# Patient Record
Sex: Female | Born: 1960 | Hispanic: Yes | Smoking: Never smoker
Health system: Southern US, Community
[De-identification: ages and names within clinical notes are randomized; demographics above are authoritative.]

## PROBLEM LIST (undated history)

## (undated) DIAGNOSIS — R7303 Prediabetes: Secondary | ICD-10-CM

## (undated) DIAGNOSIS — K760 Fatty (change of) liver, not elsewhere classified: Secondary | ICD-10-CM

## (undated) HISTORY — PX: APPENDECTOMY: SHX54

## (undated) HISTORY — PX: ABDOMINAL HYSTERECTOMY: SHX81

## (undated) HISTORY — PX: LAPAROSCOPIC HYSTERECTOMY: SHX1926

## (undated) HISTORY — DX: Prediabetes: R73.03

## (undated) HISTORY — PX: LAPAROSCOPIC OOPHERECTOMY: SHX6507

## (undated) HISTORY — DX: Fatty (change of) liver, not elsewhere classified: K76.0

---

## 2004-12-26 ENCOUNTER — Ambulatory Visit: Payer: Self-pay | Admitting: Obstetrics and Gynecology

## 2006-04-25 ENCOUNTER — Ambulatory Visit: Payer: Self-pay | Admitting: Gastroenterology

## 2006-04-25 IMAGING — US US PELV - US TRANSVAGINAL
1 series · 14 of 25 positions shown · non-contrast
Comparison: none

REASON FOR EXAM: epigastric pain, LEFT lower quadrant/LEFT upper quadrant
pain
COMMENTS:

[Series 1: us pelv - us transvaginal · 0.33mm/px · 14 of 37 slices shown]
[im 1/37]
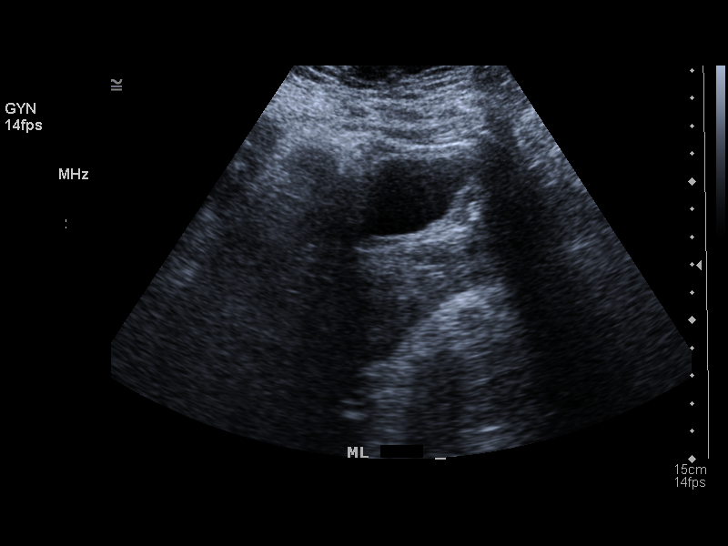
[im 4/37]
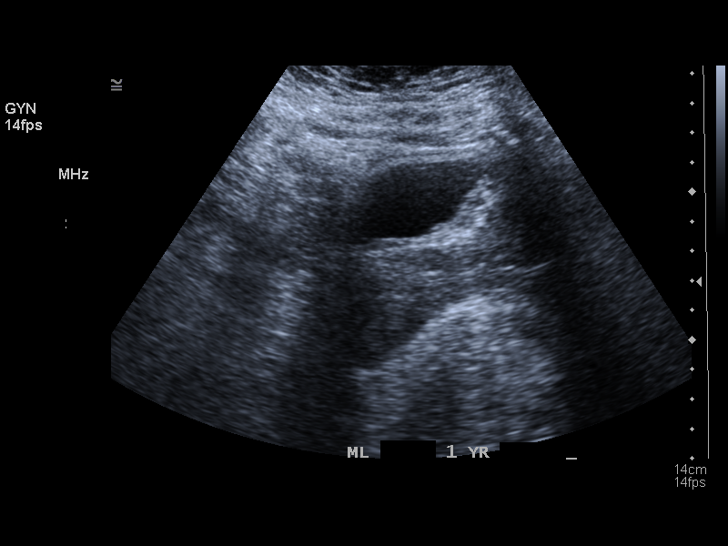
[im 7/37]
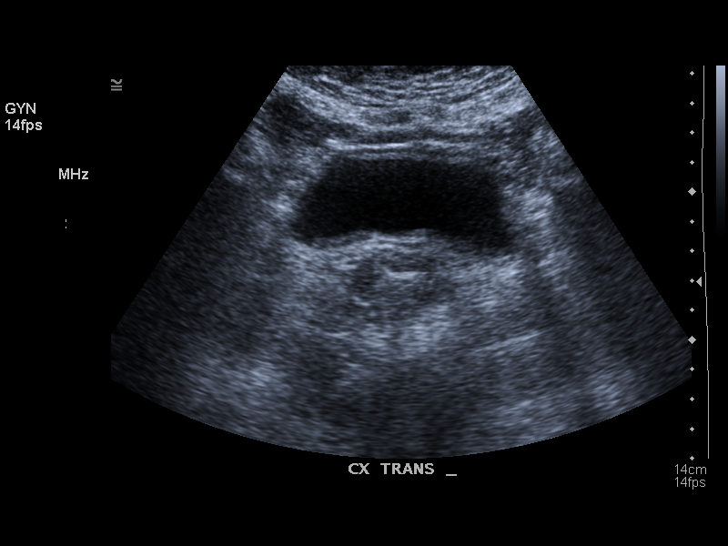
[im 10/37]
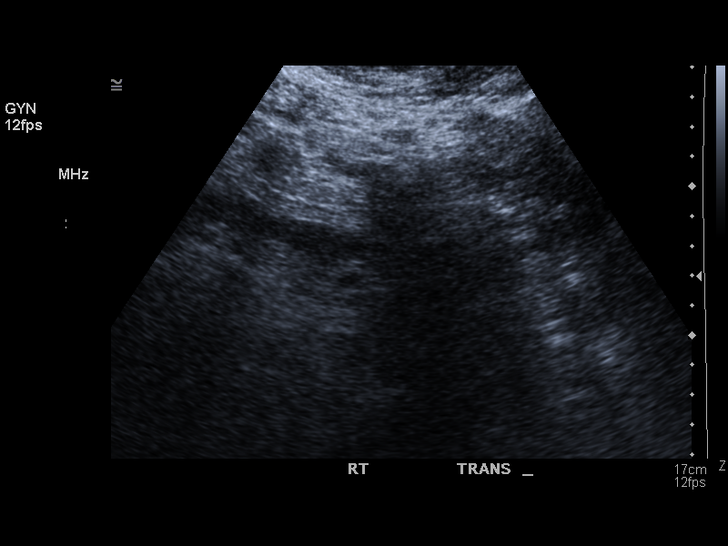
[im 13/37]
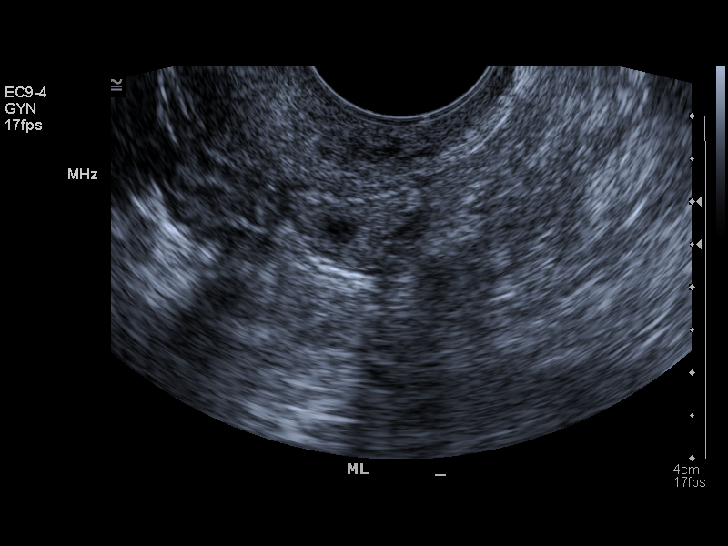
[im 14/37]
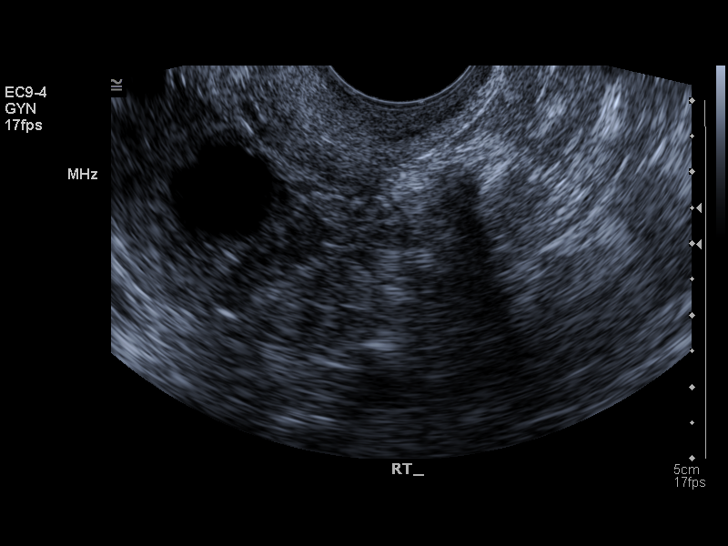
[im 17/37]
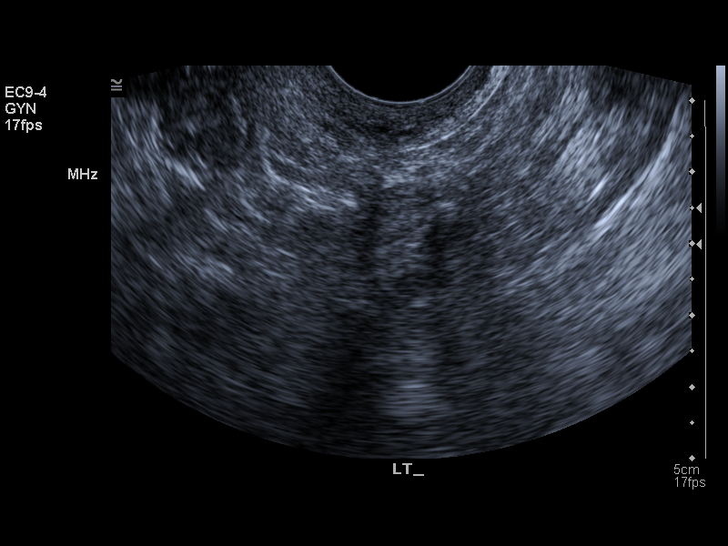
[im 20/37]
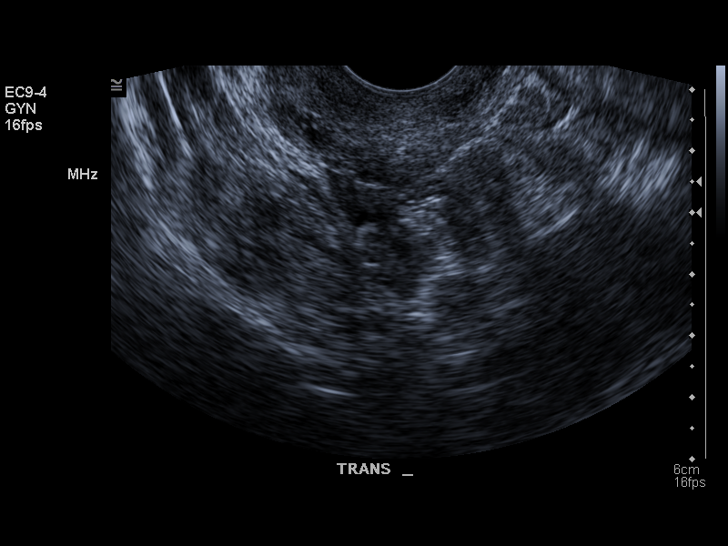
[im 23/37]
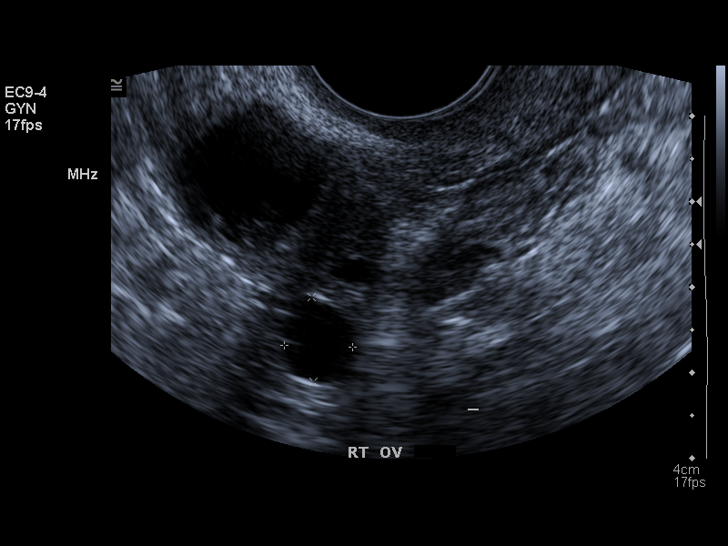
[im 25/37]
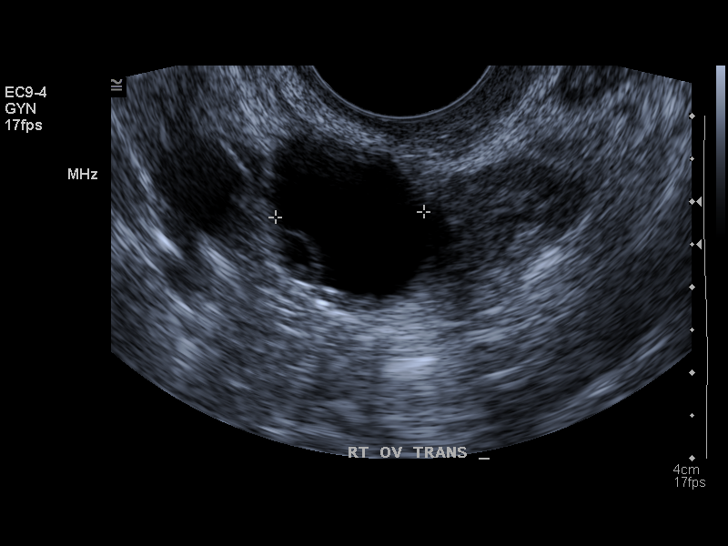
[im 28/37]
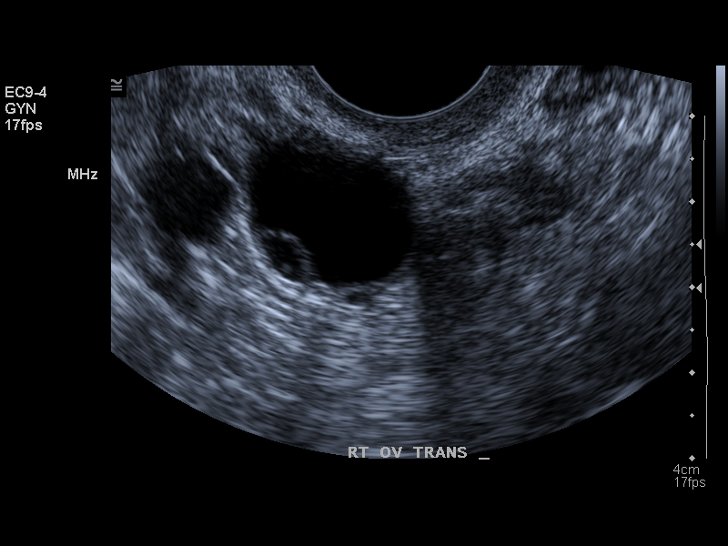
[im 31/37]
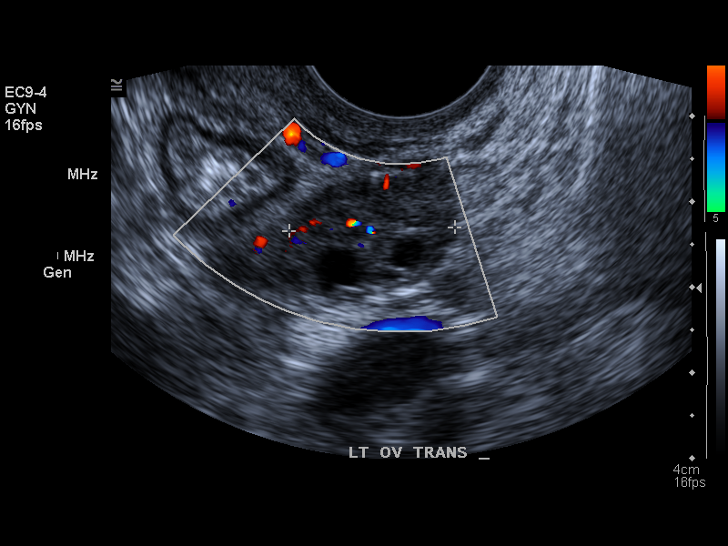
[im 34/37]
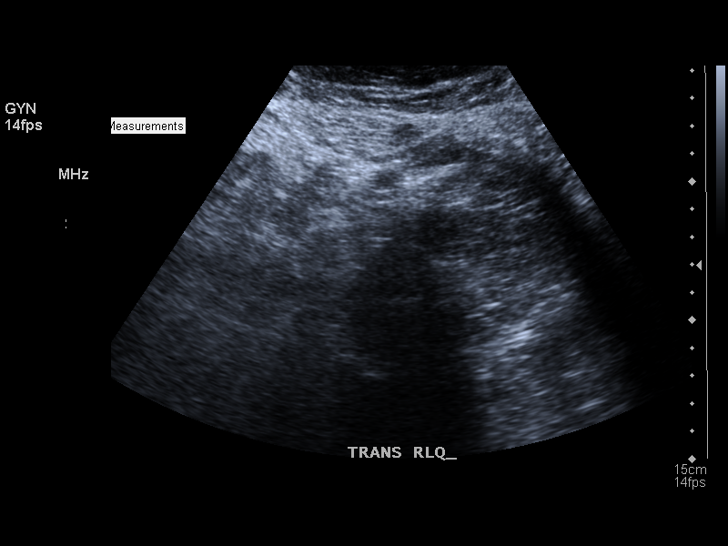
[im 37/37]
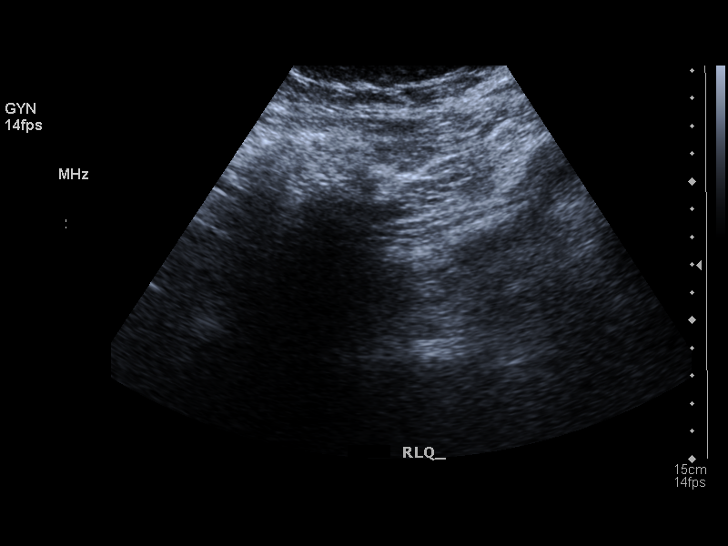

[14 of 25 positions shown; findings below may reference images not displayed]

PROCEDURE:     US  - US PELVIS MASS EXAM  - [DATE] [DATE] [DATE] [DATE]

RESULT:          The uterus is not seen compatible with prior hysterectomy.
The RIGHT ovary measures 3.7 cm at maximum diameter.  Within the RIGHT
ovary, there is noted a septated 1.88 cm cyst and a second smaller cyst
measuring 9 mm at maximum diameter.  The LEFT ovary measures 2.58 cm at
maximum diameter.  A few follicular cysts are seen in the LEFT ovary.  There
is no free fluid observed in the pelvis.
IMPRESSION: 1.     The patient is status post hysterectomy.
2.     There are observed two cysts of the RIGHT ovary as noted above.
3.     There is no ascites.

## 2006-04-25 IMAGING — US ABDOMEN ULTRASOUND
1 series · 14 of 25 positions shown · non-contrast
Comparison: none

REASON FOR EXAM: epigastric pain, LLQ LUQ pain
COMMENTS:

[Series 1: abdomen ultrasound · 0.29mm/px · 14 of 57 slices shown]
[im 1/57]
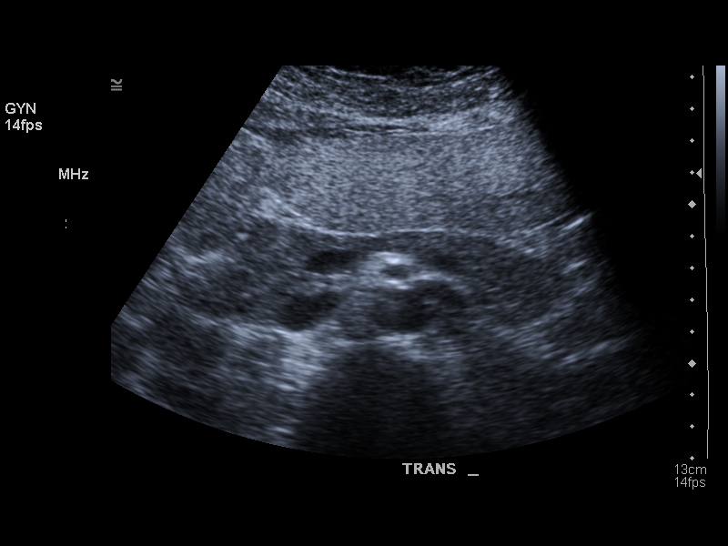
[im 5/57]
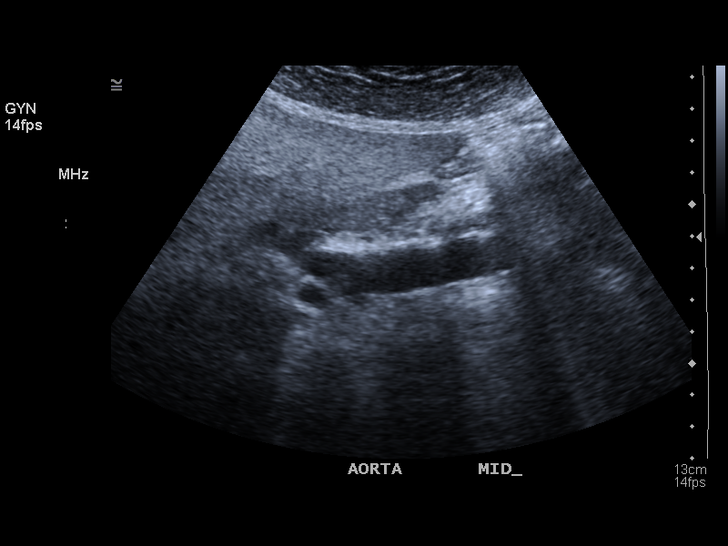
[im 10/57]
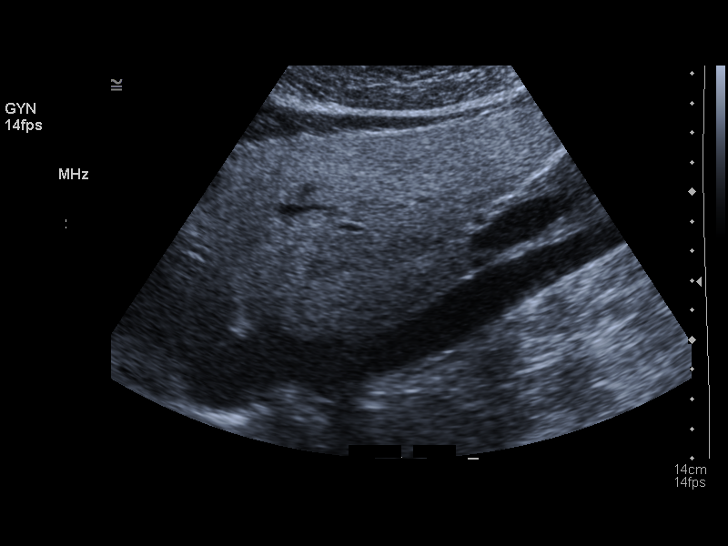
[im 15/57]
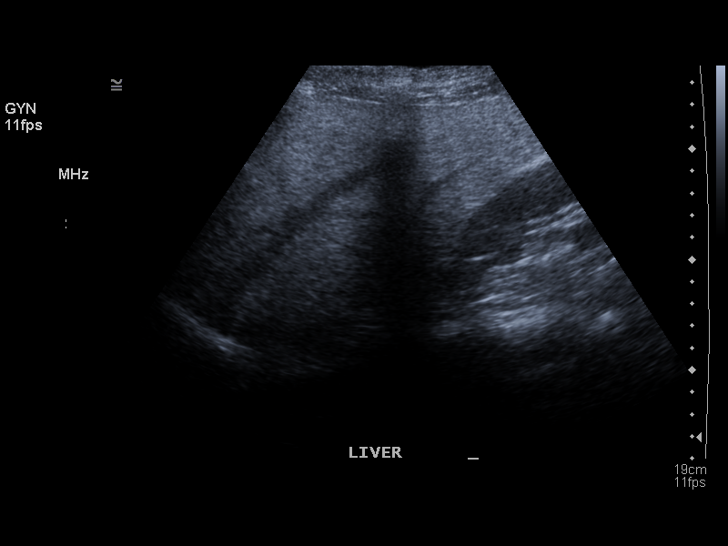
[im 19/57]
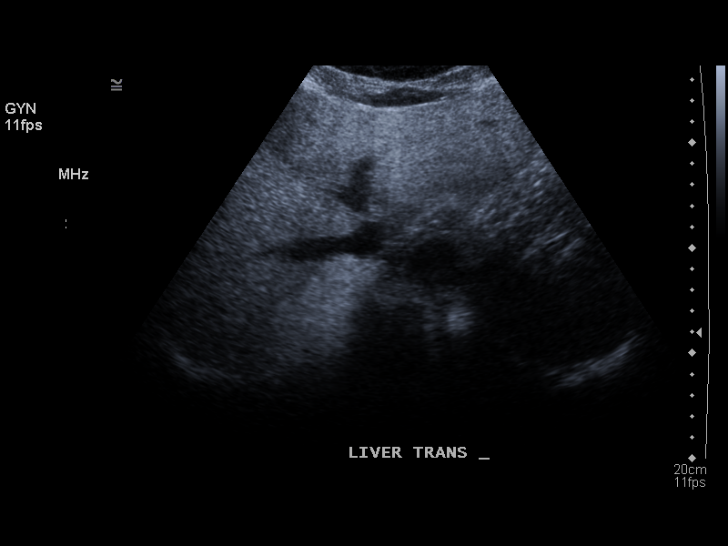
[im 22/57]
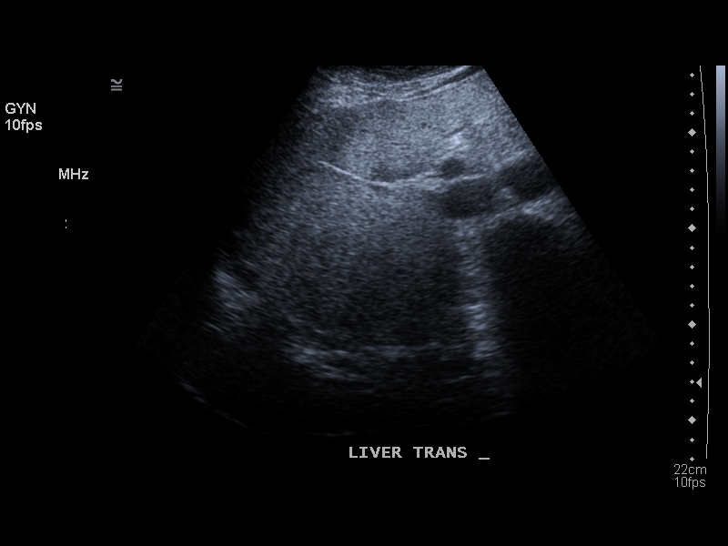
[im 26/57]
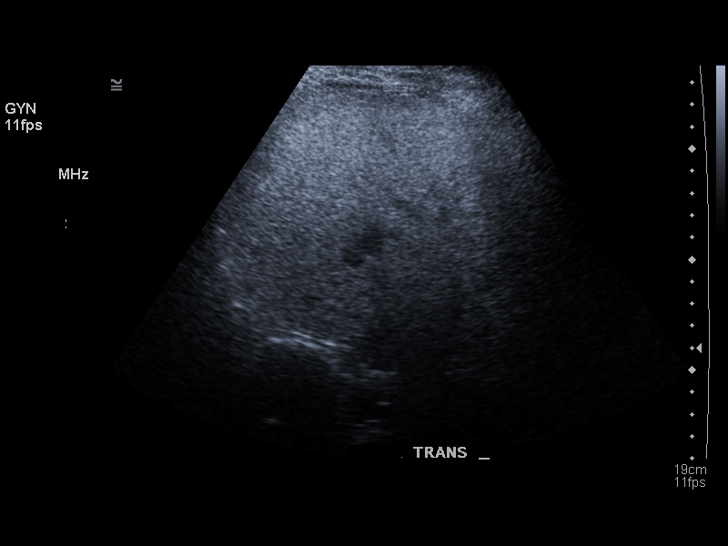
[im 31/57]
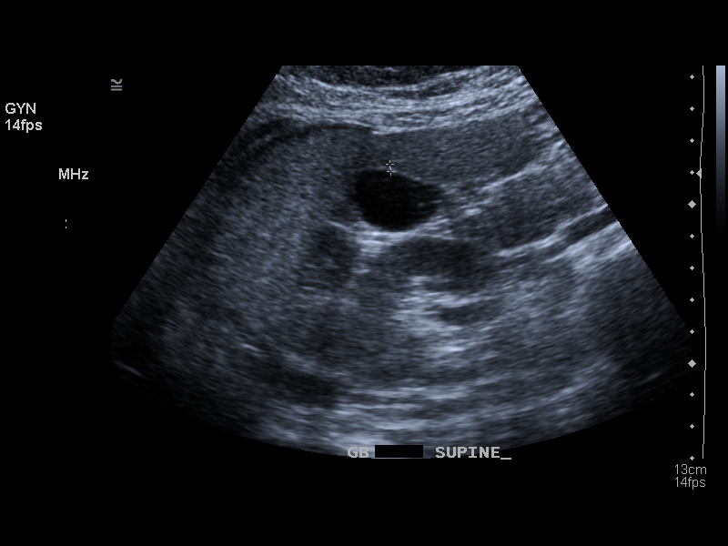
[im 36/57]
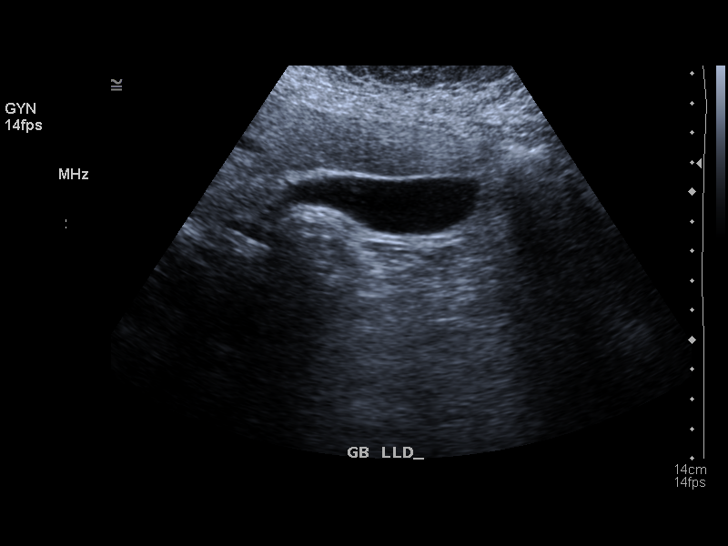
[im 38/57]
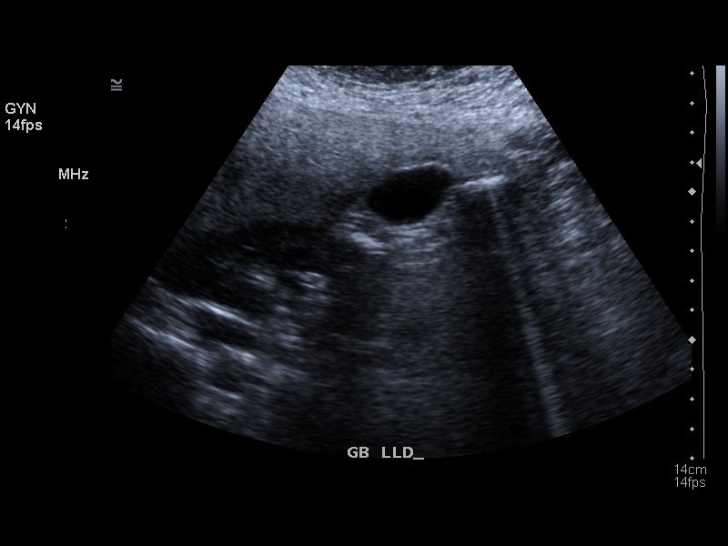
[im 43/57]
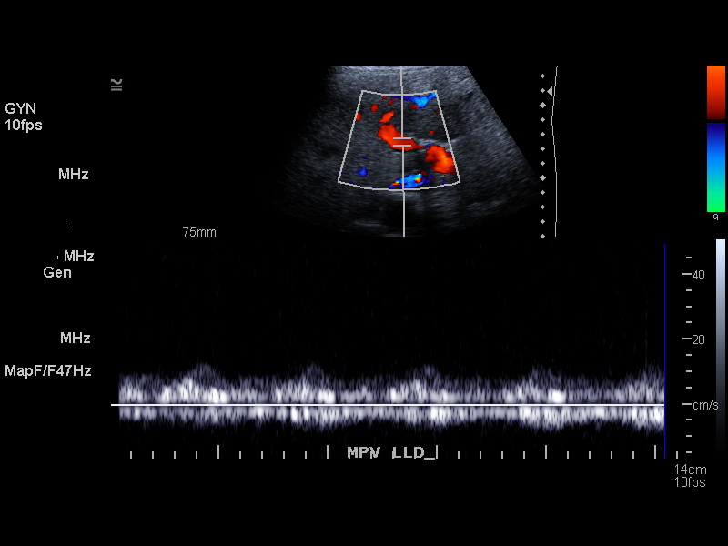
[im 47/57]
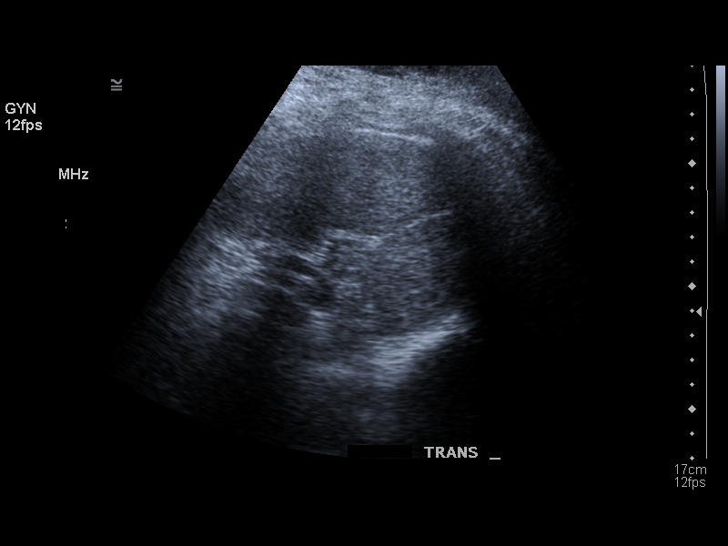
[im 52/57]
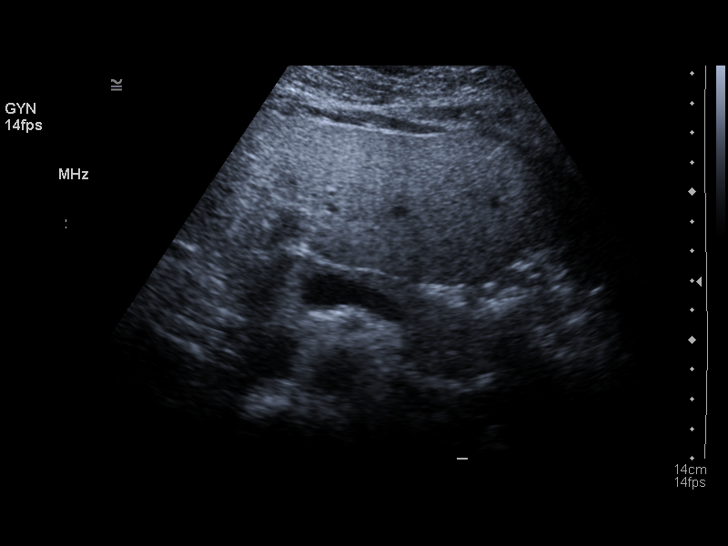
[im 57/57]
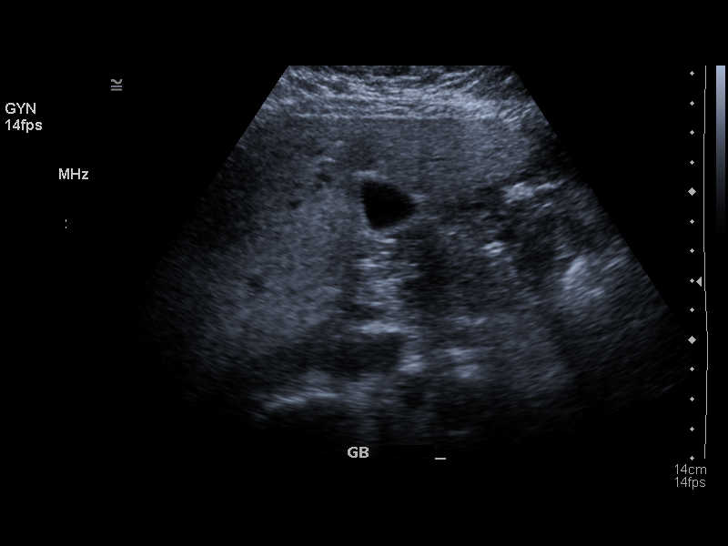

[14 of 25 positions shown; findings below may reference images not displayed]

PROCEDURE:     US  - US ABDOMEN GENERAL SURVEY  - [DATE] [DATE]

RESULT:          The liver, spleen, pancreas, and abdominal aorta are
visualized and are normal in appearance.  No gallstones are seen.  There is
no thickening of the gallbladder wall.  The common bile duct measures 3.7 mm
at maximum diameter.  The kidneys show no hydronephrosis.  There is no
ascites.
IMPRESSION: No significant abnormalities are noted.

## 2006-05-04 ENCOUNTER — Ambulatory Visit: Payer: Self-pay | Admitting: Gastroenterology

## 2008-06-25 ENCOUNTER — Observation Stay: Payer: Self-pay | Admitting: Internal Medicine

## 2008-06-25 ENCOUNTER — Other Ambulatory Visit: Payer: Self-pay

## 2008-06-25 IMAGING — CR DG CHEST 1V PORT
1 series · 1 of 1 positions shown · non-contrast
Comparison: none

REASON FOR EXAM: chest pain - ed [HOSPITAL]
COMMENTS:

PROCEDURE:     DXR - DXR PORTABLE CHEST SINGLE VIEW  - [DATE] [DATE]
RESULT:     The patient has taken a poor inspiration effort. Mild bibasilar
atelectasis is present. Bibasilar pneumonia cannot be excluded.  Follow-up
PA and lateral chest x-rays may prove useful.  The heart size is normal.

[view not recorded]
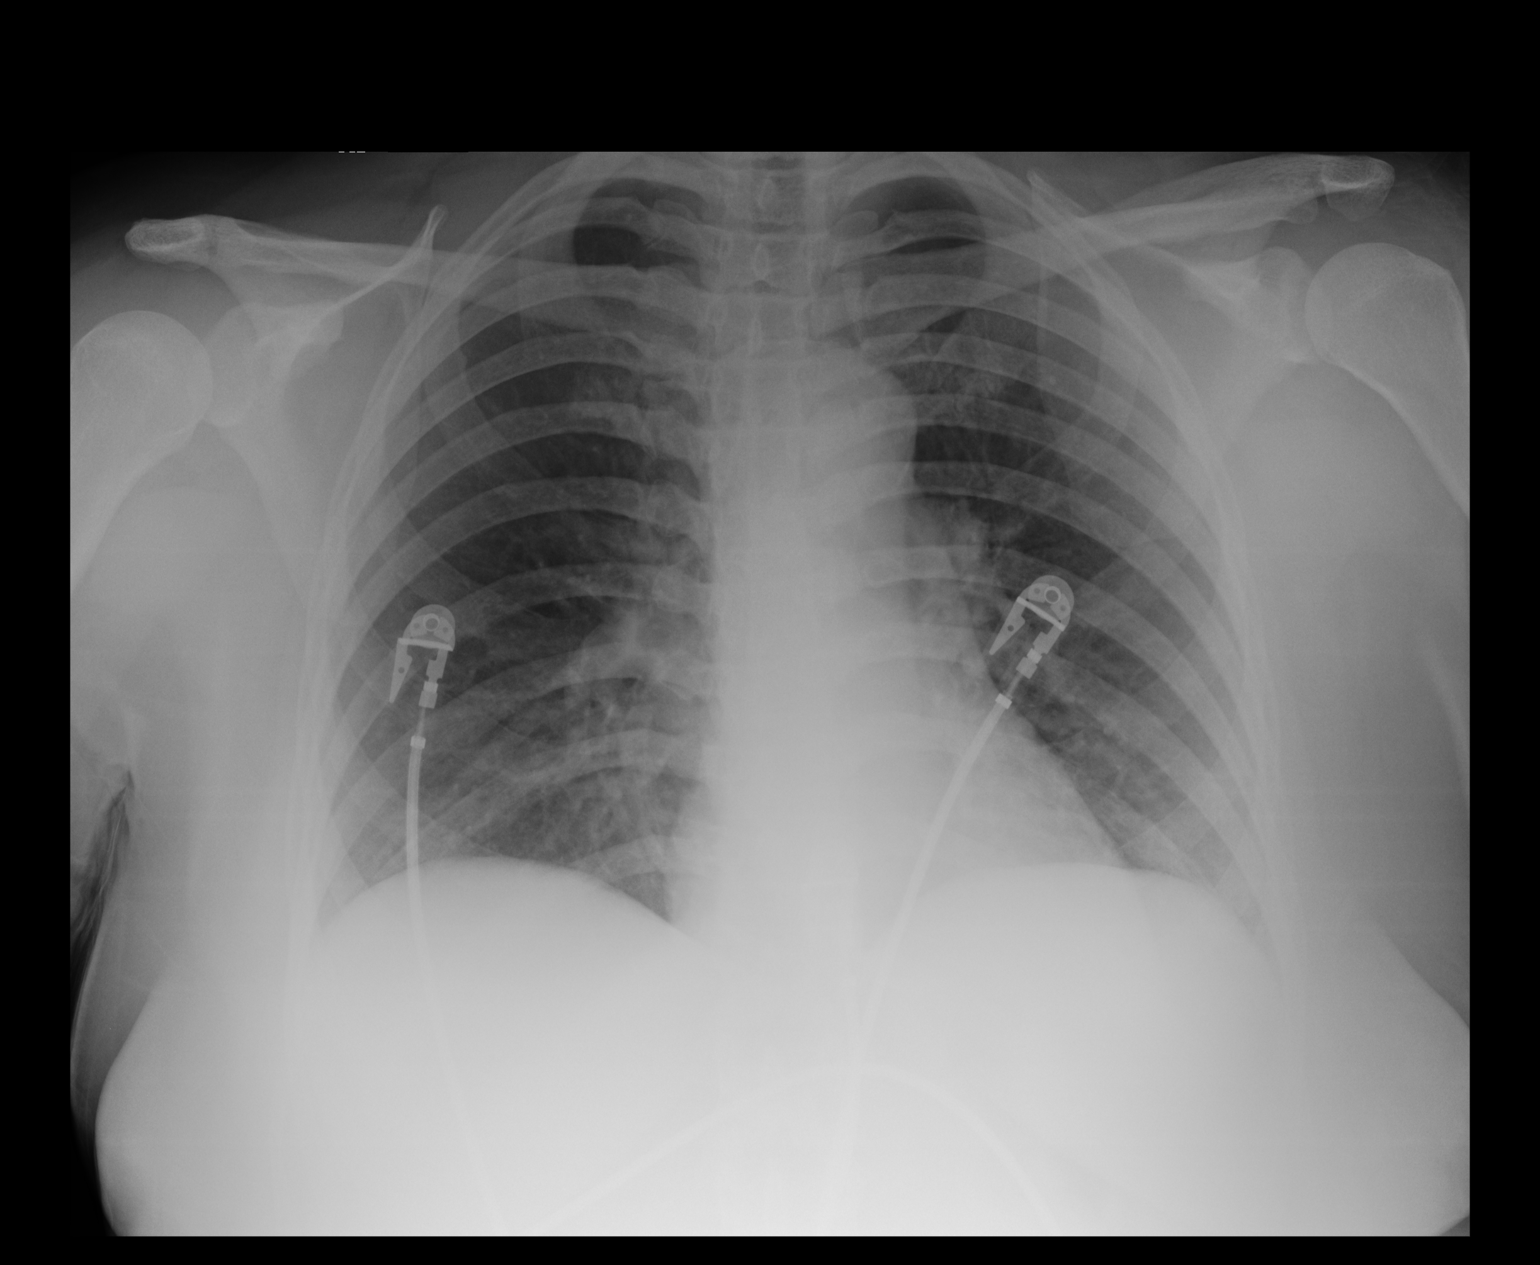

[1 of 1 positions shown; findings below may reference images not displayed]

IMPRESSION: Mild bibasilar atelectasis versus infiltrates.  The patient has taken a poor
inspiration effort. Follow-up chest x-rays may prove useful.

## 2008-06-25 IMAGING — US ABDOMEN ULTRASOUND
1 series · 17 of 25 positions shown · non-contrast
Comparison: none

REASON FOR EXAM: RUQ pain
COMMENTS:

PROCEDURE:     US  - US ABDOMEN GENERAL SURVEY  - [DATE]  [DATE]
RESULT:
HISTORY: RIGHT upper quadrant pain.
COMPARISON STUDIES:  Prior ultrasound of [DATE].

[Series 1: abdomen ultrasound · 17 of 53 slices shown]
[im 1/53]
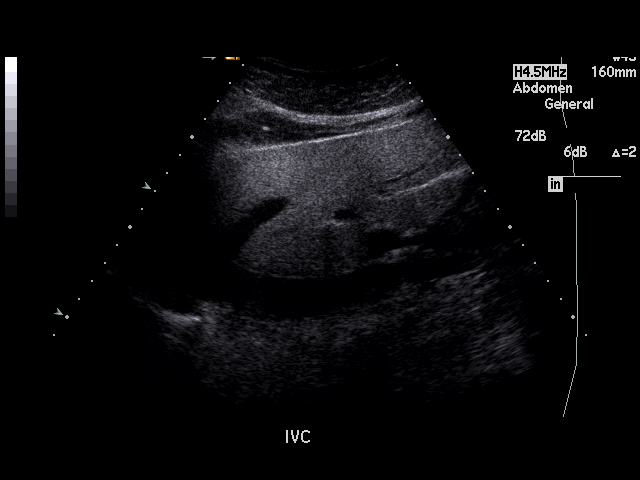
[im 5/53]
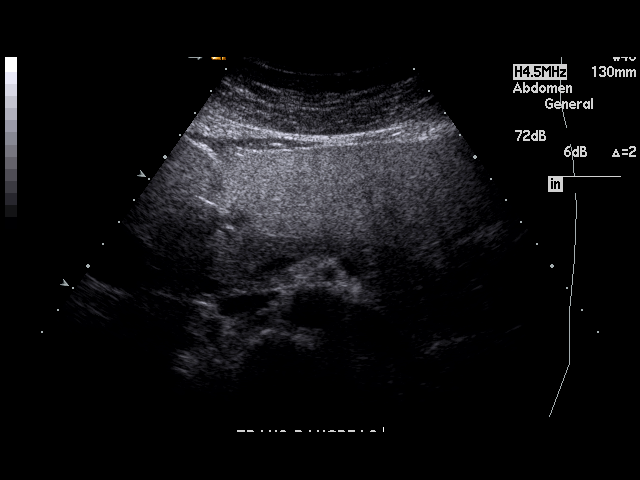
[im 7/53]
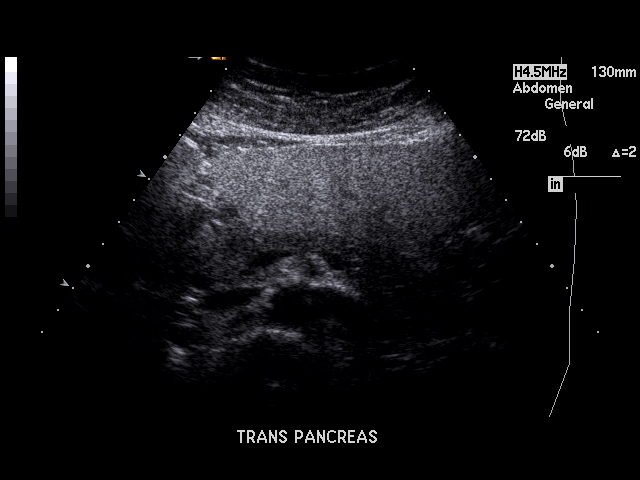
[im 11/53]
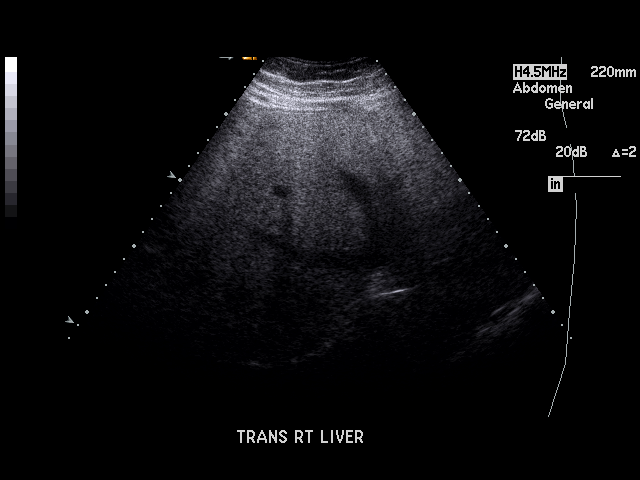
[im 14/53]
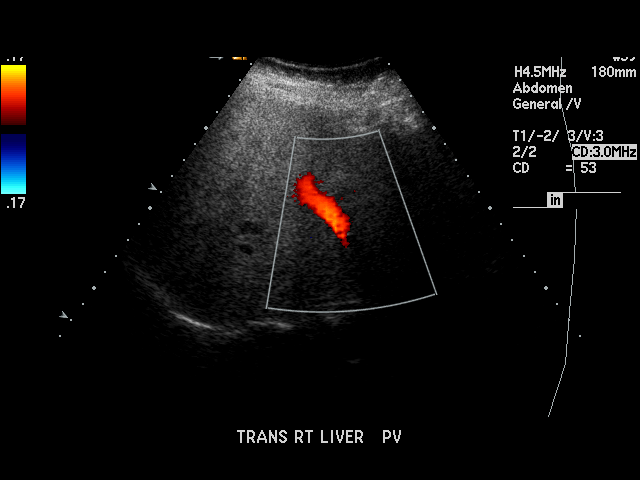
[im 18/53]
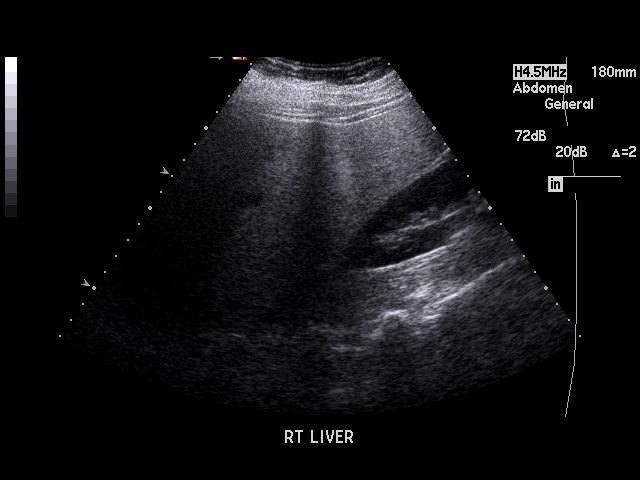
[im 20/53]
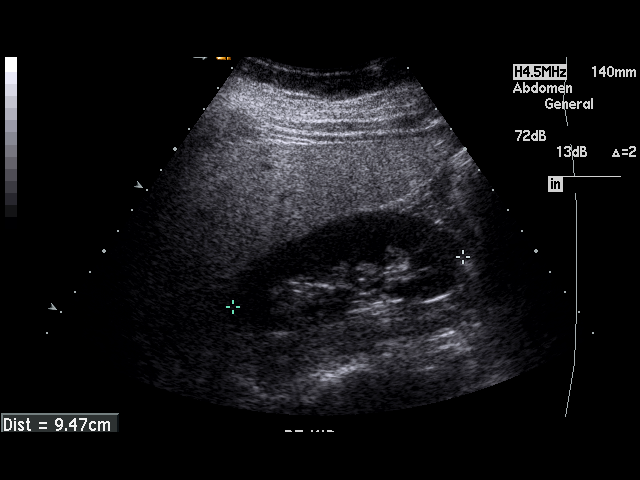
[im 24/53]
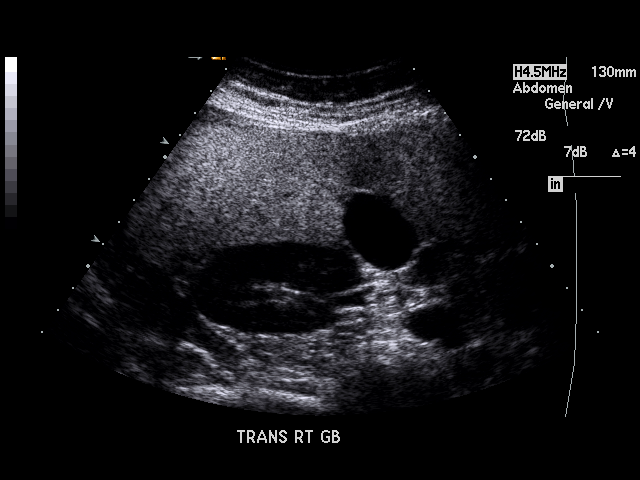
[im 27/53]
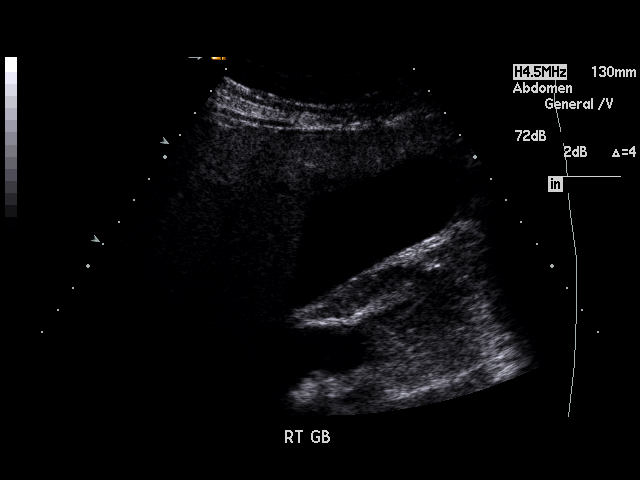
[im 29/53]
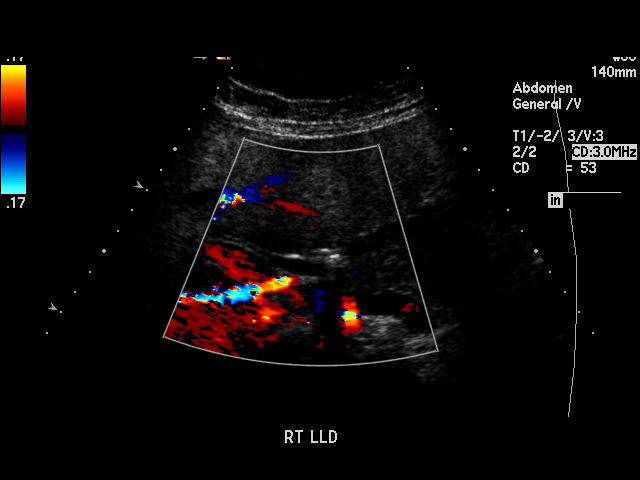
[im 33/53]
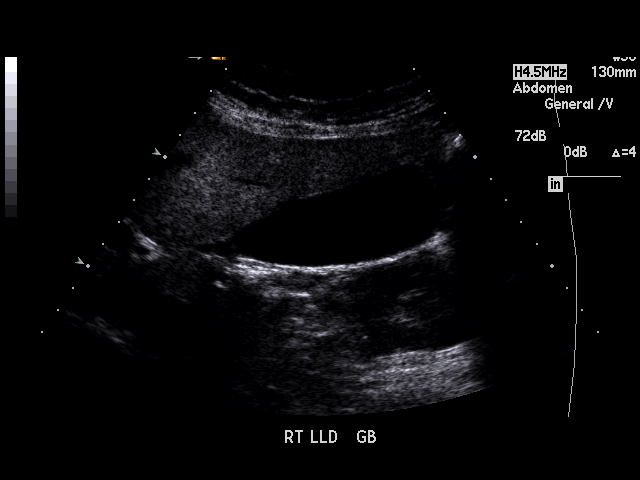
[im 35/53]
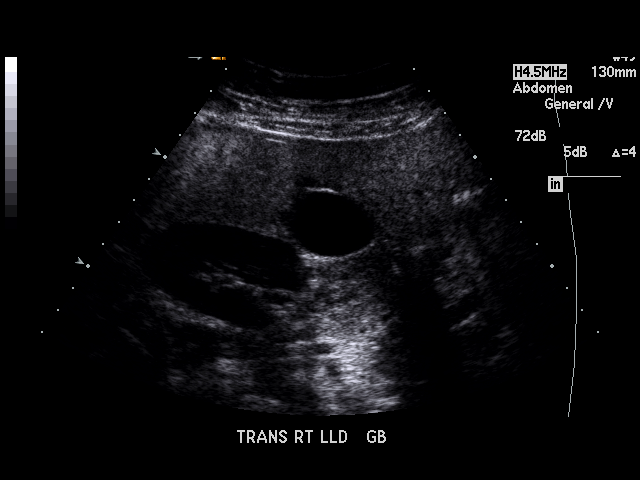
[im 40/53]
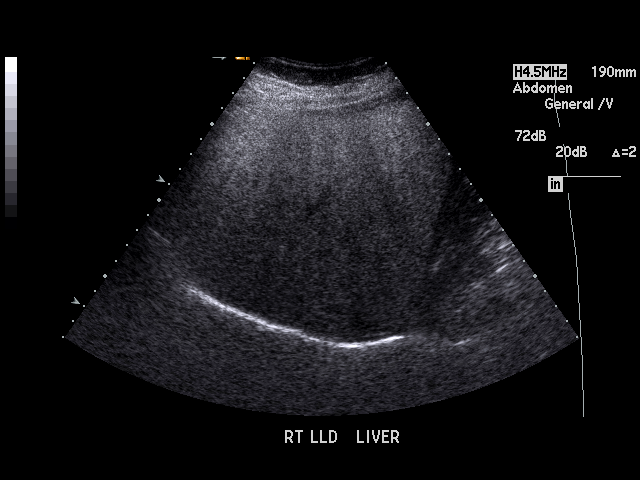
[im 42/53]
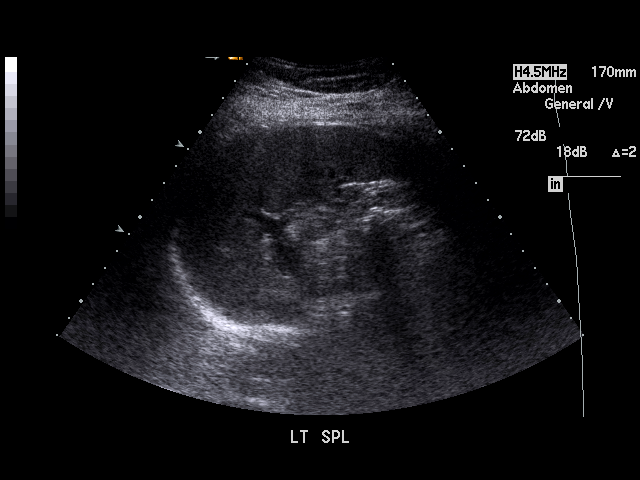
[im 46/53]
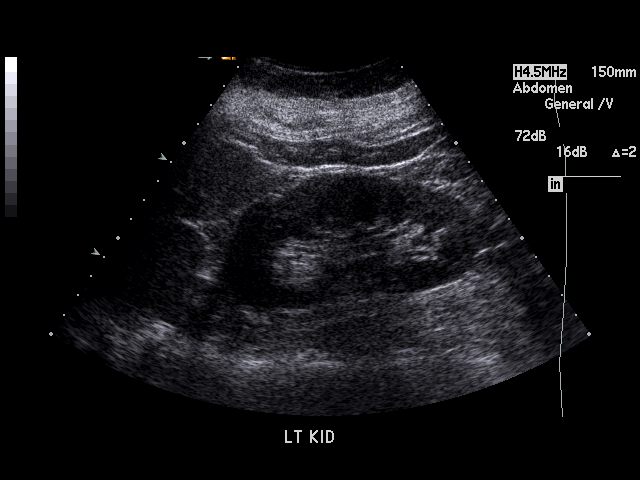
[im 48/53]
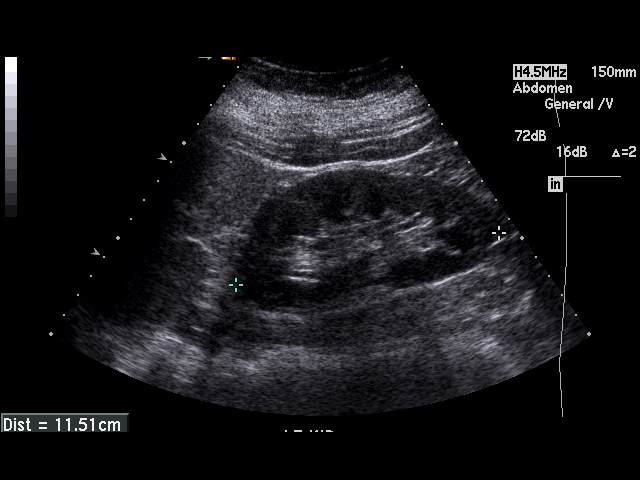
[im 53/53]
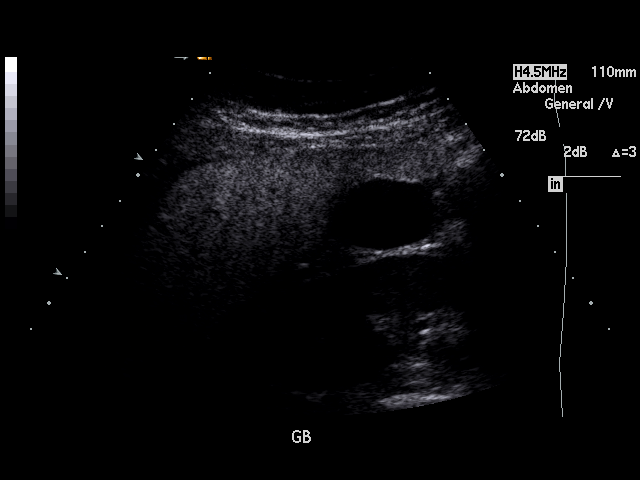

[17 of 25 positions shown; findings below may reference images not displayed]

FINDINGS: Fatty infiltration of the liver is noted.  The portal vein is
patent.  The pancreas is normal.  The gallbladder is normal.  The
gallbladder wall thickness is 1.9 mm.  The common bile duct is 6.4 mm. The
abdominal aorta measures 2 cm.  The RIGHT kidney measures 9.5 cm in maximum
diameter, the LEFT 11.5.  There is  no hydronephrosis.
IMPRESSION: 1.     Fatty infiltration of the liver.
2.     No gallstones.
3.     Common bile duct caliber is normal at 6.4 mm.

## 2008-12-27 ENCOUNTER — Emergency Department: Payer: Self-pay | Admitting: Emergency Medicine

## 2010-02-09 ENCOUNTER — Ambulatory Visit: Payer: Self-pay

## 2010-06-05 ENCOUNTER — Emergency Department (HOSPITAL_COMMUNITY): Admission: EM | Admit: 2010-06-05 | Discharge: 2010-06-05 | Payer: Self-pay | Admitting: Emergency Medicine

## 2014-05-07 ENCOUNTER — Ambulatory Visit: Payer: Self-pay | Admitting: Physician Assistant

## 2015-11-01 DIAGNOSIS — Z90710 Acquired absence of both cervix and uterus: Secondary | ICD-10-CM | POA: Insufficient documentation

## 2015-11-01 DIAGNOSIS — Z90721 Acquired absence of ovaries, unilateral: Secondary | ICD-10-CM | POA: Insufficient documentation

## 2015-11-01 DIAGNOSIS — M5430 Sciatica, unspecified side: Secondary | ICD-10-CM | POA: Insufficient documentation

## 2018-04-11 ENCOUNTER — Ambulatory Visit: Payer: Self-pay

## 2018-05-14 ENCOUNTER — Ambulatory Visit: Payer: Self-pay

## 2018-05-28 ENCOUNTER — Encounter: Payer: Self-pay | Admitting: Adult Health Nurse Practitioner

## 2018-05-28 ENCOUNTER — Ambulatory Visit: Payer: Medicaid Other | Admitting: Adult Health Nurse Practitioner

## 2018-05-28 DIAGNOSIS — K760 Fatty (change of) liver, not elsewhere classified: Secondary | ICD-10-CM

## 2018-05-28 DIAGNOSIS — R252 Cramp and spasm: Secondary | ICD-10-CM

## 2018-05-28 DIAGNOSIS — L659 Nonscarring hair loss, unspecified: Secondary | ICD-10-CM

## 2018-05-28 DIAGNOSIS — R7303 Prediabetes: Secondary | ICD-10-CM

## 2018-05-28 NOTE — Progress Notes (Signed)
  Patient: Carolyn Sampson Female    DOB: March 07, 1961   57 y.o.   MRN: [id number] Visit Date: 05/28/2018  Today's Provider: Staci Acosta, NP   No chief complaint on file.  Subjective:    HPI   New patient here to establish care. Was a patient at [facility].  Taking no rx meds at this time, only vitamins and supplements.   Found that she has fatty liver, pre-DM and high cholesterol. Was told to adhere to strict diet. Has lost 23 lbs.   States that she has been having a problem with cramps in her LE.  Happens when laying down or asleep.  Gets up to stretch prior to going to bed- with minimal relief.  She is wearing a boot at night that she had prior from plantar fascitis.   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   States that she has occasional episodes where she forget where she is going or where the  Turn signal is in the care-only lasts for a split second then returns to normal.   Endorses hair loss and brittle nails.   Allergies not on file Previous Medications   No medications on file    Review of Systems  All other systems reviewed and are negative.   Social History   Tobacco Use  . Smoking status: Never Smoker  . Smokeless tobacco: Never Used  Substance Use Topics  . Alcohol use: Not on file   Objective:   BP 128/75 (BP Location: Left Arm, Patient Position: Sitting)   Pulse 67   Temp 98.6 F (37 C)   Wt 157 lb 3.2 oz (71.3 kg)   Physical Exam  Constitutional: She is oriented to person, place, and time. She appears well-developed and well-nourished.  HENT:  Head: Normocephalic and atraumatic.  Eyes: Pupils are equal, round, and reactive to light. EOM are normal.  Neck: Normal range of motion. Neck supple.  Cardiovascular: Normal rate, regular rhythm and normal heart sounds.  Pulmonary/Chest: Effort normal and breath sounds normal.  Abdominal: Soft. Bowel sounds are normal.  Neurological: She is alert and oriented to person, place,  and time.  Skin: Skin is warm and dry.  Vitals reviewed.       Assessment & Plan:        Muscle cramps:  Continue to stretch.  Increase water intake.  Take teaspoon of mustard for flares.  Will check Mg, K and B12.   Pre-DM:  Will check A1c.  Discussed diet- increasing protein in diet- and adding some good carbs.  Referral to   Will check Vitamin D level for hair loss.   Routine labs ordered. FU in 2 weeks for lab review.   Spanish interpreter used.     Staci Acosta, NP   [facility]

## 2018-06-01 LAB — COMPREHENSIVE METABOLIC PANEL
A/G RATIO: 2.1 (ref 1.2–2.2)
ALT: 13 IU/L (ref 0–32)
AST: 15 IU/L (ref 0–40)
Albumin: 5.1 g/dL (ref 3.5–5.5)
Alkaline Phosphatase: 84 IU/L (ref 39–117)
BUN / CREAT RATIO: 20 (ref 9–23)
BUN: 15 mg/dL (ref 6–24)
Bilirubin Total: 0.8 mg/dL (ref 0.0–1.2)
CO2: 27 mmol/L (ref 20–29)
Calcium: 9.9 mg/dL (ref 8.7–10.2)
Chloride: 102 mmol/L (ref 96–106)
Creatinine, Ser: 0.76 mg/dL (ref 0.57–1.00)
GFR, EST AFRICAN AMERICAN: 101 mL/min/{1.73_m2} (ref >59)
GFR, EST NON AFRICAN AMERICAN: 87 mL/min/{1.73_m2} (ref >59)
GLOBULIN, TOTAL: 2.4 g/dL (ref 1.5–4.5)
Glucose: 90 mg/dL (ref 65–99)
POTASSIUM: 4.6 mmol/L (ref 3.5–5.2)
SODIUM: 145 mmol/L — AB (ref 134–144)
Total Protein: 7.5 g/dL (ref 6.0–8.5)

## 2018-06-01 LAB — CBC
Hematocrit: 37.6 % (ref 34.0–46.6)
Hemoglobin: 12 g/dL (ref 11.1–15.9)
MCH: 30.8 pg (ref 26.6–33.0)
MCHC: 31.9 g/dL (ref 31.5–35.7)
MCV: 97 fL (ref 79–97)
PLATELETS: 197 10*3/uL (ref 150–450)
RBC: 3.89 x10E6/uL (ref 3.77–5.28)
RDW: 13.6 % (ref 12.3–15.4)
WBC: 5.3 10*3/uL (ref 3.4–10.8)

## 2018-06-01 LAB — TSH: TSH: 2.99 u[IU]/mL (ref 0.450–4.500)

## 2018-06-01 LAB — LIPID PANEL
CHOL/HDL RATIO: 6.1 ratio — AB (ref 0.0–4.4)
Cholesterol, Total: 176 mg/dL (ref 100–199)
HDL: 29 mg/dL — ABNORMAL LOW (ref >39)
LDL Calculated: 108 mg/dL — ABNORMAL HIGH (ref 0–99)
TRIGLYCERIDES: 197 mg/dL — AB (ref 0–149)
VLDL Cholesterol Cal: 39 mg/dL (ref 5–40)

## 2018-06-01 LAB — VITAMIN D 1,25 DIHYDROXY
Vitamin D 1, 25 (OH)2 Total: 48 pg/mL
Vitamin D2 1, 25 (OH)2: <10 pg/mL
Vitamin D3 1, 25 (OH)2: 48 pg/mL

## 2018-06-01 LAB — B12 AND FOLATE PANEL
FOLATE: 17.4 ng/mL (ref >3.0)
VITAMIN B 12: 566 pg/mL (ref 232–1245)

## 2018-06-01 LAB — HEMOGLOBIN A1C
ESTIMATED AVERAGE GLUCOSE: 77 mg/dL
HEMOGLOBIN A1C: 4.3 % — AB (ref 4.8–5.6)

## 2018-06-01 LAB — MAGNESIUM: Magnesium: 2.6 mg/dL — ABNORMAL HIGH (ref 1.6–2.3)

## 2018-06-11 ENCOUNTER — Ambulatory Visit: Payer: Medicaid Other

## 2018-10-15 ENCOUNTER — Ambulatory Visit: Payer: Medicaid Other | Admitting: Adult Health Nurse Practitioner

## 2018-10-15 VITALS — BP 115/71 | HR 74 | Temp 98.4°F | Ht <= 58 in | Wt 159.9 lb

## 2018-10-15 DIAGNOSIS — K760 Fatty (change of) liver, not elsewhere classified: Secondary | ICD-10-CM

## 2018-10-15 DIAGNOSIS — J069 Acute upper respiratory infection, unspecified: Secondary | ICD-10-CM

## 2018-10-15 NOTE — Patient Instructions (Signed)
Guaifenesin oral solution and syrup Qu es este medicamento? La GUAIFENESINA es un expectorante. Ayuda a aflojar la mucosidad y hacer que la tos sea ms productiva. Este medicamento se [place] para tratar la tos causada por resfros o gripe. No est diseada para tratar la tos crnica causada por el fumar, asma, enfisema, insuficiencia cardiaca. Este medicamento puede ser utilizado para otros usos; si tiene alguna pregunta consulte con su proveedor de atencin mdica o con su farmacutico. MARCAS COMUNES: Altarussin, Altorant, Cough, Diabetic Tussin EX, Diabetic Tussin Mucus Relief, ElixSure EX, Ganidin NR, GERI-TUSSIN, Guiatuss, Iophen-NR, Miltuss EX, Mucinex Children's, Mucus + Chest Congestion, Mucus Relief Children's, Naldecon, Organidin NR, Q-Tussin, Robafen, Robafen Congestion, Robitussin, Robitussin Mucus + Chest Congestion, Scot-Tussin Expectorant, Siltussin DAS, Siltussin Diabetic DAS-Na, Siltussin SA Qu le debo informar a mi profesional de la salud antes de tomar este medicamento? Necesita saber si usted presenta alguno de los siguientes problemas o situaciones: -diabetes -fiebre -enfermedad renal -una reaccin alrgica o inusual a la guaifenesina, a otros medicamentos, alimentos, colorantes o conservantes -si est embarazada o buscando quedar embarazada -si est amamantando a un beb Cmo debo utilizar este medicamento? Tome este medicamento por va oral. Siga las instrucciones de la etiqueta del [place]. Utilice una cuchara o un recipiente dosificador especialmente marcado para medir su dosis. Las cucharas domsticas no son exactas. Tome sus dosis a intervalos regulares. No tome su medicamento con una frecuencia mayor a la indicada. Hable con su pediatra para informarse acerca del uso de este medicamento en nios. Puede requerir atencin especial. Sobredosis: Pngase en contacto inmediatamente con un centro toxicolgico o una sala de urgencia si usted cree que haya tomado  demasiado medicamento. ATENCIN: [employer] es solo para usted. No comparta este medicamento con nadie. Qu sucede si me olvido de una dosis? Si olvida una dosis, tmela lo antes posible. Si es casi la hora de la prxima dosis, tome slo esa dosis. No tome dosis adicionales o dobles. Qu puede interactuar con este medicamento? No se esperan interacciones. Puede ser que esta lista no menciona todas las posibles interacciones. Informe a su profesional de [employer] Home	[place] de [employer] Corporation productos a base de hierbas, medicamentos de [place] o suplementos nutritivos que est tomando. Si usted fuma, consume bebidas alcohlicas o si utiliza drogas ilegales, indqueselo tambin a su profesional de KB Home	[place]. Algunas sustancias pueden interactuar con su medicamento. A qu debo estar atento al usar Coca-Cola? En caso de tos, no se trate durante ms de 1 semana sin consultar a su mdico o a su profesional de KB Home	[place]. Si tambin tiene fiebre alta, erupcin cutnea, dolor de cabeza persistente o dolor de garganta, consulte a su mdico. Para obtener Levi Strauss, beba 6 a 8 vasos de agua diariamente mientras tome este medicamento. Qu efectos secundarios puedo tener al [employer] este medicamento? Efectos secundarios que debe informar a su mdico o a Barrister's clerk de la salud tan pronto como sea posible: -reacciones alrgicas como erupcin cutnea, picazn o urticarias, hinchazn de la cara, labios o lengua Efectos secundarios que, por lo general, no requieren atencin mdica (debe informarlos a su mdico o a Barrister's clerk de la salud si persisten o si son molestos): -mareos -dolor de cabeza -Higher education careers adviser Puede ser que esta lista no menciona todos los posibles efectos secundarios. Comunquese a su mdico por asesoramiento mdico Humana Inc. Usted puede informar los efectos secundarios a la FDA por telfono al 1-800-FDA-1088. Dnde debo guardar mi medicina? Mantngala  fuera del alcance de los  nios. Gurdela a temperatura ambiente, entre 20 y 18 grados C (57 y 58 grados F). No la congele. Mantenga el envase bien cerrado. Deseche todo el medicamento que no haya utilizado, despus de la fecha de vencimiento. ATENCIN: Este folleto es un resumen. Puede ser que no cubra toda la posible informacin. Si usted tiene preguntas acerca de esta medicina, consulte con su mdico, su farmacutico o su profesional de Technical sales engineer.  2019 Elsevier/Gold Standard (2014-11-10 00:00:00)   Infeccin de las vas respiratorias superiores, en adultos Upper Respiratory Infection, Adult Una infeccin de las vas respiratorias superiores (IVRS) afecta la nariz, la garganta y las vas respiratorias superiores. Las IVRS son causadas por microbios (virus). El tipo ms comn de IVRS es el resfro comn. Las IVRS no se curan con medicamentos, pero hay ciertas cosas que puede hacer en su casa para aliviar los sntomas. Una IVRS suele mejorar en el transcurso de 7 a 10 das. Siga estas indicaciones en su casa: Actividad  Descanse todo lo que sea necesario.  Si tiene fiebre, Principal Financial, sin ir al [place] o a la escuela, hasta que ya no tenga fiebre, o hasta que el mdico le indique que puede regresar al Mat Carne o a la escuela. ? Futures trader en su casa hasta que ya no pueda propagar (contagiar) la infeccin. ? Es posible que el Viacom indique que use una mascarilla para tener menos riesgo de propagar la infeccin. Para aliviar los sntomas  [place] grgaras con una mezcla de agua y sal 3 o 4veces al da, o cuando sea necesario. Para preparar la mezcla de agua y sal, disuelva totalmente de media a 1cucharadita de sal en 1taza de agua tibia.  Use un humidificador de aire fro para agregar humedad al aire. Esto puede ayudarlo a que respire mejor. Qu debe comer y beber   Beba suficiente lquido para mantener la orina de color amarillo plido.  Tome sopas y caldos  transparentes. Instrucciones generales   Delphi de venta libre y los recetados solamente como se lo haya indicado el mdico. Estos incluyen medicamentos para el resfro, para bajar la fiebre y antitusivos.  No consuma ningn producto que contenga nicotina o tabaco. Esto incluye cigarrillos y cigarrillos electrnicos. Si necesita ayuda para dejar de fumar, consulte al mdico.  Evite estar cerca de personas que fuman (evite el humo ambiental de tabaco).  Asegrese de vacunarse regularmente y recibir la vacuna contra la gripe todos los [place].  Concurra a todas las visitas de seguimiento como se lo haya indicado el mdico. Esto es importante. Cmo evitar la propagacin de la infeccin a Corporate investment banker las manos frecuentemente con agua y [place]. Use un desinfectante para manos si no dispone de [place].  Evite tocarse la boca, la cara, los ojos o la [place].  Tosa o estornude en un pauelo de papel o sobre su manga o codo. No tosa o estornude al aire ni se cubra la boca o la nariz con la [place]. Comunquese con un mdico si:  Siente que empeora o que no mejora.  Tiene alguno de estos sntomas: ? Cristy Hilts. ? Escalofros. ? Mucosidad color marrn o roja en la nariz. ? Lquido amarillento o amarronado (Psychiatric nurse de la Lawyer. ? Dolor en la cara, especialmente al inclinarse hacia adelante. ? Ganglios del cuello inflamados. ? Dolor al tragar. ? Zonas blancas en la parte de atrs de la garganta. Solicite ayuda de inmediato si:  La falta de  aire empeora.  Los siguientes sntomas son muy intensos o constantes: ? Dolor de [place]. ? Dolor de odo. ? Dolor en la frente, detrs de los ojos y por encima de los pmulos (dolor sinusal). ? Tourist information centre manager.  Tiene una enfermedad pulmonar prolongada (crnica) junto con cualquiera de estos sntomas: ? Sibilancias. ? Tos prolongada. ? Tos con Carma Lair. ? Cambio en la mucosidad habitual.  Presenta rigidez en el  cuello.  Tiene cambios en: ? La visin. ? La audicin. ? El razonamiento. ? El [place] de nimo. Resumen  Una infeccin de las vas respiratorias superiores (IVRS) es causada por un microbio llamado virus. El tipo ms comn de IVRS es el resfro comn.  Una IVRS suele mejorar en el transcurso de 7 a 10 das.  Tome los medicamentos de venta libre y los recetados solamente como se lo haya indicado el mdico. Esta informacin no tiene Marine scientist el consejo del mdico. Asegrese de hacerle al mdico cualquier pregunta que tenga. Document Released: 02/20/2011 Document Revised: 07/20/2017 Document Reviewed: 07/20/2017 Elsevier Interactive Patient Education  2019 [employer].

## 2018-10-15 NOTE — Progress Notes (Signed)
  Patient: Carolyn Sampson Female    DOB: 1961-09-07   58 y.o.   MRN: [id number] Visit Date: 10/15/2018  Today's Provider: Staci Acosta, NP   Chief Complaint  Patient presents with  . Follow-up    Wants labs to check liver; Has had cough and flu like symptoms for about a weeks, claims to be improving   Subjective:    HPI     Not on File Previous Medications   No medications on file    Review of Systems  All other systems reviewed and are negative.   Social History   Tobacco Use  . Smoking status: Never Smoker  . Smokeless tobacco: Never Used  Substance Use Topics  . Alcohol use: Not on file   Objective:   BP 115/71 (BP Location: Left Arm, Patient Position: Sitting, Cuff Size: Normal)   Pulse 74   Temp 98.4 F (36.9 C) (Oral)   Ht 4\' 10"  (1.473 m)   Wt 159 lb 14.4 oz (72.5 kg)   LMP 10/16/2003 (Within Years)   BMI 33.42 kg/m   Physical Exam Vitals signs reviewed.  Constitutional:      Appearance: Normal appearance.  HENT:     Head: Normocephalic and atraumatic.     Nose:     Right Sinus: No maxillary sinus tenderness or frontal sinus tenderness.     Left Sinus: No maxillary sinus tenderness or frontal sinus tenderness.     Mouth/Throat:     Mouth: Mucous membranes are moist.  Cardiovascular:     Rate and Rhythm: Normal rate and regular rhythm.  Pulmonary:     Effort: Pulmonary effort is normal.     Breath sounds: Normal breath sounds.  Lymphadenopathy:     Cervical: Cervical adenopathy present.  Neurological:     Mental Status: She is alert.         Assessment & Plan:     Supportive care for URI.  OTC cough syrup.  Adenopathy to be self-limiting secondary to recent URI.    FU PRN for cough or if persistent sore throat.   Reviewed labs- stable LDL slightly elevated.  FU 6 months for routine visit.    Staci Acosta, NP   [facility]

## 2019-04-08 ENCOUNTER — Other Ambulatory Visit (HOSPITAL_BASED_OUTPATIENT_CLINIC_OR_DEPARTMENT_OTHER): Payer: Self-pay | Admitting: Obstetrics & Gynecology

## 2019-04-08 DIAGNOSIS — Z1231 Encounter for screening mammogram for malignant neoplasm of breast: Secondary | ICD-10-CM

## 2019-04-15 ENCOUNTER — Ambulatory Visit: Payer: Medicaid Other

## 2019-05-28 ENCOUNTER — Other Ambulatory Visit: Payer: Self-pay

## 2019-05-28 DIAGNOSIS — Z20822 Contact with and (suspected) exposure to covid-19: Secondary | ICD-10-CM

## 2019-05-29 LAB — NOVEL CORONAVIRUS, NAA: SARS-CoV-2, NAA: NOT DETECTED

## 2020-03-03 ENCOUNTER — Ambulatory Visit: Payer: Medicaid Other

## 2020-10-16 ENCOUNTER — Other Ambulatory Visit: Payer: Medicaid Other

## 2020-10-16 DIAGNOSIS — Z20822 Contact with and (suspected) exposure to covid-19: Secondary | ICD-10-CM | POA: Diagnosis not present

## 2020-10-19 LAB — NOVEL CORONAVIRUS, NAA: SARS-CoV-2, NAA: DETECTED — AB

## 2021-02-23 DIAGNOSIS — M25531 Pain in right wrist: Secondary | ICD-10-CM | POA: Diagnosis not present

## 2021-02-23 DIAGNOSIS — R200 Anesthesia of skin: Secondary | ICD-10-CM | POA: Diagnosis not present

## 2021-02-23 DIAGNOSIS — R202 Paresthesia of skin: Secondary | ICD-10-CM | POA: Diagnosis not present

## 2021-02-23 DIAGNOSIS — G5601 Carpal tunnel syndrome, right upper limb: Secondary | ICD-10-CM | POA: Diagnosis not present

## 2021-04-02 ENCOUNTER — Emergency Department
Admission: EM | Admit: 2021-04-02 | Discharge: 2021-04-02 | Disposition: A | Discharge status: Home / Self Care | Payer: 59 | Attending: Emergency Medicine | Admitting: Emergency Medicine

## 2021-04-02 ENCOUNTER — Other Ambulatory Visit: Payer: Self-pay

## 2021-04-02 ENCOUNTER — Emergency Department: Payer: Medicaid Other | Attending: Physician Assistant

## 2021-04-02 DIAGNOSIS — Y9389 Activity, other specified: Secondary | ICD-10-CM | POA: Diagnosis not present

## 2021-04-02 DIAGNOSIS — S60052A Contusion of left little finger without damage to nail, initial encounter: Secondary | ICD-10-CM | POA: Diagnosis not present

## 2021-04-02 DIAGNOSIS — S6992XA Unspecified injury of left wrist, hand and finger(s), initial encounter: Secondary | ICD-10-CM | POA: Diagnosis present

## 2021-04-02 DIAGNOSIS — W208XXA Other cause of strike by thrown, projected or falling object, initial encounter: Secondary | ICD-10-CM | POA: Diagnosis not present

## 2021-04-02 IMAGING — DX DG FINGER LITTLE 2+V*L*
3 series · 3 of 3 positions shown · non-contrast
Comparison: None.

CLINICAL DATA: Pain. Swelling. Work related injury at [HOSPITAL] in the
kitchen. Heavy pot fell on finger 1.5 days ago.

EXAM:
LEFT LITTLE FINGER 2+V

[finger ap]
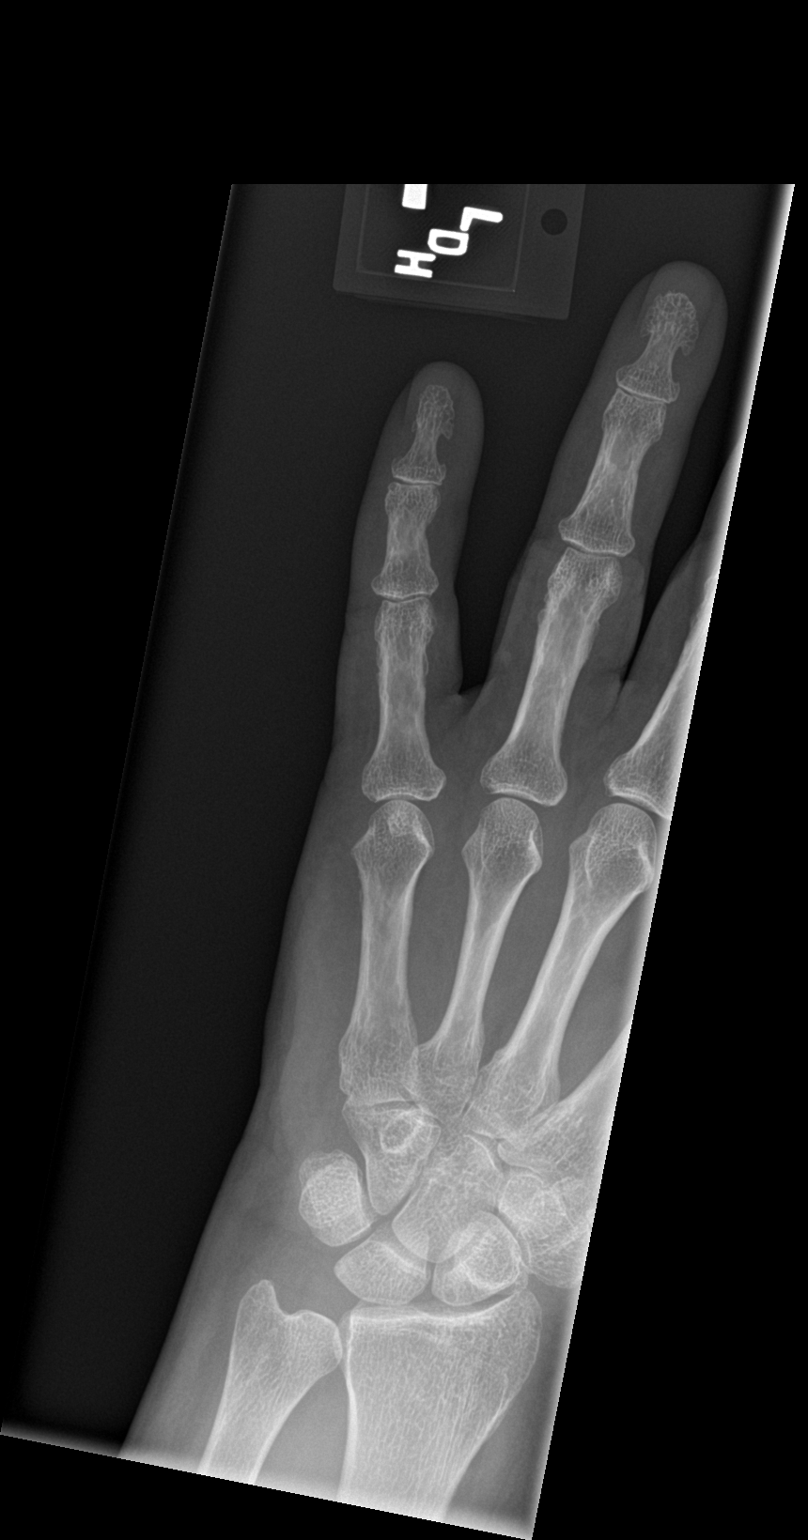

[finger obl]
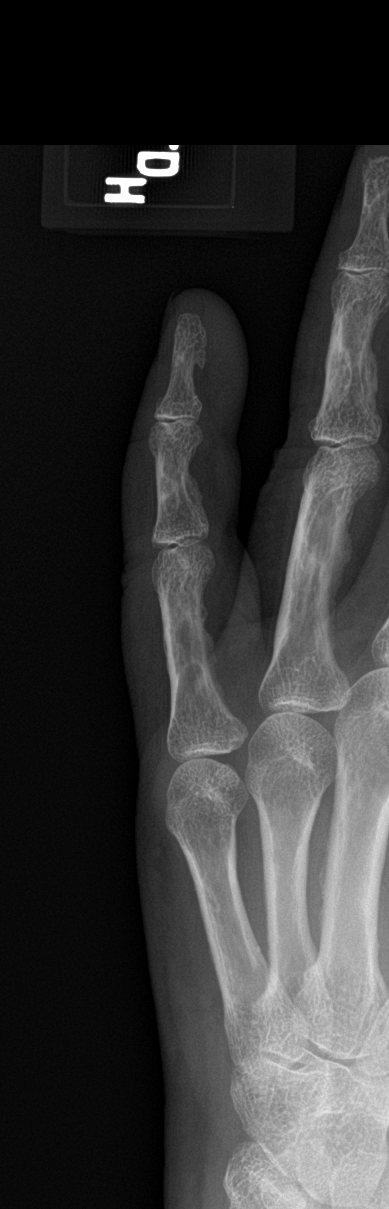

[finger lat]
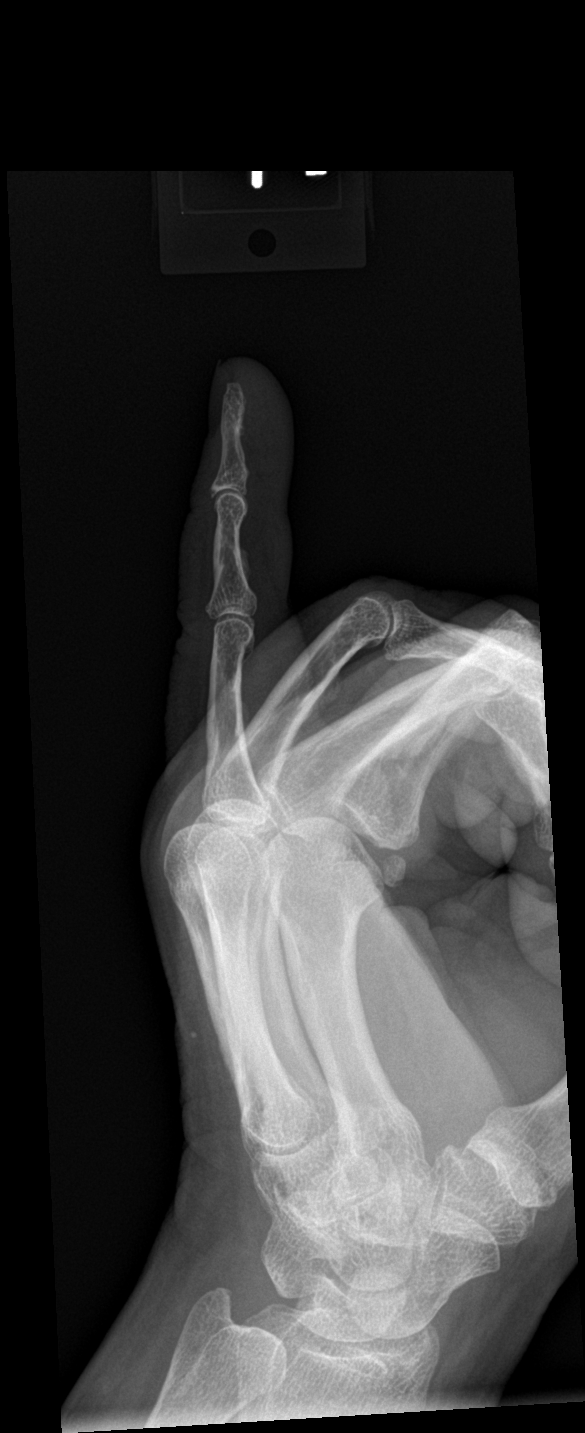

[3 of 3 positions shown; findings below may reference images not displayed]

FINDINGS: There is no evidence of fracture or dislocation. There is no
evidence of arthropathy or other focal bone abnormality. Mild
diffuse soft tissue swelling.
IMPRESSION: 1. No acute bone abnormality.
2. Soft tissue swelling.

## 2021-04-02 NOTE — ED Notes (Signed)
Will discharge pt once clarify with provider whether pt is to receive note stating should not use L hand for 2 days as previously stated.

## 2021-04-02 NOTE — ED Provider Notes (Signed)
[facility] Emergency Department Provider Note  ____________________________________________   Event Date/Time   First MD Initiated Contact with Patient 04/02/21 1418     (approximate)  I have reviewed the triage vital signs and the nursing notes.   HISTORY  Chief Complaint Finger Injury    HPI Carolyn Sampson is a 60 y.o. female presents emergency department complaining of left hand pain.  Patient states she was washing a heavy pot when she went to move it and it landed on the left hand.  States the areas become very discolored and painful.  A pop was cool to touch at the time it landed on her hand.  Patient states she does use both hands at work.  Past Medical History:  Diagnosis Date   Fatty liver    Pre-diabetes     Patient Active Problem List   Diagnosis Date Noted   Muscle cramps 05/28/2018   Pre-diabetes 05/28/2018   Fatty liver 05/28/2018   Hair loss 05/28/2018    Past Surgical History:  Procedure Laterality Date   APPENDECTOMY     CESAREAN SECTION     CESAREAN SECTION     LAPAROSCOPIC HYSTERECTOMY     LAPAROSCOPIC OOPHERECTOMY Right     Prior to Admission medications   Not on File    Allergies Patient has no known allergies.  No family history on file.  Social History Social History   Tobacco Use   Smoking status: Never   Smokeless tobacco: Never  Vaping Use   Vaping Use: Never used  Substance Use Topics   Drug use: Never    Review of Systems  Constitutional: No fever/chills Eyes: No visual changes. ENT: No sore throat. Respiratory: Denies cough Cardiovascular: Denies chest pain Gastrointestinal: Denies abdominal pain Genitourinary: Negative for dysuria. Musculoskeletal: Negative for back pain.  Positive for left hand pain Skin: Negative for rash. Psychiatric: no mood changes,     ____________________________________________   PHYSICAL EXAM:  VITAL SIGNS: ED Triage Vitals  Enc Vitals Group      BP 04/02/21 1401 118/68     Pulse Rate 04/02/21 1402 67     Resp 04/02/21 1401 18     Temp 04/02/21 1401 98.3 F (36.8 C)     Temp src --      SpO2 04/02/21 1401 97 %     Weight 04/02/21 1401 150 lb (68 kg)     Height 04/02/21 1401 4\' 10"  (1.473 m)     Head Circumference --      Peak Flow --      Pain Score 04/02/21 1412 5     Pain Loc --      Pain Edu? --      Excl. in Organ? --     Constitutional: Alert and oriented. Well appearing and in no acute distress. Eyes: Conjunctivae are normal.  Head: Atraumatic. Nose: No congestion/rhinnorhea. Mouth/Throat: Mucous membranes are moist.   Neck:  supple no lymphadenopathy noted Cardiovascular: Normal rate, regular rhythm. Heart sounds are normal Respiratory: Normal respiratory effort.  No retractions, lungs c t a  GU: deferred Musculoskeletal: FROM all extremities, warm and well perfused, left fifth finger is bruised and tender, neurovascular is intact, full range of motion intact Neurologic:  Normal speech and language.  Skin:  Skin is warm, dry and intact. No rash noted. Psychiatric: Mood and affect are normal. Speech and behavior are normal.  ____________________________________________   LABS (all labs ordered are listed, but only abnormal results are  displayed)  Labs Reviewed - No data to display ____________________________________________   ____________________________________________  RADIOLOGY  X-ray of the left fifth finger  ____________________________________________   PROCEDURES  Procedure(s) performed: Buddy tape fingers  Procedures    ____________________________________________   INITIAL IMPRESSION / ASSESSMENT AND PLAN / ED COURSE  Pertinent labs & imaging results that were available during my care of the patient were reviewed by me and considered in my medical decision making (see chart for details).   Patient 60 year old female presents emergency department with left hand injury.  See HPI.   Patient is a Personal assistant.  Physical exam shows patient be stable and looks to have any contusion to the left hand.  X-ray of the left fifth finger reviewed by me confirmed by radiology to be negative for fracture  Finger was buddy taped to the fourth finger.  Worker's Comp. instructions include no use of the left hand for 2 days.  She is to apply ice and take Motrin.  Follow-up with employee health and wellness.  She is discharged stable condition.     Carolyn Sampson was evaluated in Emergency Department on 04/02/2021 for the symptoms described in the history of present illness. She was evaluated in the context of the global COVID-19 pandemic, which necessitated consideration that the patient might be at risk for infection with the SARS-CoV-2 virus that causes COVID-19. Institutional protocols and algorithms that pertain to the evaluation of patients at risk for COVID-19 are in a state of rapid change based on information released by regulatory bodies including the CDC and federal and state organizations. These policies and algorithms were followed during the patient's care in the ED.    As part of my medical decision making, I reviewed the following data within the [facility] notes reviewed and incorporated, Old chart reviewed, Radiograph reviewed , Notes from prior ED visits, and [place] Controlled Substance Database  ____________________________________________   FINAL CLINICAL IMPRESSION(S) / ED DIAGNOSES  Final diagnoses:  Contusion of left little finger without damage to nail, initial encounter      NEW MEDICATIONS STARTED DURING THIS VISIT:  New Prescriptions   No medications on file     Note:  This document was prepared using Dragon voice recognition software and may include unintentional dictation errors.    Versie Starks, PA-C 04/02/21 1622    Blake Divine, MD 04/06/21 [phone]

## 2021-04-02 NOTE — ED Notes (Signed)
Pt to ED c/o L pinkie finger pain and swelling after work-related injury at [facility] in kitchen. A heavy pot fell on her finger 1.5d ago. States that discolored area has grown.

## 2021-04-02 NOTE — ED Triage Notes (Signed)
Pt presents via POV c/o left 5th digit of hand injury on Thursday evening. Reports dropped large pot on finger. Report redness is worsening to finger.

## 2021-04-02 NOTE — Discharge Instructions (Addendum)
Follow-up with employee health and wellness and to to 3 days

## 2021-05-03 ENCOUNTER — Other Ambulatory Visit: Payer: Self-pay

## 2021-05-03 MED ORDER — PREDNISONE 20 MG PO TABS
ORAL_TABLET | ORAL | 0 refills | Status: DC
  Filled 2021-05-03 (×2): qty 10, 5d supply, fill #0

## 2021-05-03 MED ORDER — METHOCARBAMOL 500 MG PO TABS
ORAL_TABLET | ORAL | 0 refills | Status: DC
  Filled 2021-05-03: qty 30, 30d supply, fill #0

## 2021-05-04 ENCOUNTER — Other Ambulatory Visit: Payer: Self-pay

## 2021-05-04 DIAGNOSIS — Z1159 Encounter for screening for other viral diseases: Secondary | ICD-10-CM | POA: Diagnosis not present

## 2021-05-04 DIAGNOSIS — R350 Frequency of micturition: Secondary | ICD-10-CM | POA: Diagnosis not present

## 2021-05-04 DIAGNOSIS — Z780 Asymptomatic menopausal state: Secondary | ICD-10-CM | POA: Diagnosis not present

## 2021-05-04 DIAGNOSIS — Z1231 Encounter for screening mammogram for malignant neoplasm of breast: Secondary | ICD-10-CM | POA: Diagnosis not present

## 2021-05-04 DIAGNOSIS — R7303 Prediabetes: Secondary | ICD-10-CM | POA: Diagnosis not present

## 2021-05-04 DIAGNOSIS — Z114 Encounter for screening for human immunodeficiency virus [HIV]: Secondary | ICD-10-CM | POA: Diagnosis not present

## 2021-05-04 DIAGNOSIS — Z7689 Persons encountering health services in other specified circumstances: Secondary | ICD-10-CM | POA: Diagnosis not present

## 2021-05-04 DIAGNOSIS — K760 Fatty (change of) liver, not elsewhere classified: Secondary | ICD-10-CM | POA: Diagnosis not present

## 2021-05-04 DIAGNOSIS — Z0000 Encounter for general adult medical examination without abnormal findings: Secondary | ICD-10-CM | POA: Diagnosis not present

## 2021-05-05 ENCOUNTER — Other Ambulatory Visit: Payer: Self-pay | Admitting: Internal Medicine

## 2021-05-05 DIAGNOSIS — Z1231 Encounter for screening mammogram for malignant neoplasm of breast: Secondary | ICD-10-CM

## 2021-05-24 ENCOUNTER — Other Ambulatory Visit: Payer: Self-pay

## 2021-05-24 ENCOUNTER — Ambulatory Visit (INDEPENDENT_AMBULATORY_CARE_PROVIDER_SITE_OTHER): Payer: Self-pay | Admitting: Gastroenterology

## 2021-05-24 DIAGNOSIS — Z1211 Encounter for screening for malignant neoplasm of colon: Secondary | ICD-10-CM

## 2021-05-24 MED ORDER — NA SULFATE-K SULFATE-MG SULF 17.5-3.13-1.6 GM/177ML PO SOLN: 354.0000 mL | Freq: Once | ORAL | 0 refills | Status: AC

## 2021-05-24 NOTE — Progress Notes (Signed)
Gastroenterology Pre-Procedure Review  Request Date: 06/02/21 Requesting Physician: Dr. Marius Ditch  PATIENT REVIEW QUESTIONS: The patient responded to the following health history questions as indicated:    1. Are you having any GI issues? no 2. Do you have a personal history of Polyps? no 3. Do you have a family history of Colon Cancer or Polyps? no 4. Diabetes Mellitus? no 5. Joint replacements in the past 12 months?no 6. Major health problems in the past 3 months?no 7. Any artificial heart valves, MVP, or defibrillator?no    MEDICATIONS & ALLERGIES:    Patient reports the following regarding taking any anticoagulation/antiplatelet therapy:   Plavix, Coumadin, Eliquis, Xarelto, Lovenox, Pradaxa, Brilinta, or Effient? no Aspirin? no  Patient confirms/reports the following medications:  Current Outpatient Medications  Medication Sig Dispense Refill   Na Sulfate-K Sulfate-Mg Sulf 17.5-3.13-1.6 GM/177ML SOLN Take 354 mLs by mouth once for 1 dose. Starting at 5 PM take one bottle and pour into the supplied cup, add cool water to the fill 16 oz line and drink all. Then 5 hours before procedure pour the second bottle into the supplied cup, add cool water to the fill 16 oz line and drink all. 354 mL 0   No current facility-administered medications for this visit.    Patient confirms/reports the following allergies:  Allergies  Allergen Reactions   Tramadol Nausea And Vomiting and Other (See Comments)    Dizziness. Dizziness. Dizziness. Dizziness.     No orders of the defined types were placed in this encounter.   AUTHORIZATION INFORMATION Primary Insurance: 1D#: Group #:  Secondary Insurance: 1D#: Group #:  SCHEDULE INFORMATION: Date: 06/02/21 Time: Location: [place]

## 2021-05-25 ENCOUNTER — Ambulatory Visit: Payer: PRIVATE HEALTH INSURANCE | Attending: Surgical | Admitting: Physical Therapy

## 2021-05-25 DIAGNOSIS — M25612 Stiffness of left shoulder, not elsewhere classified: Secondary | ICD-10-CM | POA: Insufficient documentation

## 2021-05-25 DIAGNOSIS — M25512 Pain in left shoulder: Secondary | ICD-10-CM | POA: Diagnosis not present

## 2021-05-25 NOTE — Therapy (Signed)
[place] MAIN [facility] SERVICES [address] [place], [place], [address] Phone: [phone]   Fax:  [phone]  Physical Therapy Evaluation  Patient Details  Name: Carolyn Sampson MRN: [id number] Date of Birth: 19-Jan-1961 Referring Provider (PT): Mickle Asper [facility]  Encounter Date: 05/25/2021   PT End of Session - 05/25/21 0924     Visit Number 1    Number of Visits 12    Date for PT Re-Evaluation 07/06/21    Authorization Type WC    Authorization - Visit Number 1    Authorization - Number of Visits 12    Progress Note Due on Visit 10    PT Start Time 0905    PT Stop Time 0958    PT Time Calculation (min) 53 min    Activity Tolerance Patient tolerated treatment well             Past Medical History:  Diagnosis Date   Fatty liver    Pre-diabetes     Past Surgical History:  Procedure Laterality Date   APPENDECTOMY     CESAREAN SECTION     CESAREAN SECTION     LAPAROSCOPIC HYSTERECTOMY     LAPAROSCOPIC OOPHERECTOMY Right     There were no vitals filed for this visit.    Subjective Assessment - 05/25/21 0909     Subjective Pt reports injury occurred at work when she was pulling a tray of plates. Pt reports she was pulling a cart of trays across her body horizontally abduction the involved shoulder per report). Pt reports this injury occured on April 26, 2021. Pt reports since the injury her pain has improved, particularly since injection which she received on Wednesday (last week). Pt also reports since the injection she has had headaches. Pt reports headaches since the injection seem to come and go. Pt reports continued pain with driving and performing movements involving bringing arm across chest. pt initially had 5 lb lifting restriction and following a week and now she is back to doing her normal job. Pt takes pt dishes in cart, stacks plates, puts them in dish washing machine, takes plates in and out of cart and puts them  away. Pt also states because she is having to overuse the right shoulder she is beginning to feel some pain in that shoulder as well.    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 Is now performing normal duty but is having to modify activities and is not able to perform lifting activities such as taking the trash out.    Limitations Other (comment)   moving arm across chest, driving,   Patient Stated Goals To be able to perform work duties on the same routine as she had before.                [facility] PT Assessment - 05/25/21 0001       Assessment   Medical Diagnosis left shoulder pain    Referring Provider (PT) Mickle Asper [facility]    Onset Date/Surgical Date 04/28/21      Precautions   Precaution Comments MD reported not to lift arm over head, no specified time frame      Observation/Other Assessments   Focus on Therapeutic Outcomes (FOTO)  47      ROM / Strength   AROM / PROM / Strength AROM;PROM;Strength      AROM  AROM Assessment Site Shoulder    Right/Left Shoulder Right;Left    Left Shoulder Flexion 102 Degrees    Left Shoulder ABduction 70 Degrees    Left Shoulder Internal Rotation --   To L1/2 with pain noted, difficulty moving shoulder out of this position.   Left Shoulder External Rotation 22 Degrees      PROM   Overall PROM Comments 80 ABD, 95 Flex, 38 ER at 45,    PROM Assessment Site Shoulder    Right/Left Shoulder Left    Left Shoulder Flexion 95 Degrees    Left Shoulder ABduction 80 Degrees    Left Shoulder External Rotation 38 Degrees      Strength   Overall Strength Comments R Shoulder WNL, pain with ABD and flexion    Strength Assessment Site Shoulder    Right/Left Shoulder Left    Left Shoulder ABduction 3-/5    Left Shoulder Internal Rotation 3-/5    Left Shoulder External Rotation 3-/5               Manual Therapy: 10 min total Gentle  grade 1/2 inferior glide/ shoulder distraction, min pain noted with this activity AP glides grade 2/3: no pain noted at start but some pain noted after a 2-3 minutes Gentle PROM into flexion, ER, abd: pt had some difficulty with relaxing shoulder to allow for full PROM despite cues.  Palpation: TTP of pec minor, upper traps, levator scapulae, and suprispinatus muscle > infraspinatus.  -Pt also localized pain to anterolateral aspect of left shoulder that radiated inferiorly and anteriorly.          Objective measurements completed on examination: See above findings.               PT Education - 05/25/21 1033     Education Details Pt educated regarding use of Ice and protection of contralater shoulder as she is able    Person(s) Educated Patient    Methods Explanation    Comprehension Verbalized understanding              PT Short Term Goals - 05/25/21 1017       PT SHORT TERM GOAL #1   Title Patient will be independent in home exercise program to improve strength/mobility for better functional independence with ADLs.    Baseline Pt does not have HEP    Time 3    Period Weeks    Status New    Target Date 06/15/21      PT SHORT TERM GOAL #2   Title Patient will increase FOTO score to equal to or greater than   53  to demonstrate statistically significant improvement in mobility and quality of life.    Baseline 47 FOTO    Time 4    Period Weeks    Target Date 06/22/21      PT SHORT TERM GOAL #3   Title Pt will improve shoulder flexion and abduction AROM by 15 degrees each indicationg progress with funcitonal shoulder elevation    Baseline 80ABD, 105 Flexion    Time 4    Period Weeks    Status New    Target Date 06/22/21               PT Long Term Goals - 05/25/21 1020       PT LONG TERM GOAL #1   Title Patient will increase FOTO score to equal to or greater than  62   to demonstrate statistically significant improvement in  mobility and quality  of life.    Baseline 47    Time 8    Period Weeks    Status New    Target Date 07/20/21      PT LONG TERM GOAL #2   Title Pt will improve L shoulder strength to 4-/5 or greater with abd, flex and ER within her available ROM in order to indicate improved shoulder funciton    Baseline see flowsheet    Time 8    Period Weeks    Status New    Target Date 07/20/21      PT LONG TERM GOAL #3   Title Pt will report ability to complete all work related activities without c/o pain in either of her shoulders in order to indicate improved funciton and achiavement of pt stated goal.    Baseline Pt has to modify her work and has pain with many activities at work    Time 8    Period Weeks    Status New    Target Date 07/20/21                    Plan - 05/25/21 0925     Clinical Impression Statement Pt is pleasant 60 y.o. female who presents to therapy with c/o shoulder pain beginning in late July 2022 following pulling a tray across her body when performing her normal duties at work. Pt is primarally spanish speaking and was accompanied by interpreter at initial evaluation. Pt presentes with limitations in left shoulder active range of motion, passive range of motion and strength with the greatest deficits in shoulder external rotation and elevation (both flexion abduction). Pt has limitations in her ability to lift and carry objects and has to modify her normal work duties to account for her pain and discomfort. Pt also has limitations in her ability to drive utilizing her left shoulder. Pt will benefit from skilled physical therapy intervention in order to address her deficits, improve her pain with everyday activities and in order to return to normal daily activities.    Personal Factors and Comorbidities Age;Comorbidity 1    Comorbidities pre-diabetes    Examination-Activity Limitations Carry;Lift;Reach Overhead    Examination-Participation Restrictions Occupation;Driving     Stability/Clinical Decision Making Stable/Uncomplicated    Clinical Decision Making Low    Rehab Potential Good    PT Frequency 2x / week    PT Duration 6 weeks    PT Treatment/Interventions ADLs/Self Care Home Management;Aquatic Therapy;Electrical Stimulation;Moist Heat;Ultrasound;DME Instruction;Functional mobility training;Therapeutic activities;Therapeutic exercise;Neuromuscular re-education;Patient/family education;Manual techniques;Passive range of motion;Energy conservation;Taping;Vasopneumatic Device;Joint Manipulations    PT Next Visit Plan Begin initial HEP    PT Home Exercise Plan Will estrablish at initial             Patient will benefit from skilled therapeutic intervention in order to improve the following deficits and impairments:  Decreased range of motion, Improper body mechanics, Decreased activity tolerance, Decreased strength, Impaired UE functional use  Visit Diagnosis: Acute pain of left shoulder  Stiffness of left shoulder, not elsewhere classified     Problem List Patient Active Problem List   Diagnosis Date Noted   Muscle cramps 05/28/2018   Pre-diabetes 05/28/2018   Fatty liver 05/28/2018   Hair loss 05/28/2018   Sciatica 11/01/2015   S/P right oophorectomy 11/01/2015   S/P hysterectomy 11/01/2015   Rivka Barbara PT, DPT   Particia Lather 05/25/2021, 10:35 AM  [place] MAIN [facility] SERVICES [place]  [place], [place], [address] Phone: [phone]   Fax:  [phone]  Name: Carolyn Sampson MRN: [id number] Date of Birth: 1961/08/08

## 2021-05-30 ENCOUNTER — Ambulatory Visit: Payer: PRIVATE HEALTH INSURANCE | Admitting: Physical Therapy

## 2021-05-30 ENCOUNTER — Other Ambulatory Visit: Payer: Self-pay

## 2021-05-30 DIAGNOSIS — M25512 Pain in left shoulder: Secondary | ICD-10-CM

## 2021-05-30 DIAGNOSIS — M25612 Stiffness of left shoulder, not elsewhere classified: Secondary | ICD-10-CM

## 2021-05-30 NOTE — Therapy (Signed)
[place] MAIN [facility] SERVICES [address] [place], [place], [address] Phone: [phone]   Fax:  [phone]  Physical Therapy Treatment  Patient Details  Name: Carolyn Sampson MRN: [id number] Date of Birth: Jun 29, 1961 Referring Provider (PT): Mickle Asper [facility]   Encounter Date: 05/30/2021   PT End of Session - 05/30/21 1009     Visit Number 2    Number of Visits 12    Date for PT Re-Evaluation 07/06/21    Authorization Type WC    Authorization - Visit Number 2    Authorization - Number of Visits 12    Progress Note Due on Visit 10    PT Start Time 0833    PT Stop Time 0916    PT Time Calculation (min) 43 min    Equipment Utilized During Treatment Gait belt    Activity Tolerance Patient tolerated treatment well             Past Medical History:  Diagnosis Date   Fatty liver    Pre-diabetes     Past Surgical History:  Procedure Laterality Date   APPENDECTOMY     CESAREAN SECTION     CESAREAN SECTION     LAPAROSCOPIC HYSTERECTOMY     LAPAROSCOPIC OOPHERECTOMY Right     There were no vitals filed for this visit.   Subjective Assessment - 05/30/21 0837     Subjective Pt reports the right arm hurts more than the left at this point. Pt reports from increased use the right arm is now feeling sore but does not report the pain as sharp.    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 Is now performing normal duty but is having to modify activities and is not able to perform lifting activities such as taking the trash out.    Currently in Pain? Yes    Pain Score 3     Pain Location Shoulder    Pain Orientation Right;Left    Pain Descriptors / Indicators Aching;Sore                                       PT Education - 05/30/21 1008     Education Details Pt educated regarding HEP and proper timplementation at home    Person(s) Educated  Patient    Methods Explanation;Handout;Demonstration;Tactile cues;Verbal cues    Comprehension Verbalized understanding;Returned demonstration;Tactile cues required;Need further instruction              PT Short Term Goals - 05/25/21 1017       PT SHORT TERM GOAL #1   Title Patient will be independent in home exercise program to improve strength/mobility for better functional independence with ADLs.    Baseline Pt does not have HEP    Time 3    Period Weeks    Status New    Target Date 06/15/21      PT SHORT TERM GOAL #2   Title Patient will increase FOTO score to equal to or greater than   53  to demonstrate statistically significant improvement in mobility and quality of life.    Baseline 47 FOTO    Time 4    Period Weeks    Target Date 06/22/21      PT SHORT TERM GOAL #3   Title Pt will improve shoulder flexion and  abduction AROM by 15 degrees each indicationg progress with funcitonal shoulder elevation    Baseline 80ABD, 105 Flexion    Time 4    Period Weeks    Status New    Target Date 06/22/21               PT Long Term Goals - 05/25/21 1020       PT LONG TERM GOAL #1   Title Patient will increase FOTO score to equal to or greater than  62   to demonstrate statistically significant improvement in mobility and quality of life.    Baseline 47    Time 8    Period Weeks    Status New    Target Date 07/20/21      PT LONG TERM GOAL #2   Title Pt will improve L shoulder strength to 4-/5 or greater with abd, flex and ER within her available ROM in order to indicate improved shoulder funciton    Baseline see flowsheet    Time 8    Period Weeks    Status New    Target Date 07/20/21      PT LONG TERM GOAL #3   Title Pt will report ability to complete all work related activities without c/o pain in either of her shoulders in order to indicate improved funciton and achiavement of pt stated goal.    Baseline Pt has to modify her work and has pain with many  activities at work    Time 8    Period Weeks    Status New    Target Date 07/20/21                    Patient will benefit from skilled therapeutic intervention in order to improve the following deficits and impairments:     Visit Diagnosis: Acute pain of left shoulder  Stiffness of left shoulder, not elsewhere classified     Problem List Patient Active Problem List   Diagnosis Date Noted   Muscle cramps 05/28/2018   Pre-diabetes 05/28/2018   Fatty liver 05/28/2018   Hair loss 05/28/2018   Sciatica 11/01/2015   S/P right oophorectomy 11/01/2015   S/P hysterectomy 11/01/2015    Particia Lather 05/30/2021, 10:12 AM  [facility] [address] [place], [place], [address] Phone: [phone]   Fax:  [phone]  Name: Carolyn Sampson MRN: [id number] Date of Birth: [birth year]

## 2021-05-30 NOTE — Therapy (Signed)
[place] MAIN [facility] SERVICES [address] [place], [place], [address] Phone: [phone]   Fax:  [phone]  Physical Therapy Treatment  Patient Details  Name: Carolyn Sampson MRN: [id number] Date of Birth: 1960/12/06 Referring Provider (PT): Mickle Asper [facility]   Encounter Date: 05/30/2021   PT End of Session - 05/30/21 1009     Visit Number 2    Number of Visits 12    Date for PT Re-Evaluation 07/06/21    Authorization Type WC    Authorization - Visit Number 2    Authorization - Number of Visits 12    Progress Note Due on Visit 10    PT Start Time 0833    PT Stop Time 0916    PT Time Calculation (min) 43 min    Equipment Utilized During Treatment Gait belt    Activity Tolerance Patient tolerated treatment well             Past Medical History:  Diagnosis Date   Fatty liver    Pre-diabetes     Past Surgical History:  Procedure Laterality Date   APPENDECTOMY     CESAREAN SECTION     CESAREAN SECTION     LAPAROSCOPIC HYSTERECTOMY     LAPAROSCOPIC OOPHERECTOMY Right     There were no vitals filed for this visit.   Subjective Assessment - 05/30/21 0837     Subjective Pt reports the right arm hurts more than the left at this point. Pt reports from increased use the right arm is now feeling sore but does not report the pain as sharp.    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 Is now performing normal duty but is having to modify activities and is not able to perform lifting activities such as taking the trash out.    Currently in Pain? Yes    Pain Score 3     Pain Location Shoulder    Pain Orientation Right;Left    Pain Descriptors / Indicators Aching;Sore            Pt accompanied by interpreter.    Shoulder mobs in 50 degrees ABD, PA and AP mobs felt the p best, inferior mobs caused occassional Sharp pain, improved ability to relax shoulder throughout  session with cues. grade 1 /2 gentle mobs.  Gentle EX PROM in supine with towel roll under arm, pt required 2 towell rolls combined for comfort with this position.  Gentle shoulder abduction through pain free range. Able to achieve approximately 90 degrees abduction prior to onset of pain / discomfort  -Pt requires frequent cues to allow for proper relaxation of the shoulder.   Shoulder isometrics:  -IR 15 x 5 sec holds  -Ext 10 x 5 sec holds  - ER: Painful, did not continue with this exercise secondary to pain - Pt required multiple cues for proper arm placement including VC, TC and demonstration. Pt reports shoulder felt tired following session but pain was not exacerbated  HEP included the following exercises as listed above.  Ice/cold therapy therapy x 5 min on bilateral shoulder post session for pain relief and inflamatory relief post session and prior to work day.(No charge)                        PT Education - 05/30/21 1008     Education Details Pt educated regarding HEP and proper timplementation at  home    Person(s) Educated Patient    Methods Explanation;Handout;Demonstration;Tactile cues;Verbal cues    Comprehension Verbalized understanding;Returned demonstration;Tactile cues required;Need further instruction              PT Short Term Goals - 05/25/21 1017       PT SHORT TERM GOAL #1   Title Patient will be independent in home exercise program to improve strength/mobility for better functional independence with ADLs.    Baseline Pt does not have HEP    Time 3    Period Weeks    Status New    Target Date 06/15/21      PT SHORT TERM GOAL #2   Title Patient will increase FOTO score to equal to or greater than   53  to demonstrate statistically significant improvement in mobility and quality of life.    Baseline 47 FOTO    Time 4    Period Weeks    Target Date 06/22/21      PT SHORT TERM GOAL #3   Title Pt will improve shoulder flexion and  abduction AROM by 15 degrees each indicationg progress with funcitonal shoulder elevation    Baseline 80ABD, 105 Flexion    Time 4    Period Weeks    Status New    Target Date 06/22/21               PT Long Term Goals - 05/25/21 1020       PT LONG TERM GOAL #1   Title Patient will increase FOTO score to equal to or greater than  62   to demonstrate statistically significant improvement in mobility and quality of life.    Baseline 47    Time 8    Period Weeks    Status New    Target Date 07/20/21      PT LONG TERM GOAL #2   Title Pt will improve L shoulder strength to 4-/5 or greater with abd, flex and ER within her available ROM in order to indicate improved shoulder funciton    Baseline see flowsheet    Time 8    Period Weeks    Status New    Target Date 07/20/21      PT LONG TERM GOAL #3   Title Pt will report ability to complete all work related activities without c/o pain in either of her shoulders in order to indicate improved funciton and achiavement of pt stated goal.    Baseline Pt has to modify her work and has pain with many activities at work    Time [place]    Target Date 07/20/21                   Plan - 05/30/21 1014     Clinical Impression Statement Continued with current plan of care as laid out in evaluation. Pt remains motivated to advance progress toward goals. Rest breaks provided as needed, pt quick to ask when needed. Author maintains all interventions within appropriate level of intensity as not to purposefully exacerbate pain. Pt does require varying levels of assistance and cuing for completion of exercises for correct form and sometimes due to pain/weakness. Pt continues to demonstrate progress toward goals AEB progression of some interventions this date either in volume or intensity. Hep added this date including shoulder isometrics as described in this note.    Personal Factors and Comorbidities Age;Comorbidity  1  Comorbidities pre-diabetes    Examination-Activity Limitations Carry;Lift;Reach Overhead    Examination-Participation Restrictions Occupation;Driving    Stability/Clinical Decision Making Stable/Uncomplicated    Clinical Decision Making Low    Rehab Potential Good    PT Frequency 2x / week    PT Duration 6 weeks    PT Treatment/Interventions ADLs/Self Care Home Management;Aquatic Therapy;Electrical Stimulation;Moist Heat;Ultrasound;DME Instruction;Functional mobility training;Therapeutic activities;Therapeutic exercise;Neuromuscular re-education;Patient/family education;Manual techniques;Passive range of motion;Energy conservation;Taping;Vasopneumatic Device;Joint Manipulations    PT Next Visit Plan Go over HEP    PT Home Exercise Plan Provided 05/30/21    Consulted and Agree with Plan of Care Patient             Patient will benefit from skilled therapeutic intervention in order to improve the following deficits and impairments:  Decreased range of motion, Improper body mechanics, Decreased activity tolerance, Decreased strength, Impaired UE functional use  Visit Diagnosis: Acute pain of left shoulder  Stiffness of left shoulder, not elsewhere classified     Problem List Patient Active Problem List   Diagnosis Date Noted   Muscle cramps 05/28/2018   Pre-diabetes 05/28/2018   Fatty liver 05/28/2018   Hair loss 05/28/2018   Sciatica 11/01/2015   S/P right oophorectomy 11/01/2015   S/P hysterectomy 11/01/2015   Rivka Barbara PT, DPT   Particia Lather 05/30/2021, 10:15 AM  [place] [address] [place], [place], [address] Phone: [phone]   Fax:  [phone]  Name: Carolyn Sampson MRN: [id number] Date of Birth: [birth year]

## 2021-06-01 ENCOUNTER — Ambulatory Visit: Payer: PRIVATE HEALTH INSURANCE | Admitting: Physical Therapy

## 2021-06-01 ENCOUNTER — Encounter: Payer: Self-pay | Admitting: Gastroenterology

## 2021-06-02 ENCOUNTER — Ambulatory Visit: Payer: 59 | Admitting: Anesthesiology

## 2021-06-02 ENCOUNTER — Encounter: Payer: Self-pay | Admitting: Gastroenterology

## 2021-06-02 ENCOUNTER — Encounter
Admission: RE | Disposition: A | Discharge status: Home / Self Care | Payer: Self-pay | Source: Home / Self Care | Attending: Gastroenterology

## 2021-06-02 ENCOUNTER — Ambulatory Visit
Admission: RE | Admit: 2021-06-02 | Discharge: 2021-06-02 | Disposition: A | Discharge status: Home / Self Care | Payer: 59 | Attending: Gastroenterology | Admitting: Gastroenterology

## 2021-06-02 DIAGNOSIS — D1779 Benign lipomatous neoplasm of other sites: Secondary | ICD-10-CM | POA: Insufficient documentation

## 2021-06-02 DIAGNOSIS — Z885 Allergy status to narcotic agent status: Secondary | ICD-10-CM | POA: Diagnosis not present

## 2021-06-02 DIAGNOSIS — K635 Polyp of colon: Secondary | ICD-10-CM

## 2021-06-02 DIAGNOSIS — D122 Benign neoplasm of ascending colon: Secondary | ICD-10-CM | POA: Insufficient documentation

## 2021-06-02 DIAGNOSIS — Z1211 Encounter for screening for malignant neoplasm of colon: Secondary | ICD-10-CM | POA: Diagnosis not present

## 2021-06-02 HISTORY — PX: COLONOSCOPY WITH PROPOFOL: SHX5780

## 2021-06-02 SURGERY — COLONOSCOPY WITH PROPOFOL
Anesthesia: General

## 2021-06-02 MED ORDER — FENTANYL CITRATE (PF) 100 MCG/2ML IJ SOLN
INTRAMUSCULAR | Status: AC
  Filled 2021-06-02: qty 2

## 2021-06-02 MED ORDER — MIDAZOLAM HCL 2 MG/2ML IJ SOLN
INTRAMUSCULAR | Status: AC
  Filled 2021-06-02: qty 2

## 2021-06-02 MED ORDER — MIDAZOLAM HCL 2 MG/2ML IJ SOLN
INTRAMUSCULAR | Status: DC | PRN
  Administered 2021-06-02: 2 mg via INTRAVENOUS

## 2021-06-02 MED ORDER — PHENYLEPHRINE HCL (PRESSORS) 10 MG/ML IV SOLN
INTRAVENOUS | Status: DC | PRN
  Administered 2021-06-02 (×2): 50 ug via INTRAVENOUS

## 2021-06-02 MED ORDER — FENTANYL CITRATE (PF) 100 MCG/2ML IJ SOLN
INTRAMUSCULAR | Status: DC | PRN
  Administered 2021-06-02 (×4): 25 ug via INTRAVENOUS

## 2021-06-02 MED ORDER — SODIUM CHLORIDE 0.9 % IV SOLN: INTRAVENOUS | Status: DC

## 2021-06-02 MED ORDER — PROPOFOL 10 MG/ML IV BOLUS
INTRAVENOUS | Status: DC | PRN
Start: 2021-06-02 — End: 2021-06-02
  Administered 2021-06-02: 50 mg via INTRAVENOUS
  Administered 2021-06-02: 20 mg via INTRAVENOUS

## 2021-06-02 MED ORDER — ONDANSETRON HCL 4 MG/2ML IJ SOLN
INTRAMUSCULAR | Status: DC | PRN
  Administered 2021-06-02: 4 mg via INTRAVENOUS

## 2021-06-02 MED ORDER — PROPOFOL 500 MG/50ML IV EMUL
INTRAVENOUS | Status: DC | PRN
  Administered 2021-06-02: 65 ug/kg/min via INTRAVENOUS

## 2021-06-02 NOTE — H&P (Signed)
  Cephas Darby, MD [address]  [place]  [place], [place] [address]  Main: [phone]  Fax: [phone] Pager: [phone]  Primary Care Physician:  Pcp, No Primary Gastroenterologist:  Dr. Cephas Darby  Pre-Procedure History & Physical: HPI:  Carolyn Sampson is a 60 y.o. female is here for an colonoscopy.   Past Medical History:  Diagnosis Date   Fatty liver    Pre-diabetes     Past Surgical History:  Procedure Laterality Date   ABDOMINAL HYSTERECTOMY     APPENDECTOMY     CESAREAN SECTION     CESAREAN SECTION     LAPAROSCOPIC HYSTERECTOMY     LAPAROSCOPIC OOPHERECTOMY Right     Prior to Admission medications   Not on File    Allergies as of 05/24/2021 - Review Complete 04/02/2021  Allergen Reaction Noted   Tramadol Nausea And Vomiting and Other (See Comments) 08/07/2013    History reviewed. No pertinent family history.  Social History   Socioeconomic History   Marital status: Legally Separated    Spouse name: Not on file   Number of children: Not on file   Years of education: Not on file   Highest education level: Not on file  Occupational History   Occupation: unemployed    Comment: in trial period at [employer] Use   Smoking status: Never   Smokeless tobacco: Never  Vaping Use   Vaping Use: Never used  Substance and Sexual Activity   Alcohol use: Never   Drug use: Never   Sexual activity: Not on file  Other Topics Concern   Not on file  Social History Narrative   Not on file   Social Determinants of Health   Financial Resource Strain: Not on file  Food Insecurity: Not on file  Transportation Needs: Not on file  Physical Activity: Not on file  Stress: Not on file  Social Connections: Not on file  Intimate Partner Violence: Not on file    Review of Systems: See HPI, otherwise negative ROS  Physical Exam: BP (!) 142/78   Pulse 65   Temp (!) 96.7 F (35.9 C) (Temporal)   Resp 16   Ht 5' (1.524 m)   Wt 72.7 kg    LMP 10/16/2003 (Within Years)   SpO2 100%   BMI 31.32 kg/m  General:   Alert,  pleasant and cooperative in NAD Head:  Normocephalic and atraumatic. Neck:  Supple; no masses or thyromegaly. Lungs:  Clear throughout to auscultation.    Heart:  Regular rate and rhythm. Abdomen:  Soft, nontender and nondistended. Normal bowel sounds, without guarding, and without rebound.   Neurologic:  Alert and  oriented x4;  grossly normal neurologically.  Impression/Plan: Carolyn Sampson is here for an colonoscopy to be performed for colon cancer screening  Risks, benefits, limitations, and alternatives regarding  colonoscopy have been reviewed with the patient.  Questions have been answered.  All parties agreeable.   Sherri Sear, MD  06/02/2021, 9:06 AM

## 2021-06-02 NOTE — Anesthesia Postprocedure Evaluation (Signed)
Anesthesia Post Note  Patient: Carolyn Sampson  Procedure(s) Performed: COLONOSCOPY WITH PROPOFOL  Patient location during evaluation: PACU Anesthesia Type: General Level of consciousness: awake and alert Pain management: pain level controlled Vital Signs Assessment: post-procedure vital signs reviewed and stable Respiratory status: spontaneous breathing, nonlabored ventilation and respiratory function stable Cardiovascular status: blood pressure returned to baseline and stable Postop Assessment: no apparent nausea or vomiting Anesthetic complications: no   No notable events documented.   Last Vitals:  Vitals:   06/02/21 1010 06/02/21 1020  BP: 126/69   Pulse: (!) 54 (!) 55  Resp: 14 12  Temp:    SpO2: 99% 100%    Last Pain:  Vitals:   06/02/21 0940  TempSrc: Temporal  PainSc:                  [place] Ouch

## 2021-06-02 NOTE — Op Note (Signed)
[facility] Gastroenterology Patient Name: Carolyn Sampson Procedure Date: 06/02/2021 9:06 AM MRN: [id number] Account #: [id number] Date of Birth: 02/17/1961 Admit Type: Outpatient Age: 60 Room: [facility] ENDO ROOM 4 Gender: Female Note Status: Finalized Procedure:             Colonoscopy Indications:           Screening for colorectal malignant neoplasm, This is                         the patient's first colonoscopy Providers:             Lin Landsman MD, MD Referring MD:          No Local Md, MD (Referring MD) Medicines:             General Anesthesia Complications:         No immediate complications. Estimated blood loss: None. Procedure:             Pre-Anesthesia Assessment:                        - Prior to the procedure, a History and Physical was                         performed, and patient medications and allergies were                         reviewed. The patient is competent. The risks and                         benefits of the procedure and the sedation options and                         risks were discussed with the patient. All questions                         were answered and informed consent was obtained.                         Patient identification and proposed procedure were                         verified by the physician, the nurse, the                         anesthesiologist, the anesthetist and the technician                         in the pre-procedure area in the procedure room in the                         endoscopy suite. Mental Status Examination: alert and                         oriented. Airway Examination: normal oropharyngeal                         airway and neck mobility. Respiratory Examination:  clear to auscultation. CV Examination: normal.                         Prophylactic Antibiotics: The patient does not require                         prophylactic antibiotics. Prior Anticoagulants: The                          patient has taken no previous anticoagulant or                         antiplatelet agents. ASA Grade Assessment: II - A                         patient with mild systemic disease. After reviewing                         the risks and benefits, the patient was deemed in                         satisfactory condition to undergo the procedure. The                         anesthesia plan was to use general anesthesia.                         Immediately prior to administration of medications,                         the patient was re-assessed for adequacy to receive                         sedatives. The heart rate, respiratory rate, oxygen                         saturations, blood pressure, adequacy of pulmonary                         ventilation, and response to care were monitored                         throughout the procedure. The physical status of the                         patient was re-assessed after the procedure.                        After obtaining informed consent, the colonoscope was                         passed under direct vision. Throughout the procedure,                         the patient's blood pressure, pulse, and oxygen                         saturations were monitored continuously. The  Colonoscope was introduced through the anus and                         advanced to the the terminal ileum. The colonoscopy                         was performed without difficulty. The patient                         tolerated the procedure well. The quality of the bowel                         preparation was evaluated using the BBPS [facility] Bowel                         Preparation Scale) with scores of: Right Colon = 3,                         Transverse Colon = 3 and Left Colon = 3 (entire mucosa                         seen well with no residual staining, small fragments                         of stool or opaque liquid).  The total BBPS score                         equals 9. Findings:      The perianal and digital rectal examinations were normal. Pertinent       negatives include normal sphincter tone and no palpable rectal lesions.      The terminal ileum appeared normal.      A localized area of granular mucosa was found at the ileocecal valve.       Biopsies were taken with a cold forceps for histology.      A 5 mm polyp was found in the ascending colon. The polyp was sessile.       The polyp was removed with a cold snare. Resection and retrieval were       complete.      The retroflexed view of the distal rectum and anal verge was normal and       showed no anal or rectal abnormalities.      The exam was otherwise without abnormality. Impression:            - The examined portion of the ileum was normal.                        - Granularity at the ileocecal valve. Biopsied.                        - One 5 mm polyp in the ascending colon, removed with                         a cold snare. Resected and retrieved.                        - The distal rectum and anal verge are normal on  retroflexion view.                        - The examination was otherwise normal. Recommendation:        - Discharge patient to home (with escort).                        - Resume previous diet today.                        - Continue present medications.                        - Await pathology results.                        - Repeat colonoscopy in 5 years for surveillance based                         on pathology results. Procedure Code(s):     --- Professional ---                        [phone], Colonoscopy, flexible; with removal of                         tumor(s), polyp(s), or other lesion(s) by snare                         technique                        45380, 4, Colonoscopy, flexible; with biopsy, single                         or multiple Diagnosis Code(s):     --- Professional ---                         Z12.11, Encounter for screening for malignant neoplasm                         of colon                        K63.5, Polyp of colon                        K63.89, Other specified diseases of intestine CPT copyright 2019 American Medical Association. All rights reserved. The codes documented in this report are preliminary and upon coder review may  be revised to meet current compliance requirements. Dr. Ulyess Mort Lin Landsman MD, MD 06/02/2021 9:42:18 AM This report has been signed electronically. Number of Addenda: 0 Note Initiated On: 06/02/2021 9:06 AM Scope Withdrawal Time: 0 hours 13 minutes 51 seconds  Total Procedure Duration: 0 hours 18 minutes 0 seconds  Estimated Blood Loss:  Estimated blood loss: none.      [facility]

## 2021-06-02 NOTE — Transfer of Care (Signed)
Immediate Anesthesia Transfer of Care Note  Patient: Carolyn Sampson  Procedure(s) Performed: COLONOSCOPY WITH PROPOFOL  Patient Location: PACU  Anesthesia Type:General  Level of Consciousness: awake, alert  and oriented  Airway & Oxygen Therapy: Patient Spontanous Breathing and Patient connected to nasal cannula oxygen  Post-op Assessment: Report given to RN, Post -op Vital signs reviewed and stable and Patient moving all extremities  Post vital signs: Reviewed and stable  Last Vitals:  Vitals Value Taken Time  BP 107/60 06/02/21 0946  Temp    Pulse 65 06/02/21 0947  Resp 15 06/02/21 0947  SpO2 99 % 06/02/21 0947  Vitals shown include unvalidated device data.  Last Pain:  Vitals:   06/02/21 0838  TempSrc: Temporal  PainSc: 0-No pain         Complications: No notable events documented.

## 2021-06-02 NOTE — Anesthesia Preprocedure Evaluation (Signed)
Anesthesia Evaluation  Patient identified by MRN, date of birth, ID band Patient awake    Reviewed: Allergy & Precautions, NPO status , Patient's Chart, lab work & pertinent test results  Airway Mallampati: II  TM Distance: >3 FB Neck ROM: full    Dental  (+) Partial Upper   Pulmonary neg pulmonary ROS,    Pulmonary exam normal        Cardiovascular negative cardio ROS Normal cardiovascular exam     Neuro/Psych negative neurological ROS  negative psych ROS   GI/Hepatic negative GI ROS, Fatty Liver Disease   Endo/Other  negative endocrine ROSPre-diabetes  Renal/GU negative Renal ROS  negative genitourinary   Musculoskeletal   Abdominal Normal abdominal exam  (+)   Peds  Hematology negative hematology ROS (+)   Anesthesia Other Findings Past Medical History: No date: Fatty liver No date: Pre-diabetes  Past Surgical History: No date: ABDOMINAL HYSTERECTOMY No date: APPENDECTOMY No date: CESAREAN SECTION No date: CESAREAN SECTION No date: LAPAROSCOPIC HYSTERECTOMY No date: LAPAROSCOPIC OOPHERECTOMY; Right     Reproductive/Obstetrics negative OB ROS                            Anesthesia Physical Anesthesia Plan  ASA: 2  Anesthesia Plan: General   Post-op Pain Management:    Induction:   PONV Risk Score and Plan: 3 and Propofol infusion, TIVA and Treatment may vary due to age or medical condition  Airway Management Planned: Natural Airway and Nasal Cannula  Additional Equipment:   Intra-op Plan:   Post-operative Plan:   Informed Consent: I have reviewed the patients History and Physical, chart, labs and discussed the procedure including the risks, benefits and alternatives for the proposed anesthesia with the patient or authorized representative who has indicated his/her understanding and acceptance.     Dental Advisory Given  Plan Discussed with: Anesthesiologist,  CRNA and Surgeon  Anesthesia Plan Comments:         Anesthesia Quick Evaluation

## 2021-06-03 ENCOUNTER — Encounter: Payer: Self-pay | Admitting: Gastroenterology

## 2021-06-03 LAB — SURGICAL PATHOLOGY

## 2021-06-04 ENCOUNTER — Encounter: Payer: Self-pay | Admitting: Gastroenterology

## 2021-06-07 ENCOUNTER — Ambulatory Visit: Payer: PRIVATE HEALTH INSURANCE | Attending: Surgical | Admitting: Physical Therapy

## 2021-06-07 ENCOUNTER — Other Ambulatory Visit: Payer: Self-pay

## 2021-06-07 DIAGNOSIS — M6281 Muscle weakness (generalized): Secondary | ICD-10-CM | POA: Diagnosis present

## 2021-06-07 DIAGNOSIS — M25612 Stiffness of left shoulder, not elsewhere classified: Secondary | ICD-10-CM | POA: Insufficient documentation

## 2021-06-07 DIAGNOSIS — M25512 Pain in left shoulder: Secondary | ICD-10-CM | POA: Insufficient documentation

## 2021-06-07 NOTE — Therapy (Signed)
[place] MAIN [facility] SERVICES [address] [place], [place], [address] Phone: [phone]   Fax:  [phone]  Physical Therapy Treatment  Patient Details  Name: Carolyn Sampson MRN: [id number] Date of Birth: Apr 12, 1961 Referring Provider (PT): Mickle Asper [facility]   Encounter Date: 06/07/2021   PT End of Session - 06/07/21 0837     Visit Number 3    Number of Visits 12    Date for PT Re-Evaluation 07/06/21    Authorization Type WC    Authorization - Visit Number 3    Authorization - Number of Visits 12    PT Start Time 0802    PT Stop Time [id number]    PT Time Calculation (min) 42 min    Activity Tolerance Patient tolerated treatment well    Behavior During Therapy [facility] for tasks assessed/performed             Past Medical History:  Diagnosis Date   Fatty liver    Pre-diabetes     Past Surgical History:  Procedure Laterality Date   ABDOMINAL HYSTERECTOMY     APPENDECTOMY     CESAREAN SECTION     CESAREAN SECTION     COLONOSCOPY WITH PROPOFOL N/A 06/02/2021   Procedure: COLONOSCOPY WITH PROPOFOL;  Surgeon: Lin Landsman, MD;  Location: [facility] ENDOSCOPY;  Service: Gastroenterology;  Laterality: N/A;  SPANISH INTERPRETER   LAPAROSCOPIC HYSTERECTOMY     LAPAROSCOPIC OOPHERECTOMY Right     There were no vitals filed for this visit.   Subjective Assessment - 06/07/21 0805     Subjective Pt reports both of her shoulders are feling a little better. Still having occassional pain in the contralateral shoulder (right) but both are better compared to her last session. Pt reports working yesterday. (Interpreter not present prior to this point in subjective but questions were ased in simple manner and pt understood questions based on appropriate responses)    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 Is now performing normal duty but is having to modify activities and is not able to  perform lifting activities such as taking the trash out.    Limitations Other (comment)    Patient Stated Goals To be able to perform work duties on the same routine as she had before.    Currently in Pain? Yes    Pain Score 5     Pain Location Shoulder    Pain Orientation Left    Pain Descriptors / Indicators Aching;Sore    Pain Type Acute pain    Pain Onset More than a month ago                               [facility] Adult PT Treatment/Exercise - 06/07/21 0001       Exercises   Exercises Shoulder      Shoulder Exercises: Seated   Row 10 reps;Right;Left;Strengthening    Theraband Level (Shoulder Row) Level 2 (Red)      Shoulder Exercises: Standing   Internal Rotation Strengthening;20 reps   isometric   Extension Strengthening;Left;20 reps   Isometric and T band   Theraband Level (Shoulder Extension) Level 2 (Red)      Manual Therapy   Manual Therapy Joint mobilization;Passive ROM    Manual therapy comments ER @ 45 degrees ABD WNL    Joint Mobilization AP and PA mobs to improve  pt tolerance for ROM and to imrove joint mobility    Passive ROM 9 ABD, 120 flex, 70 ER '@45'$  deg ABD                    PT Education - 06/07/21 0836     Education Details Continue HEP, avoid painfuls shoulder activities    Person(s) Educated Patient    Methods Explanation    Comprehension Verbalized understanding              PT Short Term Goals - 05/25/21 1017       PT SHORT TERM GOAL #1   Title Patient will be independent in home exercise program to improve strength/mobility for better functional independence with ADLs.    Baseline Pt does not have HEP    Time 3    Period Weeks    Status New    Target Date 06/15/21      PT SHORT TERM GOAL #2   Title Patient will increase FOTO score to equal to or greater than   53  to demonstrate statistically significant improvement in mobility and quality of life.    Baseline 47 FOTO    Time 4    Period Weeks     Target Date 06/22/21      PT SHORT TERM GOAL #3   Title Pt will improve shoulder flexion and abduction AROM by 15 degrees each indicationg progress with funcitonal shoulder elevation    Baseline 80ABD, 105 Flexion    Time 4    Period Weeks    Status New    Target Date 06/22/21               PT Long Term Goals - 05/25/21 1020       PT LONG TERM GOAL #1   Title Patient will increase FOTO score to equal to or greater than  62   to demonstrate statistically significant improvement in mobility and quality of life.    Baseline 47    Time 8    Period Weeks    Status New    Target Date 07/20/21      PT LONG TERM GOAL #2   Title Pt will improve L shoulder strength to 4-/5 or greater with abd, flex and ER within her available ROM in order to indicate improved shoulder funciton    Baseline see flowsheet    Time 8    Period Weeks    Status New    Target Date 07/20/21      PT LONG TERM GOAL #3   Title Pt will report ability to complete all work related activities without c/o pain in either of her shoulders in order to indicate improved funciton and achiavement of pt stated goal.    Baseline Pt has to modify her work and has pain with many activities at work    Time 8    Period Weeks    Status New    Target Date 07/20/21                   Plan - 06/07/21 [id number]     Clinical Impression Statement Continued with current plan of care as laid out in evaluation. Pt remains motivated to advance progress toward goals. Rest breaks provided as needed, pt quick to ask when needed. Author maintains all interventions within appropriate level of intensity as not to purposefully exacerbate pain. Pt does require varying levels of assistance and cuing for completion of exercises  for correct form and sometimes due to pain/weakness. Pt continues to demonstrate progress toward goals AEB progression of some interventions this date either in volume or intensity. Pt continues ot have pain with  isometric ER but PROM has displayed slight improvements.    Personal Factors and Comorbidities Age;Comorbidity 1    Comorbidities pre-diabetes    Examination-Activity Limitations Carry;Lift;Reach Overhead    Examination-Participation Restrictions Occupation;Driving    Stability/Clinical Decision Making Stable/Uncomplicated    Clinical Decision Making Low    Rehab Potential Good    PT Frequency 2x / week    PT Duration 6 weeks    PT Treatment/Interventions ADLs/Self Care Home Management;Aquatic Therapy;Electrical Stimulation;Moist Heat;Ultrasound;DME Instruction;Functional mobility training;Therapeutic activities;Therapeutic exercise;Neuromuscular re-education;Patient/family education;Manual techniques;Passive range of motion;Energy conservation;Taping;Vasopneumatic Device;Joint Manipulations    PT Next Visit Plan Progress HEP as indicated    PT Home Exercise Plan Provided 05/30/21    Consulted and Agree with Plan of Care Patient           *Pt did not have interpreter today due to scheduling error. Pt verbalized consent to be treated without interpreter as she has some basic knowledge of [place] speaking. Interpreter will be requested for next visit.   Patient will benefit from skilled therapeutic intervention in order to improve the following deficits and impairments:  Decreased range of motion, Improper body mechanics, Decreased activity tolerance, Decreased strength, Impaired UE functional use  Visit Diagnosis: Acute pain of left shoulder  Stiffness of left shoulder, not elsewhere classified     Problem List Patient Active Problem List   Diagnosis Date Noted   Screen for colon cancer    Muscle cramps 05/28/2018   Pre-diabetes 05/28/2018   Fatty liver 05/28/2018   Hair loss 05/28/2018   Sciatica 11/01/2015   S/P right oophorectomy 11/01/2015   S/P hysterectomy 11/01/2015   Rivka Barbara PT, DPT   Particia Lather 06/07/2021, 8:49 AM  [place] [address] [place], [place], [address] Phone: [phone]   Fax:  [phone]  Name: Carolyn Sampson MRN: [id number] Date of Birth: 08-16-1961

## 2021-06-09 ENCOUNTER — Ambulatory Visit: Payer: 59 | Admitting: Physical Therapy

## 2021-06-13 ENCOUNTER — Ambulatory Visit: Payer: PRIVATE HEALTH INSURANCE

## 2021-06-13 ENCOUNTER — Other Ambulatory Visit: Payer: Self-pay

## 2021-06-13 DIAGNOSIS — M6281 Muscle weakness (generalized): Secondary | ICD-10-CM

## 2021-06-13 NOTE — Therapy (Signed)
[place] MAIN [facility] SERVICES [address] [place], [place], [address] Phone: [phone]   Fax:  [phone]  Physical Therapy Treatment  Patient Details  Name: Carolyn Sampson MRN: [id number] Date of Birth: [birth year] Referring Provider (PT): Mickle Asper [facility]   Encounter Date: 06/13/2021   PT End of Session - 06/13/21 1101     Visit Number 4    Number of Visits 12    Date for PT Re-Evaluation 07/06/21    Authorization Type WC    Authorization - Visit Number 4    Authorization - Number of Visits 12    PT Start Time [id number]    PT Stop Time 0905    PT Time Calculation (min) 48 min    Activity Tolerance Patient limited by pain;Patient tolerated treatment well    Behavior During Therapy [facility] for tasks assessed/performed             Past Medical History:  Diagnosis Date   Fatty liver    Pre-diabetes     Past Surgical History:  Procedure Laterality Date   ABDOMINAL HYSTERECTOMY     APPENDECTOMY     CESAREAN SECTION     CESAREAN SECTION     COLONOSCOPY WITH PROPOFOL N/A 06/02/2021   Procedure: COLONOSCOPY WITH PROPOFOL;  Surgeon: Lin Landsman, MD;  Location: [facility] ENDOSCOPY;  Service: Gastroenterology;  Laterality: N/A;  SPANISH INTERPRETER   LAPAROSCOPIC HYSTERECTOMY     LAPAROSCOPIC OOPHERECTOMY Right     There were no vitals filed for this visit.   Subjective Assessment - 06/13/21 0823     Subjective Patient reports ongoing left shoulder pain with work - Reports very heavy by end of day.    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 Is now performing normal duty but is having to modify activities and is not able to perform lifting activities such as taking the trash out.    Limitations Other (comment)    Patient Stated Goals To be able to perform work duties on the same routine as she had before.    Currently in Pain? Yes    Pain Score 4     Pain Location Arm     Pain Orientation Left    Pain Descriptors / Indicators Heaviness;Sharp    Pain Type Acute pain    Pain Onset More than a month ago    Pain Frequency Constant    Aggravating Factors  UE overhead function; ADL- dressing, Working, Lifting    Pain Relieving Factors Rest    Effect of Pain on Daily Activities DIfficulty performing job related activities and difficulty with ADLs.    Multiple Pain Sites No               INTERVENTIONS:  Pre-treatment ROM:   Shoulder Flex=120 deg * Shoulder ABD= 95 deg * Shoulder ER= 47 deg * Shoulder IR= 80 deg *= Indicates pain   Manual therapy:   Long axis distraction Left shoulder - gentle within pain tolerance.  Grade II joint mobs- P/A; A/P: Inferior glides x multiple sets of 30 bouts each in varying degrees of shoulder elevation.  PROM to left shoulder in all planes (ER at 45 deg of shoulder abd) in supine for ROM and improved joint mobility.  *Patient required constant cues to relax her arm during session exhibiting increased muscle guarding initially - she did improve with multiple cues and practice. Patient presents with empty end  feel with each plane of motion today.   Therapeutic Exercises:   Verbal review of HEP- including Isometric ER/ active shoulder flex within pain tolerance.   Attempted AAROM with use of short/thin PVC pipe- attempted shoulder flex/abd/ER each however patient experienced pain with all exercises.    Post Therapy ROM:   Shoulder Flex=140 deg * Shoulder ABD= 110 deg * Shoulder ER= 70 deg * *= Indicates pain  Modalities:  Cryotherapy- Ice to affected left shoulder in supine x 8 min.   Clinical Impression: Patient presents with good motivation during today's session and open to progressing HEP. Unable to progress with AAROM exercises due to ongoing pain with active movement. She did respond favorably to manual therapy eventually today with improved ability to relax during treatment and able to achieve more  ROM post treatment. She continues to exhibit pain limited mobility- empty end feel with manual ROM and painful initiation of movement and end range pain.  Patient will continue to benefit from skilled PT services to continue to progress her ROM, Improve pain, and restore functional strength to enable patient to return to work at full capacity.                       PT Education - 06/13/21 1100     Education Details Painfree HEP and exercise technique.    Person(s) Educated Patient    Methods Explanation;Demonstration;Tactile cues;Verbal cues    Comprehension Verbalized understanding;Returned demonstration;Verbal cues required;Tactile cues required;Need further instruction              PT Short Term Goals - 05/25/21 1017       PT SHORT TERM GOAL #1   Title Patient will be independent in home exercise program to improve strength/mobility for better functional independence with ADLs.    Baseline Pt does not have HEP    Time 3    Period Weeks    Status New    Target Date 06/15/21      PT SHORT TERM GOAL #2   Title Patient will increase FOTO score to equal to or greater than   53  to demonstrate statistically significant improvement in mobility and quality of life.    Baseline 47 FOTO    Time 4    Period Weeks    Target Date 06/22/21      PT SHORT TERM GOAL #3   Title Pt will improve shoulder flexion and abduction AROM by 15 degrees each indicationg progress with funcitonal shoulder elevation    Baseline 80ABD, 105 Flexion    Time 4    Period Weeks    Status New    Target Date 06/22/21               PT Long Term Goals - 05/25/21 1020       PT LONG TERM GOAL #1   Title Patient will increase FOTO score to equal to or greater than  62   to demonstrate statistically significant improvement in mobility and quality of life.    Baseline 47    Time 8    Period Weeks    Status New    Target Date 07/20/21      PT LONG TERM GOAL #2   Title Pt will  improve L shoulder strength to 4-/5 or greater with abd, flex and ER within her available ROM in order to indicate improved shoulder funciton    Baseline see flowsheet    Time 8  Period Weeks    Status New    Target Date 07/20/21      PT LONG TERM GOAL #3   Title Pt will report ability to complete all work related activities without c/o pain in either of her shoulders in order to indicate improved funciton and achiavement of pt stated goal.    Baseline Pt has to modify her work and has pain with many activities at work    Time 8    Period Weeks    Status New    Target Date 07/20/21                   Plan - 06/13/21 1101     Clinical Impression Statement Patient presents with good motivation during today's session and open to progressing HEP. Unable to progress with AAROM exercises due to ongoing pain with active movement. She did respond favorably to manual therapy eventually today with improved ability to relax during treatment and able to achieve more ROM post treatment. She continues to exhibit pain limited mobility- empty end feel with manual ROM and painful initiation of movement and end range pain.   Patient will continue to benefit from skilled PT services to continue to progress her ROM, Improve pain, and restore functional strength to enable patient to return to work at full capacity.    Personal Factors and Comorbidities Age;Comorbidity 1    Comorbidities pre-diabetes    Examination-Activity Limitations Carry;Lift;Reach Overhead    Examination-Participation Restrictions Occupation;Driving    Stability/Clinical Decision Making Stable/Uncomplicated    Rehab Potential Good    PT Frequency 2x / week    PT Duration 6 weeks    PT Treatment/Interventions ADLs/Self Care Home Management;Aquatic Therapy;Electrical Stimulation;Moist Heat;Ultrasound;DME Instruction;Functional mobility training;Therapeutic activities;Therapeutic exercise;Neuromuscular re-education;Patient/family  education;Manual techniques;Passive range of motion;Energy conservation;Taping;Vasopneumatic Device;Joint Manipulations    PT Next Visit Plan Progress HEP as indicated; Continue with Manual Therapy and progressive Therex as appropriate for ROM and improved functional use.    PT Home Exercise Plan Provided 05/30/21    Consulted and Agree with Plan of Care Patient             Patient will benefit from skilled therapeutic intervention in order to improve the following deficits and impairments:  Decreased range of motion, Improper body mechanics, Decreased activity tolerance, Decreased strength, Impaired UE functional use  Visit Diagnosis: Muscle weakness (generalized)     Problem List Patient Active Problem List   Diagnosis Date Noted   Screen for colon cancer    Muscle cramps 05/28/2018   Pre-diabetes 05/28/2018   Fatty liver 05/28/2018   Hair loss 05/28/2018   Sciatica 11/01/2015   S/P right oophorectomy 11/01/2015   S/P hysterectomy 11/01/2015    Lewis Moccasin, PT 06/13/2021, 1:16 PM  [place] MAIN [facility] SERVICES [address] [place], [place], [address] Phone: [phone]   Fax:  [phone]  Name: Carolyn Sampson MRN: [id number] Date of Birth: [birth year]

## 2021-06-15 ENCOUNTER — Ambulatory Visit: Payer: PRIVATE HEALTH INSURANCE | Admitting: Physical Therapy

## 2021-06-15 ENCOUNTER — Other Ambulatory Visit: Payer: Self-pay

## 2021-06-15 DIAGNOSIS — M25612 Stiffness of left shoulder, not elsewhere classified: Secondary | ICD-10-CM

## 2021-06-15 DIAGNOSIS — M6281 Muscle weakness (generalized): Secondary | ICD-10-CM | POA: Diagnosis not present

## 2021-06-15 DIAGNOSIS — M25512 Pain in left shoulder: Secondary | ICD-10-CM

## 2021-06-15 NOTE — Patient Instructions (Signed)
Access Code: [email or web address] URL: [email or web address] Date: 06/15/2021 Prepared by: Rivka Barbara  Exercises Seated Shoulder Flexion Towel Slide at Table Top - 1 x daily - 5 x weekly - 2 sets - 10 reps - 5 hold Seated Shoulder Scaption Slide at Table Top with Forearm in Neutral - 1 x daily - 5 x weekly - 2 sets - 10 reps - 5 hold

## 2021-06-15 NOTE — Therapy (Signed)
[place] MAIN [facility] SERVICES [address] [place], [place], [address] Phone: [phone]   Fax:  [phone]  Physical Therapy Treatment  Patient Details  Name: Carolyn Sampson MRN: [id number] Date of Birth: Feb 10, 1961 Referring Provider (PT): Mickle Asper [facility]   Encounter Date: 06/15/2021   PT End of Session - 06/15/21 0859     Visit Number 5    Number of Visits 12    Date for PT Re-Evaluation 07/06/21    Authorization Type WC    Authorization - Visit Number 4    Authorization - Number of Visits 12    Progress Note Due on Visit 10    PT Start Time 0815    PT Stop Time 0903    PT Time Calculation (min) 48 min    Activity Tolerance Patient limited by pain;Patient tolerated treatment well    Behavior During Therapy [facility] for tasks assessed/performed             Past Medical History:  Diagnosis Date   Fatty liver    Pre-diabetes     Past Surgical History:  Procedure Laterality Date   ABDOMINAL HYSTERECTOMY     APPENDECTOMY     CESAREAN SECTION     CESAREAN SECTION     COLONOSCOPY WITH PROPOFOL N/A 06/02/2021   Procedure: COLONOSCOPY WITH PROPOFOL;  Surgeon: Lin Landsman, MD;  Location: [facility] ENDOSCOPY;  Service: Gastroenterology;  Laterality: N/A;  SPANISH INTERPRETER   LAPAROSCOPIC HYSTERECTOMY     LAPAROSCOPIC OOPHERECTOMY Right     There were no vitals filed for this visit.   Subjective Assessment - 06/15/21 0818     Subjective Pt reports he session with jeff earlier this week went well. She reports some pain at the end of the day. She reports she has shooting pain at times but is unable to determine any predictable pattern to the pain.    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 Is now performing normal duty but is having to modify activities and is not able to perform lifting activities such as taking the trash  out.    Limitations Other (comment)    Patient Stated Goals To be able to perform work duties on the same routine as she had before.    Currently in Pain? Yes    Pain Score 4     Pain Location Arm    Pain Orientation Left    Pain Descriptors / Indicators Heaviness;Sharp    Pain Type Chronic pain    Pain Onset More than a month ago    Pain Frequency Constant              Shoulder mobs in 50 degrees ABD, PA and AP mobs felt the best, inferior mobs caused occassional Sharp pain, improved ability to relax shoulder throughout session with cues. grade 1 /2 gentle mobs.  Gentle ER, flexion, abd, and IR PROM in supine with towel roll under arm.  -Pain with elevation PROM but no pain noted with rotational movements with arm supported by towel.  With PROM pt able to obtain approximately 90 degrees flexion and abduction with open end feel due to pain and muscle guarding.   Therex:   Isometrics in 45 ABD for ER and IR:  2 x 30 seconds alternating ER and IR -min pain noted in shoulder on second set    Rows RTB  2 x 10  -min c/o pain with eccentric portion   Table slides- using treatmetn table and towel  flexion 10x 5 sec holds -no pain noted Scaption 10 x 5 sec -min pain noted and cues to only move through pain free range        Access Code: [email or web address] URL: [email or web address] Date: 06/15/2021 Prepared by: Rivka Barbara  Exercises Seated Shoulder Flexion Towel Slide at Table Top - 1 x daily - 5 x weekly - 2 sets - 10 reps - 5 hold Seated Shoulder Scaption Slide at Table Top with Forearm in Neutral - 1 x daily - 5 x weekly - 2 sets - 10 reps - 5 hold                PT Education - 06/15/21 0858     Education Details Access Code: [email or web address]  URL: [email or web address]  Date: 06/15/2021  Prepared by: Rivka Barbara    Exercises  Seated Shoulder Flexion Towel Slide at Table Top - 1 x daily - 5 x weekly - 2 sets - 10 reps - 5 hold   Seated Shoulder Scaption Slide at Table Top with Forearm in Neutral - 1 x daily - 5 x weekly - 2 sets - 10 reps - 5 hold    Person(s) Educated Patient    Methods Explanation;Handout    Comprehension Verbalized understanding;Returned demonstration;Verbal cues required;Tactile cues required;Need further instruction              PT Short Term Goals - 05/25/21 1017       PT SHORT TERM GOAL #1   Title Patient will be independent in home exercise program to improve strength/mobility for better functional independence with ADLs.    Baseline Pt does not have HEP    Time 3    Period Weeks    Status New    Target Date 06/15/21      PT SHORT TERM GOAL #2   Title Patient will increase FOTO score to equal to or greater than   53  to demonstrate statistically significant improvement in mobility and quality of life.    Baseline 47 FOTO    Time 4    Period Weeks    Target Date 06/22/21      PT SHORT TERM GOAL #3   Title Pt will improve shoulder flexion and abduction AROM by 15 degrees each indicationg progress with funcitonal shoulder elevation    Baseline 80ABD, 105 Flexion    Time 4    Period Weeks    Status New    Target Date 06/22/21               PT Long Term Goals - 05/25/21 1020       PT LONG TERM GOAL #1   Title Patient will increase FOTO score to equal to or greater than  62   to demonstrate statistically significant improvement in mobility and quality of life.    Baseline 47    Time 8    Period Weeks    Status New    Target Date 07/20/21      PT LONG TERM GOAL #2   Title Pt will improve L shoulder strength to 4-/5 or greater with abd, flex and ER within her available ROM in order to indicate improved shoulder funciton    Baseline see flowsheet    Time 8    Period Weeks    Status New    Target Date 07/20/21  PT LONG TERM GOAL #3   Title Pt will report ability to complete all work related activities without c/o pain in either of her shoulders in order to  indicate improved funciton and achiavement of pt stated goal.    Baseline Pt has to modify her work and has pain with many activities at work    Time 8    Period Weeks    Status New    Target Date 07/20/21                   Plan - 06/15/21 0859     Clinical Impression Statement Pt tolerated treatment session fairly as she was able to initiate muscle activation without c/o pain. Pt also able to initiate table slide exercise without pain, which is the first AAROM exercise pt has been able to complete without c/o pain in the shoulder. Pt continues to utilize ice post treatment session in order to alleviate pain and discomfort secondary to fatigue from therapy session. Pt continues to demonstrate significant muscle guarding with PROM and manual therapy. Pt will continue to benefit from skilled physical therapy intervention to address her impairments, improve his QOL, and attain her therapy goals.    Personal Factors and Comorbidities Age;Comorbidity 1    Comorbidities pre-diabetes    Examination-Activity Limitations Carry;Lift;Reach Overhead    Examination-Participation Restrictions Occupation;Driving    Stability/Clinical Decision Making Stable/Uncomplicated    Clinical Decision Making Low    Rehab Potential Good    PT Frequency 2x / week    PT Duration 6 weeks    PT Treatment/Interventions ADLs/Self Care Home Management;Aquatic Therapy;Electrical Stimulation;Moist Heat;Ultrasound;DME Instruction;Functional mobility training;Therapeutic activities;Therapeutic exercise;Neuromuscular re-education;Patient/family education;Manual techniques;Passive range of motion;Energy conservation;Taping;Vasopneumatic Device;Joint Manipulations    PT Next Visit Plan Progress HEP as indicated; Continue with Manual Therapy and progressive Therex as appropriate for ROM and improved functional use.    PT Home Exercise Plan Provided 05/30/21    Consulted and Agree with Plan of Care Patient              Patient will benefit from skilled therapeutic intervention in order to improve the following deficits and impairments:  Decreased range of motion, Improper body mechanics, Decreased activity tolerance, Decreased strength, Impaired UE functional use  Visit Diagnosis: Muscle weakness (generalized)  Acute pain of left shoulder  Stiffness of left shoulder, not elsewhere classified     Problem List Patient Active Problem List   Diagnosis Date Noted   Screen for colon cancer    Muscle cramps 05/28/2018   Pre-diabetes 05/28/2018   Fatty liver 05/28/2018   Hair loss 05/28/2018   Sciatica 11/01/2015   S/P right oophorectomy 11/01/2015   S/P hysterectomy 11/01/2015    Particia Lather, PT 06/15/2021, 1:14 PM  [place] [address] [place], [place], [address] Phone: [phone]   Fax:  [phone]  Name: Zakhia Burtnett MRN: [id number] Date of Birth: [birth year]

## 2021-06-20 ENCOUNTER — Ambulatory Visit: Payer: PRIVATE HEALTH INSURANCE

## 2021-06-21 ENCOUNTER — Other Ambulatory Visit: Payer: Self-pay

## 2021-06-21 ENCOUNTER — Ambulatory Visit: Payer: PRIVATE HEALTH INSURANCE | Admitting: Physical Therapy

## 2021-06-21 DIAGNOSIS — M25612 Stiffness of left shoulder, not elsewhere classified: Secondary | ICD-10-CM

## 2021-06-21 DIAGNOSIS — M6281 Muscle weakness (generalized): Secondary | ICD-10-CM | POA: Diagnosis present

## 2021-06-21 DIAGNOSIS — M25512 Pain in left shoulder: Secondary | ICD-10-CM | POA: Diagnosis not present

## 2021-06-21 NOTE — Therapy (Signed)
[place] MAIN [facility] SERVICES [address] [place], [place], [address] Phone: [phone]   Fax:  [phone]  Physical Therapy Treatment  Patient Details  Name: Carolyn Sampson MRN: [id number] Date of Birth: 1961/02/04 Referring Provider (PT): Mickle Asper [facility]   Encounter Date: 06/21/2021   PT End of Session - 06/21/21 1315     Visit Number 6    Number of Visits 12    Date for PT Re-Evaluation 07/06/21    Authorization Type WC    Authorization - Visit Number 5    Authorization - Number of Visits 12    Progress Note Due on Visit 10    PT Start Time 1146    PT Stop Time 1230    PT Time Calculation (min) 44 min    Equipment Utilized During Treatment Gait belt    Activity Tolerance Patient limited by pain;Patient tolerated treatment well    Behavior During Therapy [facility] for tasks assessed/performed             Past Medical History:  Diagnosis Date   Fatty liver    Pre-diabetes     Past Surgical History:  Procedure Laterality Date   ABDOMINAL HYSTERECTOMY     APPENDECTOMY     CESAREAN SECTION     CESAREAN SECTION     COLONOSCOPY WITH PROPOFOL N/A 06/02/2021   Procedure: COLONOSCOPY WITH PROPOFOL;  Surgeon: Lin Landsman, MD;  Location: [facility] ENDOSCOPY;  Service: Gastroenterology;  Laterality: N/A;  SPANISH INTERPRETER   LAPAROSCOPIC HYSTERECTOMY     LAPAROSCOPIC OOPHERECTOMY Right     There were no vitals filed for this visit.     Treatment provided this session  Therex:   Therex:    Isometrics in 45 ABD for ER and IR:  2 x 30 seconds alternating ER and IR -min pain noted in shoulder on second set      Rows RTB 2 x 10  -no c/o pain with eccentric portion today    Table slides- using treatmetn table and towel  flexion 10x 5 sec holds -no pain noted Scaption 10 x 5 sec -min pain noted and cues to only move through pain free range   ABD table slides x 2  -Pt reported pain with getting shoulder into  correct position, exercise held at this time as a result      Manual Therapy:  Towel roll under arm with the patient in supine for improved patient comfort.  Shoulder mobs in 50 degrees ABD, PA and AP mobs felt the best, improved ability to relax shoulder throughout session compared to previous sessions. grade 1 /2 gentle mobs.  Gentle mobilizations throughout range of motion to improve patient relaxation.  Gentle ER, flexion, abd, and IR PROM in supine to pt end range (open end feel for ABD and flex)  with towel roll under arm. Improved muscle guarding in today's session.  -Pain with elevation PROM but no pain noted with rotational movements with arm supported by towel.    full ER ROM in 45 deg ABD (PROM)  Improved flexion ROM to approximately 120 degrees Continued impairments with ABD (approximately 90 degrees)       No charge for ice on bilateral shoulders at end of session in order to provide some comfort for patient prior to her starting her workday after therapy.  X6 minutes.  Patient reports ice improves her pain levels significantly.  PT Education - 06/21/21 1315     Education Details educated regarding exercsie technique and form    Person(s) Educated Patient    Methods Explanation;Demonstration;Tactile cues;Verbal cues    Comprehension Verbalized understanding;Returned demonstration;Tactile cues required;Verbal cues required;Need further instruction              PT Short Term Goals - 05/25/21 1017       PT SHORT TERM GOAL #1   Title Patient will be independent in home exercise program to improve strength/mobility for better functional independence with ADLs.    Baseline Pt does not have HEP    Time 3    Period Weeks    Status New    Target Date 06/15/21      PT SHORT TERM GOAL #2   Title Patient will increase FOTO score to equal to or greater than   53  to demonstrate statistically significant improvement in mobility and quality  of life.    Baseline 47 FOTO    Time 4    Period Weeks    Target Date 06/22/21      PT SHORT TERM GOAL #3   Title Pt will improve shoulder flexion and abduction AROM by 15 degrees each indicationg progress with funcitonal shoulder elevation    Baseline 80ABD, 105 Flexion    Time 4    Period Weeks    Status New    Target Date 06/22/21               PT Long Term Goals - 05/25/21 1020       PT LONG TERM GOAL #1   Title Patient will increase FOTO score to equal to or greater than  62   to demonstrate statistically significant improvement in mobility and quality of life.    Baseline 47    Time 8    Period Weeks    Status New    Target Date 07/20/21      PT LONG TERM GOAL #2   Title Pt will improve L shoulder strength to 4-/5 or greater with abd, flex and ER within her available ROM in order to indicate improved shoulder funciton    Baseline see flowsheet    Time 8    Period Weeks    Status New    Target Date 07/20/21      PT LONG TERM GOAL #3   Title Pt will report ability to complete all work related activities without c/o pain in either of her shoulders in order to indicate improved funciton and achiavement of pt stated goal.    Baseline Pt has to modify her work and has pain with many activities at work    Time 8    Period Weeks    Status New    Target Date 07/20/21                   Plan - 06/21/21 1316     Clinical Impression Statement Patient tolerated treatment session well.  Patient reports that orthopedic doctor states she is improving and her range of motion is better.  She also reports he renewed her physical therapy so she can continue to come for further treatment.  Patient displayed improved shoulder flexion range of motion today as well as shoulder external/internal rotation and 45 degrees of abduction.  Patient continues to have difficulty with passive or active shoulder abduction but patient has made improvements in other areas.  Patient also  has significant discomfort along supraspinatus muscle  belly and reports this reproduces her pain all along her shoulder with palpation of this muscle.  Patient will continue to benefit from skilled physical therapy intervention in order to improve her ability to complete daily activities Veriloid complete activities at work and improve the overall function of her shoulder and improve her quality of life.    Personal Factors and Comorbidities Age;Comorbidity 1    Comorbidities pre-diabetes    Examination-Activity Limitations Carry;Lift;Reach Overhead    Examination-Participation Restrictions Occupation;Driving    Stability/Clinical Decision Making Stable/Uncomplicated    Clinical Decision Making Low    Rehab Potential Good    PT Frequency 2x / week    PT Duration 6 weeks    PT Treatment/Interventions ADLs/Self Care Home Management;Aquatic Therapy;Electrical Stimulation;Moist Heat;Ultrasound;DME Instruction;Functional mobility training;Therapeutic activities;Therapeutic exercise;Neuromuscular re-education;Patient/family education;Manual techniques;Passive range of motion;Energy conservation;Taping;Vasopneumatic Device;Joint Manipulations    PT Next Visit Plan Progress HEP as indicated; Continue with Manual Therapy and progressive Therex as appropriate for ROM and improved functional use.    PT Home Exercise Plan Provided 05/30/21    Consulted and Agree with Plan of Care Patient             Patient will benefit from skilled therapeutic intervention in order to improve the following deficits and impairments:  Decreased range of motion, Improper body mechanics, Decreased activity tolerance, Decreased strength, Impaired UE functional use  Visit Diagnosis: Muscle weakness (generalized)  Acute pain of left shoulder  Stiffness of left shoulder, not elsewhere classified     Problem List Patient Active Problem List   Diagnosis Date Noted   Screen for colon cancer    Muscle cramps 05/28/2018    Pre-diabetes 05/28/2018   Fatty liver 05/28/2018   Hair loss 05/28/2018   Sciatica 11/01/2015   S/P right oophorectomy 11/01/2015   S/P hysterectomy 11/01/2015    Particia Lather, PT 06/21/2021, 2:35 PM  [place] [address] [place], [place], [address] Phone: [phone]   Fax:  [phone]  Name: Arryana Tolleson MRN: [id number] Date of Birth: 1960-11-05

## 2021-06-22 ENCOUNTER — Ambulatory Visit: Payer: PRIVATE HEALTH INSURANCE | Attending: Surgical | Admitting: Physical Therapy

## 2021-06-22 DIAGNOSIS — M25612 Stiffness of left shoulder, not elsewhere classified: Secondary | ICD-10-CM

## 2021-06-22 DIAGNOSIS — M25512 Pain in left shoulder: Secondary | ICD-10-CM

## 2021-06-22 DIAGNOSIS — M6281 Muscle weakness (generalized): Secondary | ICD-10-CM

## 2021-06-22 NOTE — Therapy (Signed)
[place] MAIN [facility] SERVICES [address] [place], [place], [address] Phone: [phone]   Fax:  [phone]  Physical Therapy Treatment  Patient Details  Name: Carolyn Sampson MRN: [id number] Date of Birth: May 26, 1961 Referring Provider (PT): Mickle Asper [facility]   Encounter Date: 06/22/2021   PT End of Session - 06/22/21 0918     Visit Number 7    Number of Visits 12    Date for PT Re-Evaluation 07/06/21    Authorization Type WC    Authorization - Visit Number 6    Authorization - Number of Visits 12    Progress Note Due on Visit 10    PT Start Time 0848    PT Stop Time 0929    PT Time Calculation (min) 41 min    Equipment Utilized During Treatment Gait belt    Activity Tolerance Patient limited by pain;Patient tolerated treatment well    Behavior During Therapy [facility] for tasks assessed/performed             Past Medical History:  Diagnosis Date   Fatty liver    Pre-diabetes     Past Surgical History:  Procedure Laterality Date   ABDOMINAL HYSTERECTOMY     APPENDECTOMY     CESAREAN SECTION     CESAREAN SECTION     COLONOSCOPY WITH PROPOFOL N/A 06/02/2021   Procedure: COLONOSCOPY WITH PROPOFOL;  Surgeon: Lin Landsman, MD;  Location: [facility] ENDOSCOPY;  Service: Gastroenterology;  Laterality: N/A;  SPANISH INTERPRETER   LAPAROSCOPIC HYSTERECTOMY     LAPAROSCOPIC OOPHERECTOMY Right     There were no vitals filed for this visit.   Subjective Assessment - 06/22/21 0858     Subjective Pt reports she has continued pain and discomfort in her shoulder with work related activities, improved pain at rest today.    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 Is now performing normal duty but is having to modify activities and is not able to perform lifting activities such as taking the trash out.    Limitations Other (comment)     Patient Stated Goals To be able to perform work duties on the same routine as she had before.    Pain Score 0-No pain            Treatment provided this session  Therex:   Rows RTB 2 x 10  -No pain noted.  Cues for correcting excessive forward flexion and protraction of shoulder.   Table slides- using treatmetn table and towel  flexion 10x 5 sec holds -no pain noted Scaption 10 x 5 sec -no pain noted this session  Internal rotation with RTB -pt reported pain with ER , no pain with concentric portion (eccentric portion but pain was min)         Manual Therapy:  Shoulder mobs in 60 degrees ABD,  AP mobs felt the best, inferior mobs caused occassional Sharp pain, improved ability to relax shoulder throughout session with cues. grade 1 /2 gentle mobs.  Gentle ER, flexion, abd, and IR PROM in supine with towel roll under arm.  -Pain with elevation PROM but no pain noted with rotational movements with arm supported by towel.  With PROM pt able to obtain approximately 120 degrees flexion and abduction with open end feel due to pain and muscle guarding.       Pt educated throughout session  about proper posture and technique with exercises. Improved exercise technique, movement at target joints, use of target muscles after min to mod verbal, visual, tactile cues.                   PT Short Term Goals - 05/25/21 1017       PT SHORT TERM GOAL #1   Title Patient will be independent in home exercise program to improve strength/mobility for better functional independence with ADLs.    Baseline Pt does not have HEP    Time 3    Period Weeks    Status New    Target Date 06/15/21      PT SHORT TERM GOAL #2   Title Patient will increase FOTO score to equal to or greater than   53  to demonstrate statistically significant improvement in mobility and quality of life.    Baseline 47 FOTO    Time 4    Period Weeks    Target Date 06/22/21      PT SHORT TERM GOAL  #3   Title Pt will improve shoulder flexion and abduction AROM by 15 degrees each indicationg progress with funcitonal shoulder elevation    Baseline 80ABD, 105 Flexion    Time 4    Period Weeks    Status New    Target Date 06/22/21               PT Long Term Goals - 05/25/21 1020       PT LONG TERM GOAL #1   Title Patient will increase FOTO score to equal to or greater than  62   to demonstrate statistically significant improvement in mobility and quality of life.    Baseline 47    Time 8    Period Weeks    Status New    Target Date 07/20/21      PT LONG TERM GOAL #2   Title Pt will improve L shoulder strength to 4-/5 or greater with abd, flex and ER within her available ROM in order to indicate improved shoulder funciton    Baseline see flowsheet    Time 8    Period Weeks    Status New    Target Date 07/20/21      PT LONG TERM GOAL #3   Title Pt will report ability to complete all work related activities without c/o pain in either of her shoulders in order to indicate improved funciton and achiavement of pt stated goal.    Baseline Pt has to modify her work and has pain with many activities at work    Time 8    Period Weeks    Status New    Target Date 07/20/21                   Plan - 06/22/21 0920     Clinical Impression Statement Patient tolerated treatment session well.  Patient displayed improved shoulder flexion range of motion today as well as shoulder external/internal rotation and 45 degrees of abduction. Patient also has significant discomfort along supraspinatus muscle belly and proximal tendon and reports this reproduces her pain all along her shoulder with palpation of this muscle. Patient will continue to benefit from skilled physical therapy intervention in order to improve her ability to complete daily activities and complete activities at work and improve the overall function of her shoulder and improve her quality of life.    Personal  Factors and Comorbidities Age;Comorbidity 1  Comorbidities pre-diabetes    Examination-Activity Limitations Carry;Lift;Reach Overhead    Examination-Participation Restrictions Occupation;Driving    Stability/Clinical Decision Making Stable/Uncomplicated    Rehab Potential Good    PT Frequency 2x / week    PT Duration 6 weeks    PT Treatment/Interventions ADLs/Self Care Home Management;Aquatic Therapy;Electrical Stimulation;Moist Heat;Ultrasound;DME Instruction;Functional mobility training;Therapeutic activities;Therapeutic exercise;Neuromuscular re-education;Patient/family education;Manual techniques;Passive range of motion;Energy conservation;Taping;Vasopneumatic Device;Joint Manipulations    PT Next Visit Plan Progress HEP as indicated; Continue with Manual Therapy and progressive Therex as appropriate for ROM and improved functional use.    PT Home Exercise Plan Provided 05/30/21    Consulted and Agree with Plan of Care Patient             Patient will benefit from skilled therapeutic intervention in order to improve the following deficits and impairments:  Decreased range of motion, Improper body mechanics, Decreased activity tolerance, Decreased strength, Impaired UE functional use  Visit Diagnosis: Muscle weakness (generalized)  Acute pain of left shoulder  Stiffness of left shoulder, not elsewhere classified     Problem List Patient Active Problem List   Diagnosis Date Noted   Screen for colon cancer    Muscle cramps 05/28/2018   Pre-diabetes 05/28/2018   Fatty liver 05/28/2018   Hair loss 05/28/2018   Sciatica 11/01/2015   S/P right oophorectomy 11/01/2015   S/P hysterectomy 11/01/2015    Particia Lather, PT 06/22/2021, 12:47 PM  [place] [address] [place], [place], [address] Phone: [phone]   Fax:  [phone]  Name: Herma Uballe MRN: [id number] Date of Birth: 07-22-1961

## 2021-06-27 ENCOUNTER — Other Ambulatory Visit: Payer: Self-pay

## 2021-06-27 ENCOUNTER — Ambulatory Visit: Payer: PRIVATE HEALTH INSURANCE

## 2021-06-27 DIAGNOSIS — M6281 Muscle weakness (generalized): Secondary | ICD-10-CM | POA: Diagnosis not present

## 2021-06-27 NOTE — Therapy (Signed)
[place] MAIN [facility] SERVICES [address] [place], [place], [address] Phone: [phone]   Fax:  [phone]  Physical Therapy Treatment  Patient Details  Name: Carolyn Sampson MRN: [id number] Date of Birth: 1960-12-15 Referring Provider (PT): Mickle Asper [facility]   Encounter Date: 06/27/2021   PT End of Session - 06/27/21 0814     Visit Number 8    Number of Visits 12    Date for PT Re-Evaluation 07/06/21    Authorization Type WC    Authorization - Visit Number 6    Authorization - Number of Visits 12    Progress Note Due on Visit 10    PT Start Time 0815    Equipment Utilized During Treatment Gait belt    Activity Tolerance Patient limited by pain;Patient tolerated treatment well    Behavior During Therapy [facility] for tasks assessed/performed             Past Medical History:  Diagnosis Date   Fatty liver    Pre-diabetes     Past Surgical History:  Procedure Laterality Date   ABDOMINAL HYSTERECTOMY     APPENDECTOMY     CESAREAN SECTION     CESAREAN SECTION     COLONOSCOPY WITH PROPOFOL N/A 06/02/2021   Procedure: COLONOSCOPY WITH PROPOFOL;  Surgeon: Lin Landsman, MD;  Location: [facility] ENDOSCOPY;  Service: Gastroenterology;  Laterality: N/A;  SPANISH INTERPRETER   LAPAROSCOPIC HYSTERECTOMY     LAPAROSCOPIC OOPHERECTOMY Right     There were no vitals filed for this visit.   Subjective Assessment - 06/27/21 0812     Subjective I dont feel good today. I got hurt doing something at work yesterday - Feeling sharp pain in my left shoulder. I was lifting some dishes to place on machine.    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 Is now performing normal duty but is having to modify activities and is not able to perform lifting activities such as taking the trash out.    Limitations Other (comment)    Patient Stated Goals To be  able to perform work duties on the same routine as she had before.    Currently in Pain? Yes    Pain Score 9    Current: 9/10; worst= 10/10; best= 2-3/10   Pain Location Shoulder    Pain Orientation Left;Posterior;Lateral    Pain Descriptors / Indicators Sharp    Pain Type Acute pain    Pain Radiating Towards lateral Upper arm    Pain Onset Yesterday    Pain Frequency Constant    Aggravating Factors  UE overhead function; ADL- dressing, working, lifting    Pain Relieving Factors Rest    Effect of Pain on Daily Activities Difficulty performing job related tasks and difficulty with ADL's             INTERVENTIONS:    * Patient reports 9/10 left posteriolateral shoulder pain.  Herb Grays, Interpreter present today for PT Session and PC to MD.      Assessment of left shoulder:  Supine-  *PROM= flex 25 deg stopped due to acute Left shoulder pain *Patient unable to relax for further assessment and increased pain with lateral shoulder at 45 deg.  Patient was holding her left arm and unable to rest arm down in neutral position.   Discussed at length patient status and unable to progress through  PT session.  Patient received Ice pack to left shoulder in seated position x 10 min while PT contacted [facility].   Phone call to Dr. Bettina Gavia office ([place])  At [phone] and spoke with front office personnel to report above information.  Received call back from [employer], PA-C instructing patient to rest and use Tylenol or ibuprofen as needed and continue with ice. She advised patient to return to PT clinic on Wednesday and if no better contact MD office and they would work on bringing her back in for further consultation. Interpreter Herb Grays, present for phone call to relay info from patient to [employer], PA-C.   Clinical Impression: Patient unable to progress with PT and experienced more difficulty relaxing for ROM- unable to tolerate manual  techniques today. PC to Ortho MD office to notify of patient reporting more pain in left shoulder following working yesterday. Spoke with [employer], PA-C who spoke through speaker phone to PT, patient- Abbeygail, and interpreter- Herb Grays that the patient likely irritated her injury and she recommended continuing with Ice, taking Ibuprofen or tylenol for pain as needed to return to PT on Wednesday to resssess. She instructed that if patient was no better to contact MD office. Patient will continue to benefit from skilled physical therapy intervention in order to improve her ability to complete daily activities and complete activities at work and improve the overall function of her shoulder and improve her quality of life.                         PT Education - 06/27/21 0813     Education Details Emphasized importance of adhering to any lifting precautions- only picking up dishes/objects in painfree motion and report to supervisor if unable to perform certain job duties.    Person(s) Educated Patient    Methods Explanation;Demonstration;Verbal cues    Comprehension Verbalized understanding;Verbal cues required;Returned demonstration;Need further instruction              PT Short Term Goals - 05/25/21 1017       PT SHORT TERM GOAL #1   Title Patient will be independent in home exercise program to improve strength/mobility for better functional independence with ADLs.    Baseline Pt does not have HEP    Time 3    Period Weeks    Status New    Target Date 06/15/21      PT SHORT TERM GOAL #2   Title Patient will increase FOTO score to equal to or greater than   53  to demonstrate statistically significant improvement in mobility and quality of life.    Baseline 47 FOTO    Time 4    Period Weeks    Target Date 06/22/21      PT SHORT TERM GOAL #3   Title Pt will improve shoulder flexion and abduction AROM by 15 degrees each indicationg progress with funcitonal  shoulder elevation    Baseline 80ABD, 105 Flexion    Time 4    Period Weeks    Status New    Target Date 06/22/21               PT Long Term Goals - 05/25/21 1020       PT LONG TERM GOAL #1   Title Patient will increase FOTO score to equal to or greater than  62   to demonstrate statistically significant improvement in mobility and quality of life.  Baseline 47    Time 8    Period Weeks    Status New    Target Date 07/20/21      PT LONG TERM GOAL #2   Title Pt will improve L shoulder strength to 4-/5 or greater with abd, flex and ER within her available ROM in order to indicate improved shoulder funciton    Baseline see flowsheet    Time 8    Period Weeks    Status New    Target Date 07/20/21      PT LONG TERM GOAL #3   Title Pt will report ability to complete all work related activities without c/o pain in either of her shoulders in order to indicate improved funciton and achiavement of pt stated goal.    Baseline Pt has to modify her work and has pain with many activities at work    Time 8    Period Weeks    Status New    Target Date 07/20/21                   Plan - 06/27/21 0814     Clinical Impression Statement Patient unable to progress with PT and experienced more difficulty relaxing for ROM- unable to tolerate manual techniques today. PC to Ortho MD office to notify of patient reporting more pain in left shoulder following working yesterday. Spoke with [employer], PA-C who spoke through speaker phone to PT, patient- Samyrah, and interpreter- Herb Grays that the patient likely irritated her injury and she recommended continuing with Ice, taking Ibuprofen or tylenol for pain as needed to return to PT on Wednesday to resssess. She instructed that if patient was no better to contact MD office. Patient will continue to benefit from skilled physical therapy intervention in order to improve her ability to complete daily activities and complete activities at  work and improve the overall function of her shoulder and improve her quality of life.    Personal Factors and Comorbidities Age;Comorbidity 1    Comorbidities pre-diabetes    Examination-Activity Limitations Carry;Lift;Reach Overhead    Examination-Participation Restrictions Occupation;Driving    Stability/Clinical Decision Making Stable/Uncomplicated    Rehab Potential Good    PT Frequency 2x / week    PT Duration 6 weeks    PT Treatment/Interventions ADLs/Self Care Home Management;Aquatic Therapy;Electrical Stimulation;Moist Heat;Ultrasound;DME Instruction;Functional mobility training;Therapeutic activities;Therapeutic exercise;Neuromuscular re-education;Patient/family education;Manual techniques;Passive range of motion;Energy conservation;Taping;Vasopneumatic Device;Joint Manipulations    PT Next Visit Plan Progress HEP as indicated; Continue with Manual Therapy and progressive Therex as appropriate for ROM and improved functional use.    PT Home Exercise Plan Provided 05/30/21; no updates today.    Consulted and Agree with Plan of Care Patient             Patient will benefit from skilled therapeutic intervention in order to improve the following deficits and impairments:  Decreased range of motion, Improper body mechanics, Decreased activity tolerance, Decreased strength, Impaired UE functional use  Visit Diagnosis: Muscle weakness (generalized)     Problem List Patient Active Problem List   Diagnosis Date Noted   Screen for colon cancer    Muscle cramps 05/28/2018   Pre-diabetes 05/28/2018   Fatty liver 05/28/2018   Hair loss 05/28/2018   Sciatica 11/01/2015   S/P right oophorectomy 11/01/2015   S/P hysterectomy 11/01/2015    Lewis Moccasin, PT 06/27/2021, 11:01 AM  [place] [address] [place], [place], [address] Phone:  [phone]   Fax:  [phone]  Name: Itxel Wickard MRN:  [id number] Date of Birth: 03-18-1961

## 2021-06-29 ENCOUNTER — Ambulatory Visit: Payer: PRIVATE HEALTH INSURANCE | Admitting: Physical Therapy

## 2021-06-29 ENCOUNTER — Other Ambulatory Visit: Payer: Self-pay

## 2021-06-29 DIAGNOSIS — M25612 Stiffness of left shoulder, not elsewhere classified: Secondary | ICD-10-CM

## 2021-06-29 DIAGNOSIS — M6281 Muscle weakness (generalized): Secondary | ICD-10-CM

## 2021-06-29 DIAGNOSIS — M25512 Pain in left shoulder: Secondary | ICD-10-CM

## 2021-06-29 NOTE — Therapy (Signed)
[place] MAIN [facility] SERVICES [address] [place], [place], [address] Phone: [phone]   Fax:  [phone]  Physical Therapy Treatment  Patient Details  Name: Carolyn Sampson MRN: [id number] Date of Birth: 06-03-1961 Referring Provider (PT): Mickle Asper [facility]   Encounter Date: 06/29/2021   PT End of Session - 06/29/21 0924     Visit Number 9    Number of Visits 12    Date for PT Re-Evaluation 07/06/21    Authorization Type WC    Authorization - Visit Number 8    Authorization - Number of Visits 12    Progress Note Due on Visit 10    PT Start Time 0845    PT Stop Time 0929    PT Time Calculation (min) 44 min    Activity Tolerance Patient limited by pain;Patient tolerated treatment well    Behavior During Therapy [facility] for tasks assessed/performed             Past Medical History:  Diagnosis Date   Fatty liver    Pre-diabetes     Past Surgical History:  Procedure Laterality Date   ABDOMINAL HYSTERECTOMY     APPENDECTOMY     CESAREAN SECTION     CESAREAN SECTION     COLONOSCOPY WITH PROPOFOL N/A 06/02/2021   Procedure: COLONOSCOPY WITH PROPOFOL;  Surgeon: Lin Landsman, MD;  Location: [facility] ENDOSCOPY;  Service: Gastroenterology;  Laterality: N/A;  SPANISH INTERPRETER   LAPAROSCOPIC HYSTERECTOMY     LAPAROSCOPIC OOPHERECTOMY Right     There were no vitals filed for this visit.   Subjective Assessment - 06/29/21 0848     Subjective Pt reports her pain in her arm has improved since her previous physical therapy session. Pt reports continued pain up to 8-9/10 when she is completing activities at work. Pt is still having limited motion compared to last week physical therpay session.    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 Is now performing normal duty but is having to modify activities and is not able to perform lifting  activities such as taking the trash out.    Limitations Other (comment)    Patient Stated Goals To be able to perform work duties on the same routine as she had before.    Currently in Pain? Yes    Pain Score 5     Pain Location Shoulder    Pain Orientation Right;Left;Lateral    Pain Descriptors / Indicators Sore    Pain Type Chronic pain    Pain Onset More than a month ago             Treatment provided this session  Therex:  Flexion table slides.  X10 with gentle flexion range of motion.  Patient encouraged not to push into pain.  Patient still able to obtain approximately 75 degrees of flexion range of motion end of exercise.  Patient did report she felt she could not move her arm and increased range towards end of repetitions less complaints of pain.  Patient instructed to perform shoulder rows and pain-free range of motion was provided red Thera-Band to replace black Thera-Band she reported she was using at home.        Manual Therapy:  Gentle grade 1-2 AP and inferior mobilizations for about 5 minutes.  Patient noted increasing pain with inferior glenohumeral mobilizations. Gentle and pain-free passive range  of motion into external rotation at 45 degrees abduction with arm on towel prop in supine.  Patient noted pain with about 45 degrees of external rotation with open end feel comparison to previous session patient was able to obtain 80 degrees of external rotation pain-free. -Shoulder abduction with gentle inferior glenohumeral mobilizations.  Patient able to obtain approximately 50 degrees of abduction compared to previous sessions where she was able to obtain 90 degrees of abduction before onset of pain.  Gently question to this range but patient was unable to obtain past 50 degrees on this date. -Shoulder flexion passive range of motion patient able to obtain approximately 75 degrees of flexion range of motion at this time.  Patient continues to demonstrate increased  muscle guarding as compared to previous sessions and also has open end feel due to pain.  In previous sessions patient achieved approximately 120 degrees of flexion before onset of pain.   Pt educated throughout session about proper posture and technique with exercises. Improved exercise technique, movement at target joints, use of target muscles after min to mod verbal, visual, tactile cues.   Note: Portions of this document were prepared using Dragon voice recognition software and although reviewed may contain unintentional dictation errors in syntax, grammar, or spelling.                            PT Short Term Goals - 05/25/21 1017       PT SHORT TERM GOAL #1   Title Patient will be independent in home exercise program to improve strength/mobility for better functional independence with ADLs.    Baseline Pt does not have HEP    Time 3    Period Weeks    Status New    Target Date 06/15/21      PT SHORT TERM GOAL #2   Title Patient will increase FOTO score to equal to or greater than   53  to demonstrate statistically significant improvement in mobility and quality of life.    Baseline 47 FOTO    Time 4    Period Weeks    Target Date 06/22/21      PT SHORT TERM GOAL #3   Title Pt will improve shoulder flexion and abduction AROM by 15 degrees each indicationg progress with funcitonal shoulder elevation    Baseline 80ABD, 105 Flexion    Time 4    Period Weeks    Status New    Target Date 06/22/21               PT Long Term Goals - 05/25/21 1020       PT LONG TERM GOAL #1   Title Patient will increase FOTO score to equal to or greater than  62   to demonstrate statistically significant improvement in mobility and quality of life.    Baseline 47    Time 8    Period Weeks    Status New    Target Date 07/20/21      PT LONG TERM GOAL #2   Title Pt will improve L shoulder strength to 4-/5 or greater with abd, flex and ER within her available  ROM in order to indicate improved shoulder funciton    Baseline see flowsheet    Time 8    Period Weeks    Status New    Target Date 07/20/21      PT LONG TERM GOAL #3   Title Pt  will report ability to complete all work related activities without c/o pain in either of her shoulders in order to indicate improved funciton and achiavement of pt stated goal.    Baseline Pt has to modify her work and has pain with many activities at work    Time [place]    Target Date 07/20/21                   Plan - 06/29/21 0925     Clinical Impression Statement Pt better able to tolerate physical therpay session today as compared to previous session. Pt continues to demonstrate impaired range of motion and increased pain and muscle guarding compared to previous sessions last week. Pt has agreed to allow PT to contact case manager and PA regarding moving her apppointment up in order to ensure pt has not sustained any new injury to the shoulder with activities performed at work last week. Pt will continue to benefit form skilled physical therpay intervention in order to improve her shoulder range of motion, strength, funciton and improve her ability to complete activities at work, home and in community    Personal Factors and Comorbidities Age;Comorbidity 1    Comorbidities pre-diabetes    Examination-Activity Limitations Carry;Lift;Reach Overhead    Examination-Participation Restrictions Occupation;Driving    Stability/Clinical Decision Making Stable/Uncomplicated    Rehab Potential Good    PT Frequency 2x / week    PT Duration 6 weeks    PT Treatment/Interventions ADLs/Self Care Home Management;Aquatic Therapy;Electrical Stimulation;Moist Heat;Ultrasound;DME Instruction;Functional mobility training;Therapeutic activities;Therapeutic exercise;Neuromuscular re-education;Patient/family education;Manual techniques;Passive range of motion;Energy conservation;Taping;Vasopneumatic  Device;Joint Manipulations    PT Next Visit Plan Progress HEP as indicated; Continue with Manual Therapy and progressive Therex as appropriate for ROM and improved functional use.    PT Home Exercise Plan Provided 05/30/21; no updates today.    Consulted and Agree with Plan of Care Patient             Patient will benefit from skilled therapeutic intervention in order to improve the following deficits and impairments:  Decreased range of motion, Improper body mechanics, Decreased activity tolerance, Decreased strength, Impaired UE functional use  Visit Diagnosis: Muscle weakness (generalized)  Acute pain of left shoulder  Stiffness of left shoulder, not elsewhere classified     Problem List Patient Active Problem List   Diagnosis Date Noted   Screen for colon cancer    Muscle cramps 05/28/2018   Pre-diabetes 05/28/2018   Fatty liver 05/28/2018   Hair loss 05/28/2018   Sciatica 11/01/2015   S/P right oophorectomy 11/01/2015   S/P hysterectomy 11/01/2015    Particia Lather, PT 06/29/2021, 9:31 AM  [facility] [address] [place], [place], [address] Phone: [phone]   Fax:  [phone]  Name: Carolyn Sampson MRN: [id number] Date of Birth: 03-02-1961

## 2021-07-01 ENCOUNTER — Other Ambulatory Visit: Payer: Self-pay | Admitting: Orthopedic Surgery

## 2021-07-01 DIAGNOSIS — M67912 Unspecified disorder of synovium and tendon, left shoulder: Secondary | ICD-10-CM

## 2021-07-04 ENCOUNTER — Ambulatory Visit: Payer: PRIVATE HEALTH INSURANCE | Attending: Surgical | Admitting: Physical Therapy

## 2021-07-04 DIAGNOSIS — M25612 Stiffness of left shoulder, not elsewhere classified: Secondary | ICD-10-CM | POA: Insufficient documentation

## 2021-07-04 DIAGNOSIS — M25512 Pain in left shoulder: Secondary | ICD-10-CM | POA: Insufficient documentation

## 2021-07-04 DIAGNOSIS — M6281 Muscle weakness (generalized): Secondary | ICD-10-CM | POA: Insufficient documentation

## 2021-07-06 ENCOUNTER — Ambulatory Visit: Payer: PRIVATE HEALTH INSURANCE | Admitting: Physical Therapy

## 2021-07-11 ENCOUNTER — Ambulatory Visit: Payer: PRIVATE HEALTH INSURANCE | Admitting: Physical Therapy

## 2021-07-11 ENCOUNTER — Other Ambulatory Visit: Payer: Self-pay

## 2021-07-11 DIAGNOSIS — M6281 Muscle weakness (generalized): Secondary | ICD-10-CM | POA: Diagnosis present

## 2021-07-11 DIAGNOSIS — M25512 Pain in left shoulder: Secondary | ICD-10-CM | POA: Diagnosis present

## 2021-07-11 DIAGNOSIS — M25612 Stiffness of left shoulder, not elsewhere classified: Secondary | ICD-10-CM | POA: Diagnosis present

## 2021-07-11 NOTE — Therapy (Signed)
[place] MAIN [facility] SERVICES [address] [place], [place], [address] Phone: [phone]   Fax:  [phone]  Physical Therapy Treatment/ Physical Therapy Progress Note   Dates of reporting period  05/25/21   to   07/11/21   Patient Details  Name: Carolyn Sampson MRN: [id number] Date of Birth: 28-Feb-1961 Referring Provider (PT): Mickle Asper [facility]   Encounter Date: 07/11/2021   PT End of Session - 07/11/21 0902     Visit Number 10    Number of Visits 12    Date for PT Re-Evaluation 07/06/21    Authorization Type WC    Authorization - Visit Number 8    Authorization - Number of Visits 12    Progress Note Due on Visit 10    PT Start Time 0830    PT Stop Time 0910    PT Time Calculation (min) 40 min    Equipment Utilized During Treatment Gait belt    Activity Tolerance Patient limited by pain;Patient tolerated treatment well    Behavior During Therapy [facility] for tasks assessed/performed             Past Medical History:  Diagnosis Date   Fatty liver    Pre-diabetes     Past Surgical History:  Procedure Laterality Date   ABDOMINAL HYSTERECTOMY     APPENDECTOMY     CESAREAN SECTION     CESAREAN SECTION     COLONOSCOPY WITH PROPOFOL N/A 06/02/2021   Procedure: COLONOSCOPY WITH PROPOFOL;  Surgeon: Lin Landsman, MD;  Location: [facility] ENDOSCOPY;  Service: Gastroenterology;  Laterality: N/A;  SPANISH INTERPRETER   LAPAROSCOPIC HYSTERECTOMY     LAPAROSCOPIC OOPHERECTOMY Right     There were no vitals filed for this visit.   Subjective Assessment - 07/11/21 0833     Subjective Pt reports her shoudler pain has persisted with any active elevation of left shoulder. 8/10 pain with shoulder elveation. Pt reports she will be receiving further shoulder imaging tomorrow.    Patient is accompained by: Interpreter              Treatment provided this session is listed and explained below.  Treatment was limited from today's  session due to patient's continued reported increase in shoulder pain with any activities of the shoulder.  Patient even reports pain with resting shoulder on elevated surfaces such as tables.      Manual Therapy:  Passive range of motion in all directions with gentle mobilizations performed throughout in order to improve patient's pain levels as well as to improve muscle guarding.  X23 minutes -Significant muscle guarding due to pain with abduction and flexion range of motion.  Despite cues for further relaxation patient unable to relax due to intensity of pain levels in her shoulder.  Able to elevate to approximately 50 degrees with both abduction and flexion with open infield due to increase in pain. -External rotation and internal rotation at 30 degrees of abduction with shoulder resting on towel roll.  No increase in pain able to obtain approximately 45 degrees external rotation and full internal rotation at this point in shoulder abduction. Soft tissue mobilization to biceps and rotator cuff muscles.  Patient reports significant pain with any palpation of these muscle groups as well as with areas above and below reported painful areas.  X5 minutes  Patient is concerned with her doctor bringing her out of work because she does not have an active income while she is not working.  Patient was educated regarding potential benefits of a short-term or long-term disability insurance and benefit.  Patient verbalized understanding and will look into these benefits during future open enrollment for health insurance and other insurance benefits period.    Pt educated throughout session about proper posture and technique with exercises. Improved exercise technique, movement at target joints, use of target muscles after min to mod verbal, visual, tactile cues.                           PT Education - 07/11/21 0901     Education Details Pt educated regarding progress and how  recent injury at work has impacted prgressa t this time    Person(s) Educated Patient    Methods Explanation    Comprehension Verbalized understanding              PT Short Term Goals - 07/11/21 0914       PT SHORT TERM GOAL #1   Title Patient will be independent in home exercise program to improve strength/mobility for better functional independence with ADLs.    Baseline Pt does not have HEP, pt was completing HEP prior to recent shoulder exacerbation during work activity    Time 3    Period Weeks    Status Partially Met    Target Date 08/01/21      PT SHORT TERM GOAL #2   Title Patient will increase FOTO score to equal to or greater than   53  to demonstrate statistically significant improvement in mobility and quality of life.    Baseline 47 FOTO, 41 at MN    Time 4    Period Weeks    Status Not Met    Target Date 08/08/21      PT SHORT TERM GOAL #3   Title Pt will improve shoulder flexion and abduction AROM by 15 degrees each indicationg progress with funcitonal shoulder elevation    Baseline 80ABD, 105 Flexion at IE, 50 abd and 50 flex at PN 10/10    Time 4    Period Weeks    Status New    Target Date 08/08/21               PT Long Term Goals - 07/11/21 0916       PT LONG TERM GOAL #1   Title Patient will increase FOTO score to equal to or greater than  62   to demonstrate statistically significant improvement in mobility and quality of life.    Baseline 47, 41 @ PN    Time 8    Period Weeks    Status Not Met    Target Date 09/05/21      PT LONG TERM GOAL #2   Title Pt will improve L shoulder strength to 4-/5 or greater with abd, flex and ER within her available ROM in order to indicate improved shoulder funciton    Baseline see flowsheet, not met, unable to actively elevate shoulder without pain    Time 8    Period Weeks    Status New    Target Date 09/05/21      PT LONG TERM GOAL #3   Title Pt will report ability to complete all work related  activities without c/o pain in either of her shoulders in order to indicate improved funciton and achiavement of pt stated goal.    Baseline Pt has to modify her work and has pain with many activities  at work, not met, pt recently taken out of work due ot shoulder exacerbation    Time 8    Period Weeks    Status New    Target Date 09/05/21                   Plan - 07/11/21 0903     Clinical Impression Statement Patient presents to physical therapy following 9 physical therapy visits for a progress note at this time.  Patient was initially making good progress with therapy and had improved shoulder range of motion and improved shoulder pain levels during her daily activities.  Several weeks ago patient had recent exacerbation of injury when performing work-related task.  Since then patient has displayed limited shoulder range of motion and increased levels of shoulder pain up to 8 of 10 with any shoulder elevation activities patient reports severely limiting shoulder function including inability to drive and inability to rest involved upper extremity on table when performing dinner and eating tasks.  Patient will be receiving further imaging of her shoulder tomorrow and has been seen by her medical doctor regarding this update.  Patient will continue to benefit from skilled physical therapy intervention in order to improve her shoulder range of motion, improve her shoulder pain, and improve her overall function. Patient's condition has the potential to improve in response to therapy. Maximum improvement is yet to be obtained. The anticipated improvement is attainable and reasonable in a generally predictable time.      Personal Factors and Comorbidities Age;Comorbidity 1    Comorbidities pre-diabetes    Examination-Activity Limitations Carry;Lift;Reach Overhead    Examination-Participation Restrictions Occupation;Driving    Stability/Clinical Decision Making Stable/Uncomplicated    Rehab  Potential Good    PT Frequency 2x / week    PT Duration 6 weeks    PT Treatment/Interventions ADLs/Self Care Home Management;Aquatic Therapy;Electrical Stimulation;Moist Heat;Ultrasound;DME Instruction;Functional mobility training;Therapeutic activities;Therapeutic exercise;Neuromuscular re-education;Patient/family education;Manual techniques;Passive range of motion;Energy conservation;Taping;Vasopneumatic Device;Joint Manipulations    PT Next Visit Plan Progress HEP as indicated; Continue with Manual Therapy and progressive Therex as appropriate for ROM and improved functional use.    PT Home Exercise Plan Provided 05/30/21; no updates today.    Consulted and Agree with Plan of Care Patient             Patient will benefit from skilled therapeutic intervention in order to improve the following deficits and impairments:  Decreased range of motion, Improper body mechanics, Decreased activity tolerance, Decreased strength, Impaired UE functional use  Visit Diagnosis: Muscle weakness (generalized)  Acute pain of left shoulder  Stiffness of left shoulder, not elsewhere classified     Problem List Patient Active Problem List   Diagnosis Date Noted   Screen for colon cancer    Muscle cramps 05/28/2018   Pre-diabetes 05/28/2018   Fatty liver 05/28/2018   Hair loss 05/28/2018   Sciatica 11/01/2015   S/P right oophorectomy 11/01/2015   S/P hysterectomy 11/01/2015    Particia Lather, PT 07/11/2021, 10:41 AM  [place] [address] [place], [place], [address] Phone: [phone]   Fax:  [phone]  Name: Carolyn Sampson MRN: [id number] Date of Birth: 1961-02-05

## 2021-07-12 ENCOUNTER — Ambulatory Visit
Admission: RE | Admit: 2021-07-12 | Discharge: 2021-07-12 | Disposition: A | Discharge status: Home / Self Care | Payer: PRIVATE HEALTH INSURANCE | Source: Ambulatory Visit | Attending: Orthopedic Surgery | Admitting: Orthopedic Surgery

## 2021-07-12 DIAGNOSIS — M67912 Unspecified disorder of synovium and tendon, left shoulder: Secondary | ICD-10-CM | POA: Insufficient documentation

## 2021-07-12 IMAGING — MR MR SHOULDER*L* W/O CM
4 of 5 series · 31 of 40 positions shown · non-contrast
Comparison: None.

CLINICAL DATA: Patient complains of left shoulder pain and limited
range of motion. Pulling injury, [DATE]. No history of surgery
reported.

EXAM:
MRI OF THE LEFT SHOULDER WITHOUT CONTRAST
TECHNIQUE: Multiplanar, multisequence MR imaging of the shoulder was performed.
No intravenous contrast was administered.

[Series 3: T2 fat-sat · axial · left · 4.0mm · 0.44mm/px · z∈[-93,+27]mm · 8 of 26 slices shown (1 of 3)]
[im 1/26]
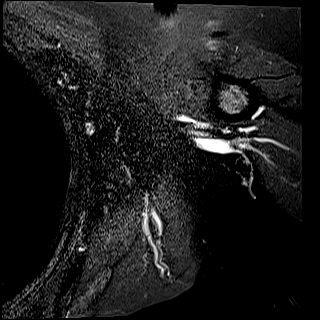
[im 4/26]
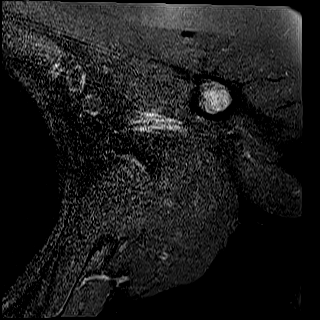
[im 8/26]
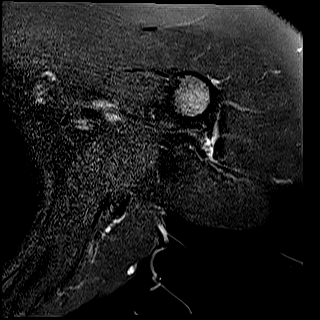
[im 11/26]
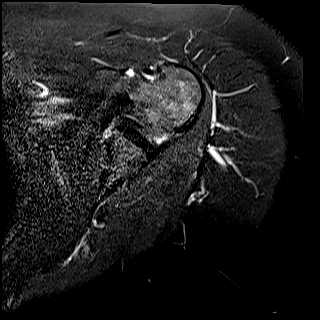
[im 15/26]
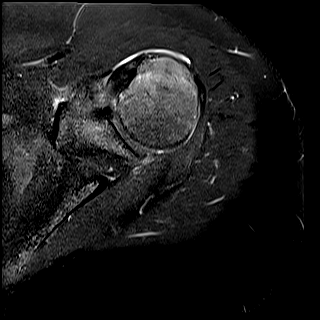
[im 18/26]
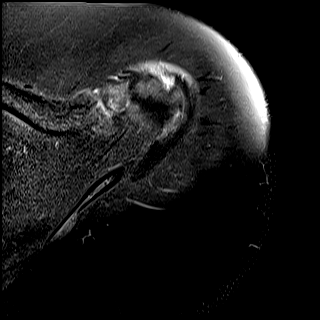
[im 22/26]
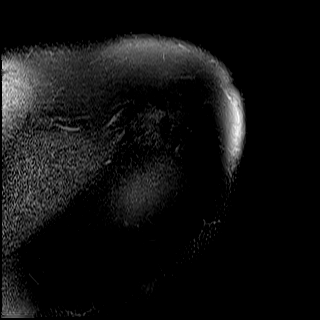
[im 26/26]
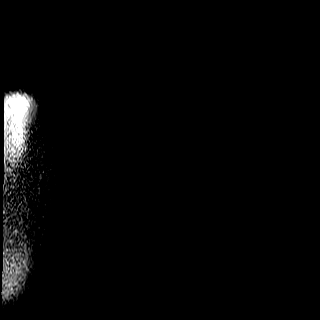

[Series 4: PD · oblique · left · 4.0mm · 0.44mm/px · 9 of 26 slices shown]
[im 1/26]
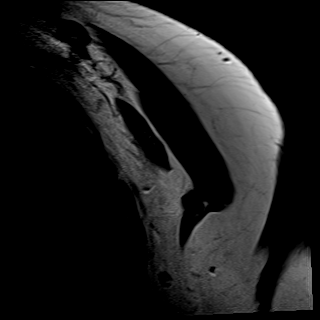
[im 4/26]
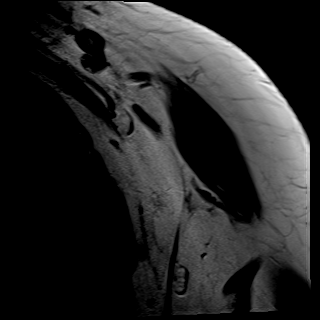
[im 7/26]
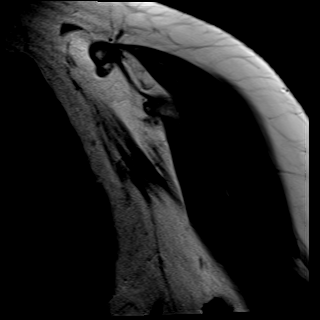
[im 10/26]
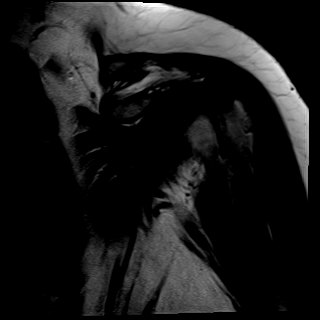
[im 13/26]
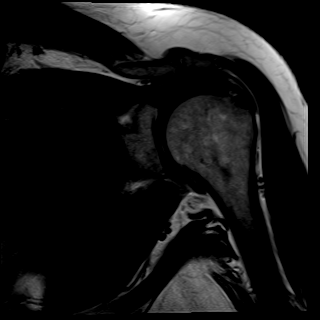
[im 16/26]
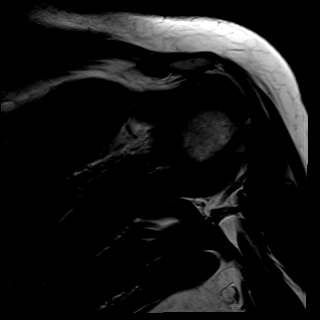
[im 19/26]
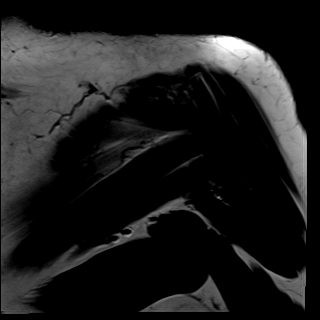
[im 22/26]
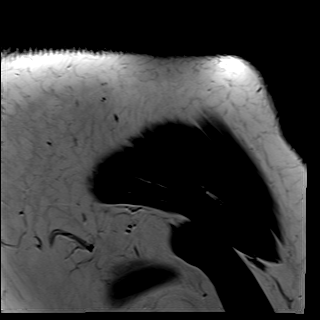
[im 26/26]
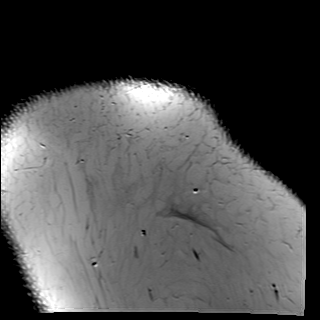

[Series 5: T2 fat-sat · oblique · left · 4.0mm · 0.44mm/px · 9 of 26 slices shown (2 of 3)]
[im 1/26]
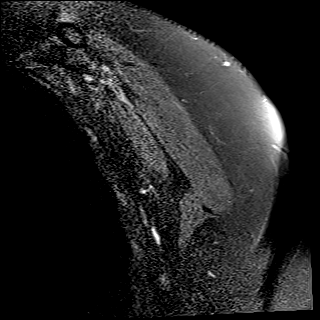
[im 4/26]
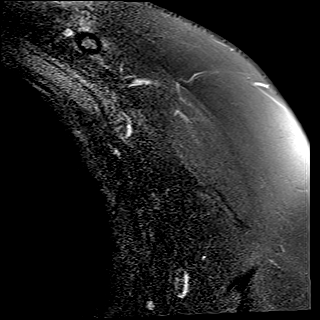
[im 7/26]
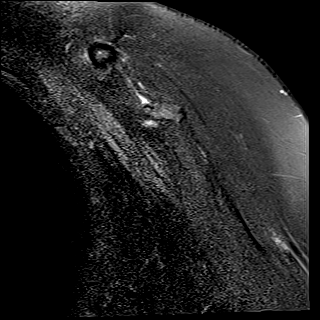
[im 10/26]
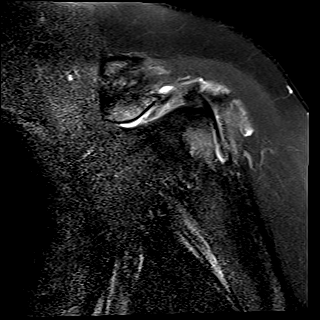
[im 13/26]
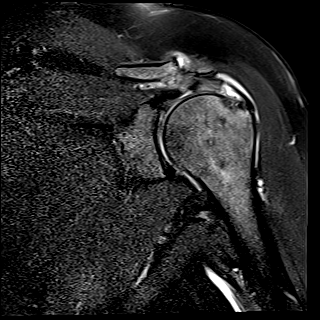
[im 16/26]
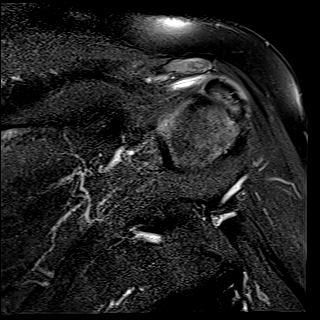
[im 19/26]
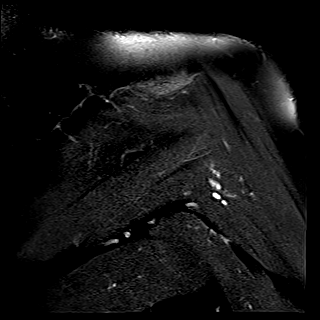
[im 22/26]
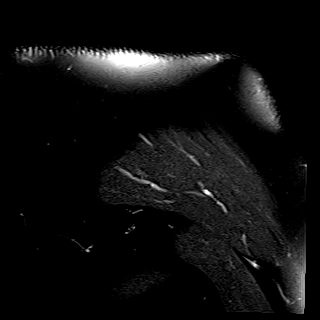
[im 26/26]
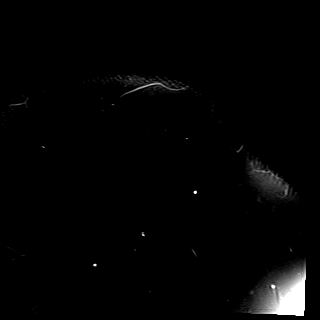

[Series 6: T2 fat-sat · oblique · left · 4.0mm · 0.22mm/px · 5 of 22 slices shown (3 of 3)]
[im 1/22]
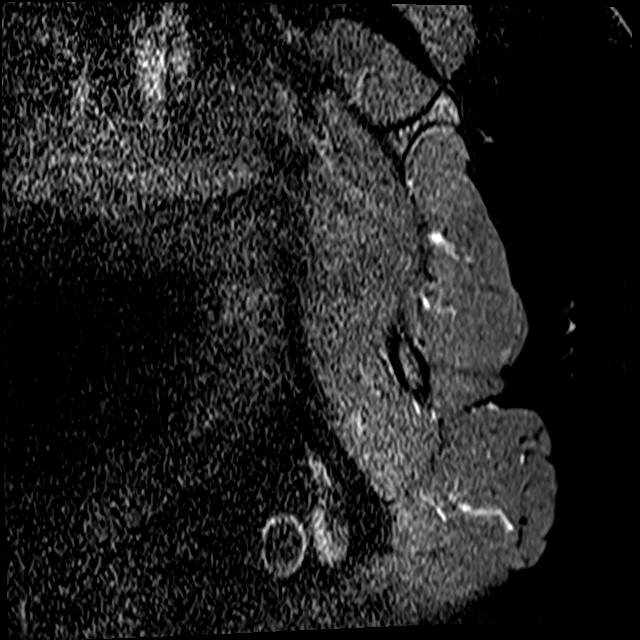
[im 4/22]
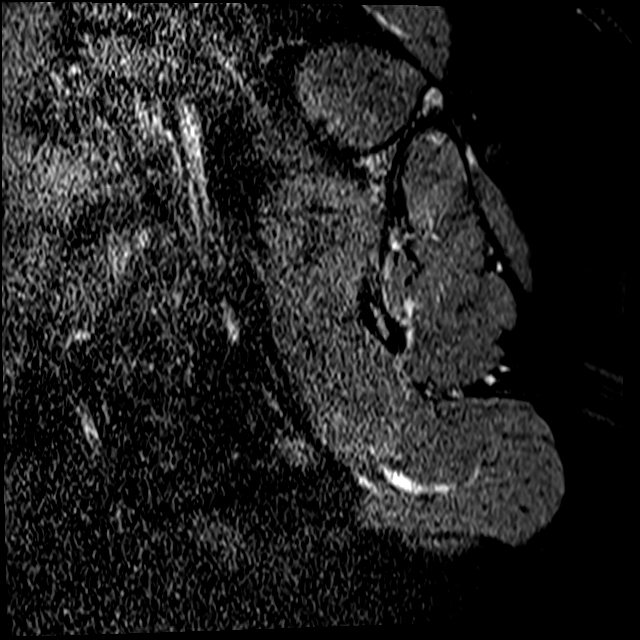
[im 8/22]
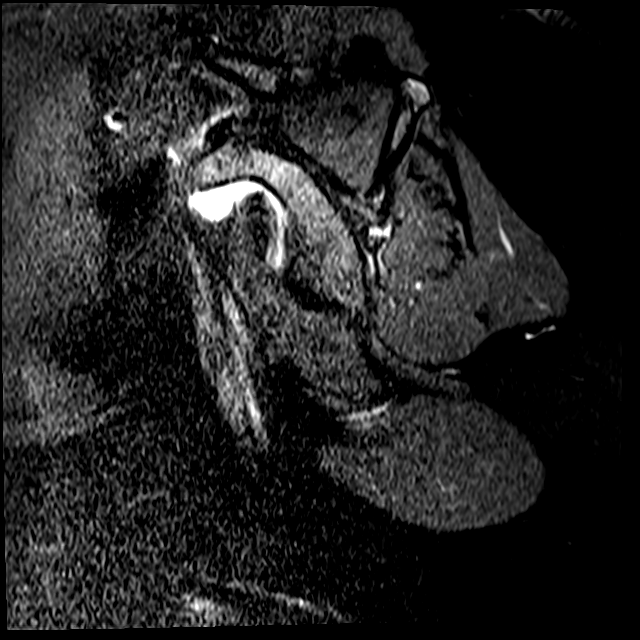
[im 11/22]
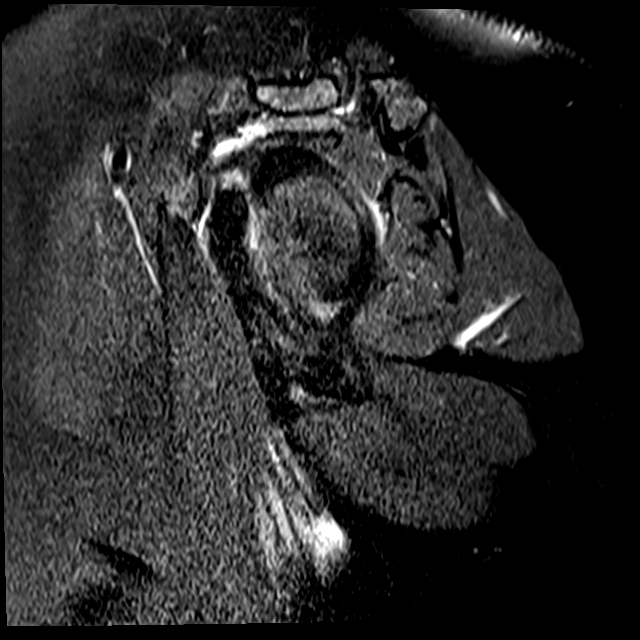
[im 18/22]
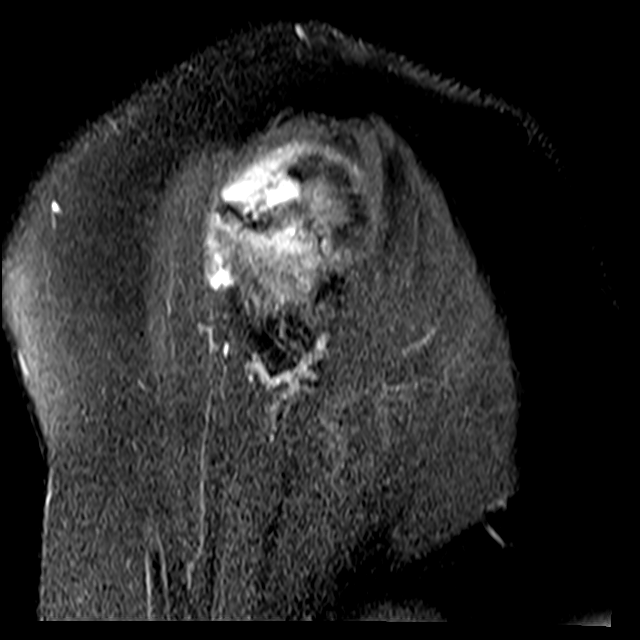

[31 of 40 positions shown; findings below may reference images not displayed]

FINDINGS: Rotator cuff: Severe tendinosis of the supraspinatus, infraspinatus,
and subscapularis tendons. Large near full-thickness articular
surface tear of the supraspinatus tendon insertion where there is up
to 8 mm of tendon retraction. Tear measures approximately 15 mm in
AP dimension and involves at least 75% of the tendon depth (series
5, images 13-15). Teres minor unremarkable.

Muscles: Preserved bulk and signal intensity of the rotator cuff
musculature without edema, atrophy, or fatty infiltration.

Biceps long head:  Moderate intra-articular biceps tendinosis.

Acromioclavicular Joint: Mild arthropathy of the AC joint. Small
volume subacromial-subdeltoid bursal fluid.

Glenohumeral Joint: No joint effusion. No chondral defect.

Labrum: Grossly intact, but evaluation is limited by lack of
intraarticular fluid.

Bones:  No marrow abnormality, fracture or dislocation.

Other: None.
IMPRESSION: 1. Severe rotator cuff tendinosis with a large near full-thickness
articular surface tear of the supraspinatus tendon with mild
retraction.
2. Moderate intra-articular biceps tendinosis.
3. Mild subacromial-subdeltoid bursitis.

## 2021-07-13 ENCOUNTER — Ambulatory Visit: Payer: PRIVATE HEALTH INSURANCE | Admitting: Physical Therapy

## 2021-07-13 ENCOUNTER — Other Ambulatory Visit: Payer: Self-pay

## 2021-07-13 DIAGNOSIS — M25512 Pain in left shoulder: Secondary | ICD-10-CM

## 2021-07-13 DIAGNOSIS — M6281 Muscle weakness (generalized): Secondary | ICD-10-CM

## 2021-07-13 DIAGNOSIS — M25612 Stiffness of left shoulder, not elsewhere classified: Secondary | ICD-10-CM

## 2021-07-13 NOTE — Therapy (Signed)
[place] MAIN [facility] SERVICES [address] [place], [place], [address] Phone: [phone]   Fax:  [phone]  Physical Therapy Treatment  Patient Details  Name: Carolyn Sampson MRN: [id number] Date of Birth: November 15, 1960 Referring Provider (PT): Mickle Asper [facility]   Encounter Date: 07/13/2021   PT End of Session - 07/13/21 0910     Visit Number 11    Number of Visits 12    Date for PT Re-Evaluation 09/10/21    Authorization Type WC    Authorization - Visit Number 11    Authorization - Number of Visits 12    Progress Note Due on Visit 10    PT Start Time 0832    PT Stop Time 0915    PT Time Calculation (min) 43 min    Equipment Utilized During Treatment Gait belt    Activity Tolerance Patient limited by pain;Patient tolerated treatment well    Behavior During Therapy [facility] for tasks assessed/performed             Past Medical History:  Diagnosis Date   Fatty liver    Pre-diabetes     Past Surgical History:  Procedure Laterality Date   ABDOMINAL HYSTERECTOMY     APPENDECTOMY     CESAREAN SECTION     CESAREAN SECTION     COLONOSCOPY WITH PROPOFOL N/A 06/02/2021   Procedure: COLONOSCOPY WITH PROPOFOL;  Surgeon: Lin Landsman, MD;  Location: [facility] ENDOSCOPY;  Service: Gastroenterology;  Laterality: N/A;  SPANISH INTERPRETER   LAPAROSCOPIC HYSTERECTOMY     LAPAROSCOPIC OOPHERECTOMY Right     There were no vitals filed for this visit.   Subjective Assessment - 07/13/21 0834     Subjective Pt reports imaging went welll and she is going to MD for interpretation of imaging tomorrow.    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 Is now performing normal duty but is having to modify activities and is not able to perform lifting activities such as taking the trash out.    Patient Stated Goals To be able to perform work duties on the same routine as she had before.     Currently in Pain? Yes    Pain Score 6     Pain Location Shoulder    Pain Orientation Left;Lateral    Pain Descriptors / Indicators Sore    Pain Type Chronic pain    Pain Onset More than a month ago    Pain Frequency Constant                Treatment provided this session is listed and explained below.  Treatment was limited from today's session due to patient's continued reported increase in shoulder pain with any activities of the shoulder.  Patient even reports pain with resting shoulder on elevated surfaces such as tables.           Manual Therapy:   Passive range of motion in all directions with gentle mobilizations performed throughout in order to improve patient's pain levels as well as to improve muscle guarding.  X23 minutes -Significant muscle guarding due to pain with abduction and flexion range of motion.  Despite cues for further relaxation patient unable to relax due to intensity of pain levels in her shoulder.  Able to elevate to approximately 50 degrees with both abduction and flexion with open infield due to increase in pain. -External rotation and internal rotation at 30 degrees of  abduction with shoulder resting on towel roll.  No increase in pain able to obtain approximately 45 degrees external rotation and full internal rotation at this point in shoulder abduction.   Therex:   Ball roll outs 10 x 10 sec ea (3 way)  -anterior, anterolateral and anteromedial roll outs  -pt reported anteromedial ball roll outs were the most difficult -cues to only perform in pain free range of motion  -pt reported slight improvement in pain and rom following    Ice pack was provided in order to improve patient pain and inflammation post session x 10 min - no Charge   Self care : Pt educated regarding on potential questions to ask MD at appointment on Friday. Pt was provided with several translated questions to mention to MD regarding her care as she reports she never knows  what questions are appropriate to ask.                   PT Education - 07/13/21 0909     Education Details Pt educated regarding questions to ask doctor at appointment tomorrow    Person(s) Educated Patient    Methods Explanation    Comprehension Verbalized understanding              PT Short Term Goals - 07/11/21 0914       PT SHORT TERM GOAL #1   Title Patient will be independent in home exercise program to improve strength/mobility for better functional independence with ADLs.    Baseline Pt does not have HEP, pt was completing HEP prior to recent shoulder exacerbation during work activity    Time 3    Period Weeks    Status Partially Met    Target Date 08/01/21      PT SHORT TERM GOAL #2   Title Patient will increase FOTO score to equal to or greater than   53  to demonstrate statistically significant improvement in mobility and quality of life.    Baseline 47 FOTO, 41 at MN    Time 4    Period Weeks    Status Not Met    Target Date 08/08/21      PT SHORT TERM GOAL #3   Title Pt will improve shoulder flexion and abduction AROM by 15 degrees each indicationg progress with funcitonal shoulder elevation    Baseline 80ABD, 105 Flexion at IE, 50 abd and 50 flex at PN 10/10    Time 4    Period Weeks    Status New    Target Date 08/08/21               PT Long Term Goals - 07/11/21 0916       PT LONG TERM GOAL #1   Title Patient will increase FOTO score to equal to or greater than  62   to demonstrate statistically significant improvement in mobility and quality of life.    Baseline 47, 41 @ PN    Time 8    Period Weeks    Status Not Met    Target Date 09/05/21      PT LONG TERM GOAL #2   Title Pt will improve L shoulder strength to 4-/5 or greater with abd, flex and ER within her available ROM in order to indicate improved shoulder funciton    Baseline see flowsheet, not met, unable to actively elevate shoulder without pain    Time 8     Period Weeks    Status  New    Target Date 09/05/21      PT LONG TERM GOAL #3   Title Pt will report ability to complete all work related activities without c/o pain in either of her shoulders in order to indicate improved funciton and achiavement of pt stated goal.    Baseline Pt has to modify her work and has pain with many activities at work, not met, pt recently taken out of work due ot shoulder exacerbation    Time 8    Period Weeks    Status New    Target Date 09/05/21                   Plan - 07/13/21 0910     Clinical Impression Statement Pt continues to demonstrate increased pain with passive shoulder range of motion. Pt displayed improved ability to perform AAROm with ball roll outs in today's session. Pt is going back to MD regarding MRI imaging results on friday. Pt will continue to benefit from skille physical therapy services in order to improve her shoulder pain and discomfort and improve her range of motion.    Personal Factors and Comorbidities Age;Comorbidity 1    Comorbidities pre-diabetes    Examination-Activity Limitations Carry;Lift;Reach Overhead    Examination-Participation Restrictions Occupation;Driving    Stability/Clinical Decision Making Stable/Uncomplicated    Rehab Potential Good    PT Frequency 2x / week    PT Duration 6 weeks    PT Treatment/Interventions ADLs/Self Care Home Management;Aquatic Therapy;Electrical Stimulation;Moist Heat;Ultrasound;DME Instruction;Functional mobility training;Therapeutic activities;Therapeutic exercise;Neuromuscular re-education;Patient/family education;Manual techniques;Passive range of motion;Energy conservation;Taping;Vasopneumatic Device;Joint Manipulations    PT Next Visit Plan Progress HEP as indicated; Continue with Manual Therapy and progressive Therex as appropriate for ROM and improved functional use.    PT Home Exercise Plan Provided 05/30/21; no updates today.    Consulted and Agree with Plan of Care  Patient             Patient will benefit from skilled therapeutic intervention in order to improve the following deficits and impairments:  Decreased range of motion, Improper body mechanics, Decreased activity tolerance, Decreased strength, Impaired UE functional use  Visit Diagnosis: Muscle weakness (generalized)  Acute pain of left shoulder  Stiffness of left shoulder, not elsewhere classified     Problem List Patient Active Problem List   Diagnosis Date Noted   Screen for colon cancer    Muscle cramps 05/28/2018   Pre-diabetes 05/28/2018   Fatty liver 05/28/2018   Hair loss 05/28/2018   Sciatica 11/01/2015   S/P right oophorectomy 11/01/2015   S/P hysterectomy 11/01/2015    Particia Lather, PT 07/13/2021, 9:15 AM  [facility] [address] [place], [place], [address] Phone: [phone]   Fax:  [phone]  Name: Carolyn Sampson MRN: [id number] Date of Birth: [birth year]

## 2021-07-18 ENCOUNTER — Ambulatory Visit: Payer: PRIVATE HEALTH INSURANCE | Admitting: Physical Therapy

## 2021-07-20 ENCOUNTER — Ambulatory Visit: Payer: PRIVATE HEALTH INSURANCE | Admitting: Physical Therapy

## 2021-07-25 ENCOUNTER — Other Ambulatory Visit: Payer: Self-pay | Admitting: Orthopedic Surgery

## 2021-07-25 ENCOUNTER — Ambulatory Visit: Payer: 59 | Admitting: Physical Therapy

## 2021-07-27 ENCOUNTER — Ambulatory Visit: Payer: 59 | Admitting: Physical Therapy

## 2021-08-01 ENCOUNTER — Ambulatory Visit: Payer: 59 | Admitting: Physical Therapy

## 2021-08-03 ENCOUNTER — Ambulatory Visit: Payer: 59 | Admitting: Physical Therapy

## 2021-08-05 ENCOUNTER — Ambulatory Visit: Payer: 59 | Admitting: Physical Therapy

## 2021-08-16 ENCOUNTER — Encounter (HOSPITAL_BASED_OUTPATIENT_CLINIC_OR_DEPARTMENT_OTHER): Payer: Self-pay | Admitting: Orthopedic Surgery

## 2021-08-16 ENCOUNTER — Other Ambulatory Visit: Payer: Self-pay

## 2021-08-22 ENCOUNTER — Ambulatory Visit (HOSPITAL_BASED_OUTPATIENT_CLINIC_OR_DEPARTMENT_OTHER): Payer: PRIVATE HEALTH INSURANCE | Admitting: Anesthesiology

## 2021-08-22 ENCOUNTER — Other Ambulatory Visit: Payer: Self-pay

## 2021-08-22 ENCOUNTER — Ambulatory Visit (HOSPITAL_BASED_OUTPATIENT_CLINIC_OR_DEPARTMENT_OTHER): Admit: 2021-08-22 | Payer: Self-pay | Admitting: Orthopedic Surgery

## 2021-08-22 ENCOUNTER — Encounter (HOSPITAL_BASED_OUTPATIENT_CLINIC_OR_DEPARTMENT_OTHER): Payer: Self-pay

## 2021-08-22 ENCOUNTER — Ambulatory Visit (HOSPITAL_BASED_OUTPATIENT_CLINIC_OR_DEPARTMENT_OTHER)
Admission: RE | Admit: 2021-08-22 | Discharge: 2021-08-22 | Disposition: A | Discharge status: Home / Self Care | Payer: PRIVATE HEALTH INSURANCE | Attending: Orthopedic Surgery | Admitting: Orthopedic Surgery

## 2021-08-22 ENCOUNTER — Encounter (HOSPITAL_BASED_OUTPATIENT_CLINIC_OR_DEPARTMENT_OTHER)
Admission: RE | Disposition: A | Discharge status: Home / Self Care | Payer: Self-pay | Source: Home / Self Care | Attending: Orthopedic Surgery

## 2021-08-22 DIAGNOSIS — M75122 Complete rotator cuff tear or rupture of left shoulder, not specified as traumatic: Secondary | ICD-10-CM | POA: Insufficient documentation

## 2021-08-22 DIAGNOSIS — X58XXXA Exposure to other specified factors, initial encounter: Secondary | ICD-10-CM | POA: Insufficient documentation

## 2021-08-22 HISTORY — PX: SHOULDER ARTHROSCOPY WITH ROTATOR CUFF REPAIR AND SUBACROMIAL DECOMPRESSION: SHX5686

## 2021-08-22 SURGERY — SHOULDER ARTHROSCOPY WITH ROTATOR CUFF REPAIR AND SUBACROMIAL DECOMPRESSION
Anesthesia: Choice | Laterality: Left

## 2021-08-22 SURGERY — SHOULDER ARTHROSCOPY WITH ROTATOR CUFF REPAIR AND SUBACROMIAL DECOMPRESSION
Anesthesia: General | Site: Shoulder | Laterality: Left

## 2021-08-22 MED ORDER — LACTATED RINGERS IV SOLN: INTRAVENOUS | Status: DC | PRN

## 2021-08-22 MED ORDER — PROPOFOL 10 MG/ML IV BOLUS
INTRAVENOUS | Status: DC | PRN
  Administered 2021-08-22: 200 mg via INTRAVENOUS

## 2021-08-22 MED ORDER — BUPIVACAINE-EPINEPHRINE (PF) 0.5% -1:200000 IJ SOLN
INTRAMUSCULAR | Status: DC | PRN
  Administered 2021-08-22 (×5): 2 mL via PERINEURAL

## 2021-08-22 MED ORDER — ACETAMINOPHEN 160 MG/5ML PO SOLN: 325.0000 mg | ORAL | Status: DC | PRN

## 2021-08-22 MED ORDER — ONDANSETRON HCL 4 MG/2ML IJ SOLN: 4.0000 mg | Freq: Once | INTRAMUSCULAR | Status: DC | PRN

## 2021-08-22 MED ORDER — LIDOCAINE 2% (20 MG/ML) 5 ML SYRINGE
INTRAMUSCULAR | Status: AC
  Filled 2021-08-22: qty 5

## 2021-08-22 MED ORDER — ONDANSETRON HCL 4 MG/2ML IJ SOLN
INTRAMUSCULAR | Status: DC | PRN
  Administered 2021-08-22: 4 mg via INTRAVENOUS

## 2021-08-22 MED ORDER — KETOROLAC TROMETHAMINE 30 MG/ML IJ SOLN: 30.0000 mg | Freq: Once | INTRAMUSCULAR | Status: DC | PRN

## 2021-08-22 MED ORDER — FENTANYL CITRATE (PF) 100 MCG/2ML IJ SOLN
INTRAMUSCULAR | Status: AC
  Filled 2021-08-22: qty 2

## 2021-08-22 MED ORDER — MIDAZOLAM HCL 2 MG/2ML IJ SOLN
INTRAMUSCULAR | Status: AC
  Filled 2021-08-22: qty 2

## 2021-08-22 MED ORDER — FENTANYL CITRATE (PF) 100 MCG/2ML IJ SOLN
100.0000 ug | Freq: Once | INTRAMUSCULAR | Status: AC
  Administered 2021-08-22: 100 ug via INTRAVENOUS

## 2021-08-22 MED ORDER — LACTATED RINGERS IV SOLN: INTRAVENOUS | Status: DC

## 2021-08-22 MED ORDER — DEXAMETHASONE SODIUM PHOSPHATE 10 MG/ML IJ SOLN
INTRAMUSCULAR | Status: AC
  Filled 2021-08-22: qty 1

## 2021-08-22 MED ORDER — CEFAZOLIN SODIUM-DEXTROSE 2-4 GM/100ML-% IV SOLN: 2.0000 g | INTRAVENOUS | Status: DC

## 2021-08-22 MED ORDER — OXYCODONE-ACETAMINOPHEN 5-325 MG PO TABS: 1.0000 | ORAL_TABLET | Freq: Four times a day (QID) | ORAL | 0 refills | Status: AC | PRN

## 2021-08-22 MED ORDER — BUPIVACAINE HCL (PF) 0.5 % IJ SOLN
INTRAMUSCULAR | Status: AC
  Filled 2021-08-22: qty 30

## 2021-08-22 MED ORDER — FENTANYL CITRATE (PF) 100 MCG/2ML IJ SOLN
INTRAMUSCULAR | Status: DC | PRN
  Administered 2021-08-22: 50 ug via INTRAVENOUS
  Administered 2021-08-22 (×2): 25 ug via INTRAVENOUS

## 2021-08-22 MED ORDER — DEXAMETHASONE SODIUM PHOSPHATE 10 MG/ML IJ SOLN
INTRAMUSCULAR | Status: DC | PRN
  Administered 2021-08-22: 5 mg via INTRAVENOUS

## 2021-08-22 MED ORDER — CEFAZOLIN SODIUM-DEXTROSE 2-4 GM/100ML-% IV SOLN
INTRAVENOUS | Status: AC
  Filled 2021-08-22: qty 100

## 2021-08-22 MED ORDER — PROPOFOL 10 MG/ML IV BOLUS
INTRAVENOUS | Status: AC
  Filled 2021-08-22: qty 40

## 2021-08-22 MED ORDER — MIDAZOLAM HCL 2 MG/2ML IJ SOLN
2.0000 mg | Freq: Once | INTRAMUSCULAR | Status: AC
  Administered 2021-08-22: 2 mg via INTRAVENOUS

## 2021-08-22 MED ORDER — OXYCODONE HCL 5 MG/5ML PO SOLN: 5.0000 mg | Freq: Once | ORAL | Status: DC | PRN

## 2021-08-22 MED ORDER — EPHEDRINE SULFATE 50 MG/ML IJ SOLN
INTRAMUSCULAR | Status: DC | PRN
  Administered 2021-08-22 (×2): 10 mg via INTRAVENOUS

## 2021-08-22 MED ORDER — TIZANIDINE HCL 2 MG PO TABS: 2.0000 mg | ORAL_TABLET | Freq: Three times a day (TID) | ORAL | 0 refills | Status: AC | PRN

## 2021-08-22 MED ORDER — ONDANSETRON HCL 4 MG/2ML IJ SOLN
INTRAMUSCULAR | Status: AC
  Filled 2021-08-22: qty 2

## 2021-08-22 MED ORDER — PHENYLEPHRINE HCL (PRESSORS) 10 MG/ML IV SOLN
INTRAVENOUS | Status: DC | PRN
  Administered 2021-08-22: 120 ug via INTRAVENOUS
  Administered 2021-08-22 (×2): 80 ug via INTRAVENOUS
  Administered 2021-08-22: 120 ug via INTRAVENOUS

## 2021-08-22 MED ORDER — SODIUM CHLORIDE 0.9 % IR SOLN
Status: DC | PRN
  Administered 2021-08-22: 5000 mL

## 2021-08-22 MED ORDER — BUPIVACAINE LIPOSOME 1.3 % IJ SUSP
INTRAMUSCULAR | Status: DC | PRN
  Administered 2021-08-22 (×5): 2 mL via PERINEURAL

## 2021-08-22 MED ORDER — CEFAZOLIN SODIUM-DEXTROSE 2-3 GM-%(50ML) IV SOLR
INTRAVENOUS | Status: DC | PRN
  Administered 2021-08-22: 2 g via INTRAVENOUS

## 2021-08-22 MED ORDER — MEPERIDINE HCL 25 MG/ML IJ SOLN
6.2500 mg | INTRAMUSCULAR | Status: DC | PRN
Start: 2021-08-22 — End: 2021-08-22

## 2021-08-22 MED ORDER — ROCURONIUM BROMIDE 10 MG/ML (PF) SYRINGE
PREFILLED_SYRINGE | INTRAVENOUS | Status: AC
  Filled 2021-08-22: qty 10

## 2021-08-22 MED ORDER — ONDANSETRON HCL 4 MG PO TABS: 4.0000 mg | ORAL_TABLET | Freq: Three times a day (TID) | ORAL | 0 refills | Status: AC | PRN

## 2021-08-22 MED ORDER — MIDAZOLAM HCL 2 MG/2ML IJ SOLN
INTRAMUSCULAR | Status: DC | PRN
  Administered 2021-08-22: 2 mg via INTRAVENOUS

## 2021-08-22 MED ORDER — OXYCODONE HCL 5 MG PO TABS: 5.0000 mg | ORAL_TABLET | Freq: Once | ORAL | Status: DC | PRN

## 2021-08-22 MED ORDER — FENTANYL CITRATE (PF) 100 MCG/2ML IJ SOLN: 25.0000 ug | INTRAMUSCULAR | Status: DC | PRN

## 2021-08-22 MED ORDER — ACETAMINOPHEN 325 MG PO TABS: 325.0000 mg | ORAL_TABLET | ORAL | Status: DC | PRN

## 2021-08-22 SURGICAL SUPPLY — 54 items
AID PSTN UNV HD RSTRNT DISP (MISCELLANEOUS) ×1
ANCH SUT SWLK 19.1X4.75 VT (Anchor) ×2 IMPLANT
ANCHOR PEEK 4.75X19.1 SWLK C (Anchor) ×4 IMPLANT
APL PRP STRL LF DISP 70% ISPRP (MISCELLANEOUS) ×1
APL SKNCLS STERI-STRIP NONHPOA (GAUZE/BANDAGES/DRESSINGS)
BENZOIN TINCTURE PRP APPL 2/3 (GAUZE/BANDAGES/DRESSINGS) IMPLANT
BURR OVAL 8 FLU 4.0X13 (MISCELLANEOUS) ×2 IMPLANT
CANNULA 5.75X7 CRYSTAL CLEAR (CANNULA) ×2 IMPLANT
CANNULA TWIST IN 8.25X7CM (CANNULA) ×2 IMPLANT
CHLORAPREP W/TINT 26 (MISCELLANEOUS) ×2 IMPLANT
CUTTER BONE 4.0MM X 13CM (MISCELLANEOUS) ×2 IMPLANT
DRAPE IMP U-DRAPE 54X76 (DRAPES) ×2 IMPLANT
DRAPE INCISE IOBAN 66X45 STRL (DRAPES) ×2 IMPLANT
DRAPE STERI 35X30 U-POUCH (DRAPES) ×2 IMPLANT
DRAPE SURG 17X23 STRL (DRAPES) ×2 IMPLANT
DRAPE U-SHAPE 47X51 STRL (DRAPES) ×2 IMPLANT
DRAPE U-SHAPE 76X120 STRL (DRAPES) ×4 IMPLANT
DRSG PAD ABDOMINAL 8X10 ST (GAUZE/BANDAGES/DRESSINGS) ×2 IMPLANT
ELECT REM PT RETURN 9FT ADLT (ELECTROSURGICAL) ×2
ELECTRODE REM PT RTRN 9FT ADLT (ELECTROSURGICAL) ×1 IMPLANT
GAUZE SPONGE 4X4 12PLY STRL (GAUZE/BANDAGES/DRESSINGS) ×2 IMPLANT
GAUZE XEROFORM 1X8 LF (GAUZE/BANDAGES/DRESSINGS) ×2 IMPLANT
GLOVE SRG 8 PF TXTR STRL LF DI (GLOVE) ×1 IMPLANT
GLOVE SURG ENC MOIS LTX SZ7.5 (GLOVE) ×2 IMPLANT
GLOVE SURG POLYISO LF SZ6.5 (GLOVE) ×2 IMPLANT
GLOVE SURG UNDER POLY LF SZ6.5 (GLOVE) ×2 IMPLANT
GLOVE SURG UNDER POLY LF SZ7 (GLOVE) ×4 IMPLANT
GLOVE SURG UNDER POLY LF SZ8 (GLOVE) ×2
GOWN STRL REUS W/ TWL LRG LVL3 (GOWN DISPOSABLE) ×2 IMPLANT
GOWN STRL REUS W/ TWL XL LVL3 (GOWN DISPOSABLE) ×1 IMPLANT
GOWN STRL REUS W/TWL LRG LVL3 (GOWN DISPOSABLE) ×4
GOWN STRL REUS W/TWL XL LVL3 (GOWN DISPOSABLE) ×2
LASSO CRESCENT QUICKPASS (SUTURE) IMPLANT
MANIFOLD NEPTUNE II (INSTRUMENTS) ×2 IMPLANT
NEEDLE SCORPION MULTI FIRE (NEEDLE) ×2 IMPLANT
PACK ARTHROSCOPY DSU (CUSTOM PROCEDURE TRAY) ×2 IMPLANT
PACK BASIN DAY SURGERY FS (CUSTOM PROCEDURE TRAY) ×2 IMPLANT
PROBE BIPOLAR ATHRO 135MM 90D (MISCELLANEOUS) ×2 IMPLANT
RESTRAINT HEAD UNIVERSAL NS (MISCELLANEOUS) ×2 IMPLANT
SLEEVE SCD COMPRESS KNEE MED (STOCKING) ×2 IMPLANT
SLING ARM FOAM STRAP LRG (SOFTGOODS) ×2 IMPLANT
STRIP CLOSURE SKIN 1/2X4 (GAUZE/BANDAGES/DRESSINGS) IMPLANT
SUPPORT WRAP ARM LG (MISCELLANEOUS) ×2 IMPLANT
SUT ETHILON 3 0 PS 1 (SUTURE) ×2 IMPLANT
SUT FIBERWIRE #2 38 T-5 BLUE (SUTURE)
SUT PDS AB 0 CT 36 (SUTURE) IMPLANT
SUT TIGER TAPE 7 IN WHITE (SUTURE) IMPLANT
SUTURE FIBERWR #2 38 T-5 BLUE (SUTURE) IMPLANT
SUTURE TAPE 1.3 40 TPR END (SUTURE) ×2 IMPLANT
SUTURETAPE 1.3 40 TPR END (SUTURE) ×4
TAPE FIBER 2MM 7IN #2 BLUE (SUTURE) IMPLANT
TOWEL GREEN STERILE FF (TOWEL DISPOSABLE) ×2 IMPLANT
TUBE CONNECTING 20X1/4 (TUBING) ×2 IMPLANT
TUBING ARTHROSCOPY IRRIG 16FT (MISCELLANEOUS) ×2 IMPLANT

## 2021-08-22 NOTE — Anesthesia Procedure Notes (Addendum)
Anesthesia Regional Block: Interscalene brachial plexus block   Pre-Anesthetic Checklist: , timeout performed,  Correct Patient, Correct Site, Correct Laterality,  Correct Procedure, Correct Position, site marked,  Risks and benefits discussed,  Surgical consent,  Pre-op evaluation,  At surgeon's request and post-op pain management  Laterality: Left and Upper  Prep: chloraprep       Needles:  Injection technique: Single-shot  Needle Type: Echogenic Stimulator Needle     Needle Length: 9cm  Needle Gauge: 20   Needle insertion depth: 1.5 cm   Additional Needles:   Procedures:,,,, ultrasound used (permanent image in chart),,    Narrative:  Start time: 08/22/2021 12:00 PM End time: 08/22/2021 12:10 PM Injection made incrementally with aspirations every 5 mL.  Performed by: Personally  Anesthesiologist: Lyn Hollingshead, MD

## 2021-08-22 NOTE — Anesthesia Procedure Notes (Signed)
Procedure Name: LMA Insertion Date/Time: 08/22/2021 1:06 PM Performed by: Verita Lamb, CRNA Pre-anesthesia Checklist: Patient identified, Emergency Drugs available, Suction available and Patient being monitored Patient Re-evaluated:Patient Re-evaluated prior to induction Oxygen Delivery Method: Circle system utilized Preoxygenation: Pre-oxygenation with 100% oxygen Induction Type: IV induction Ventilation: Mask ventilation without difficulty LMA: LMA inserted LMA Size: 4.0 Number of attempts: 1 Airway Equipment and Method: Bite block Placement Confirmation: positive ETCO2, CO2 detector and breath sounds checked- equal and bilateral Tube secured with: Tape Dental Injury: Teeth and Oropharynx as per pre-operative assessment

## 2021-08-22 NOTE — Anesthesia Preprocedure Evaluation (Signed)
Anesthesia Evaluation  Patient identified by MRN, date of birth, ID band Patient awake    Reviewed: Allergy & Precautions, NPO status , Patient's Chart, lab work & pertinent test results  Airway Mallampati: II       Dental no notable dental hx.    Pulmonary neg pulmonary ROS,    Pulmonary exam normal        Cardiovascular negative cardio ROS Normal cardiovascular exam     Neuro/Psych negative psych ROS   GI/Hepatic negative GI ROS, Neg liver ROS,   Endo/Other    Renal/GU negative Renal ROS  negative genitourinary   Musculoskeletal   Abdominal (+) + obese,   Peds  Hematology   Anesthesia Other Findings   Reproductive/Obstetrics                             Anesthesia Physical Anesthesia Plan  ASA: 2  Anesthesia Plan: General   Post-op Pain Management: Regional block   Induction: Intravenous  PONV Risk Score and Plan: 3 and Ondansetron, Dexamethasone and Midazolam  Airway Management Planned: LMA and Oral ETT  Additional Equipment: None  Intra-op Plan:   Post-operative Plan: Extubation in OR  Informed Consent: I have reviewed the patients History and Physical, chart, labs and discussed the procedure including the risks, benefits and alternatives for the proposed anesthesia with the patient or authorized representative who has indicated his/her understanding and acceptance.     Dental advisory given  Plan Discussed with: CRNA  Anesthesia Plan Comments:         Anesthesia Quick Evaluation

## 2021-08-22 NOTE — Transfer of Care (Signed)
Immediate Anesthesia Transfer of Care Note  Patient: Carolyn Sampson  Procedure(s) Performed: SHOULDER ARTHROSCOPY WITH ROTATOR CUFF REPAIR AND SUBACROMIAL DECOMPRESSION (Left: Shoulder)  Patient Location: PACU  Anesthesia Type:General and Regional  Level of Consciousness: awake, alert  and oriented  Airway & Oxygen Therapy: Patient Spontanous Breathing and Patient connected to face mask oxygen  Post-op Assessment: Report given to RN and Post -op Vital signs reviewed and stable  Post vital signs: Reviewed and stable  Last Vitals:  Vitals Value Taken Time  BP    Temp    Pulse 90 08/22/21 1406  Resp    SpO2 100 % 08/22/21 1406  Vitals shown include unvalidated device data.  Last Pain:  Vitals:   08/22/21 1102  TempSrc: Oral  PainSc: 0-No pain      Patients Stated Pain Goal: 3 (29/24/46 2863)  Complications: No notable events documented.

## 2021-08-22 NOTE — H&P (Signed)
Carolyn Sampson is an 60 y.o. female.   Chief Complaint: L shoulder pain and dysfunction HPI: s/p injury at work with L shoulder rotator cuff tear, failed conservative tx.  Past Medical History:  Diagnosis Date   Fatty liver    Pre-diabetes     Past Surgical History:  Procedure Laterality Date   ABDOMINAL HYSTERECTOMY     APPENDECTOMY     CESAREAN SECTION     CESAREAN SECTION     COLONOSCOPY WITH PROPOFOL N/A 06/02/2021   Procedure: COLONOSCOPY WITH PROPOFOL;  Surgeon: Lin Landsman, MD;  Location: [facility] ENDOSCOPY;  Service: Gastroenterology;  Laterality: N/A;  SPANISH INTERPRETER   LAPAROSCOPIC HYSTERECTOMY     LAPAROSCOPIC OOPHERECTOMY Right     History reviewed. No pertinent family history. Social History:  reports that she has never smoked. She has never used smokeless tobacco. She reports that she does not drink alcohol and does not use drugs.  Allergies:  Allergies  Allergen Reactions   Tramadol Nausea And Vomiting and Other (See Comments)    Dizziness. Dizziness. Dizziness. Dizziness.     No medications prior to admission.    No results found for this or any previous visit (from the past 48 hour(s)). No results found.  Review of Systems  All other systems reviewed and are negative.  Blood pressure (!) 116/56, pulse 74, temperature 98.3 F (36.8 C), temperature source Oral, resp. rate 10, height 5' (1.524 m), weight 74.6 kg, last menstrual period 10/16/2003, SpO2 99 %. Physical Exam HENT:     Head: Atraumatic.  Eyes:     Extraocular Movements: Extraocular movements intact.  Cardiovascular:     Pulses: Normal pulses.  Pulmonary:     Effort: Pulmonary effort is normal.  Musculoskeletal:     Comments: L shoulder pain with RC testing.  Neurological:     Mental Status: She is alert.     Assessment/Plan s/p injury at work with L shoulder rotator cuff tear, failed conservative tx. Plan L arth RCR, SAD Risks / benefits of surgery discussed Consent  on chart  NPO for OR Preop antibiotics   Rhae Hammock, MD 08/22/2021, 12:27 PM

## 2021-08-22 NOTE — Anesthesia Postprocedure Evaluation (Signed)
Anesthesia Post Note  Patient: Carolyn Sampson  Procedure(s) Performed: SHOULDER ARTHROSCOPY WITH ROTATOR CUFF REPAIR AND SUBACROMIAL DECOMPRESSION (Left: Shoulder)     Patient location during evaluation: PACU Anesthesia Type: General Level of consciousness: awake and sedated Pain management: pain level controlled Vital Signs Assessment: post-procedure vital signs reviewed and stable Respiratory status: spontaneous breathing Cardiovascular status: stable Postop Assessment: no apparent nausea or vomiting Anesthetic complications: no   No notable events documented.  Last Vitals:  Vitals:   08/22/21 1415 08/22/21 1430  BP: (!) 107/56 108/64  Pulse: 86 88  Resp: 14 18  Temp:    SpO2: 100% 97%    Last Pain:  Vitals:   08/22/21 1415  TempSrc:   PainSc: 0-No pain                 Huston Foley

## 2021-08-22 NOTE — Op Note (Signed)
Procedure(s): SHOULDER ARTHROSCOPY WITH ROTATOR CUFF REPAIR AND SUBACROMIAL DECOMPRESSION Procedure Note  Carolyn Sampson female 60 y.o. 08/22/2021   Preoperative diagnosis: Left shoulder full-thickness rotator cuff tear with unfavorable acromial anatomy  Postoperative diagnosis: Same  Procedure(s) and Anesthesia Type:    * SHOULDER ARTHROSCOPY WITH ROTATOR CUFF REPAIR AND SUBACROMIAL DECOMPRESSION - General  Surgeon(s) and Role:    Tania Ade, MD - Primary     Surgeon: Rhae Hammock   Assistants: Sheryle Hail PA-C [name] was present and scrubbed throughout the procedure and was essential in positioning, assisting with the camera and instrumentation,, and closure)  Anesthesia: General endotracheal anesthesia with preoperative interscalene block given by the attending anesthesiologist    Procedure Detail  SHOULDER ARTHROSCOPY WITH ROTATOR CUFF REPAIR AND SUBACROMIAL DECOMPRESSION  Estimated Blood Loss: Min         Drains: none  Blood Given: none         Specimens: none        Complications:  * No complications entered in OR log *         Disposition: PACU - hemodynamically stable.         Condition: stable    Procedure:   INDICATIONS FOR SURGERY: The patient is 60 y.o. female who was injured at work with right shoulder rotator cuff tear, failed conservative management.  Indicated for surgery to try and decrease pain and restore function.  OPERATIVE FINDINGS: Examination under anesthesia: No stiffness or instability.   DESCRIPTION OF PROCEDURE: The patient was identified in preoperative  holding area where I personally marked the operative site after  verifying site, side, and procedure with the patient. An interscalene block was given by the attending anesthesiologist the holding area.  The patient was taken back to the operating room where general anesthesia was induced without complication and was placed in the beach-chair position with the  back  elevated about 60 degrees and all extremities and head and neck carefully padded and  positioned.   The left upper extremity was then prepped and  draped in a standard sterile fashion. The appropriate time-out  procedure was carried out. The patient did receive IV antibiotics  within 30 minutes of incision.   A small posterior portal incision was made and the arthroscope was introduced into the joint. An anterior portal was then established above the subscapularis using needle localization. Small cannula was placed anteriorly. Diagnostic arthroscopy was then carried out .  She was noted to have some tearing of the superior labrum which was debrided back to healthy labrum.  The biceps anchor was felt to be intact.  The biceps tendon was pulled into the joint and did not have any significant tearing.  Glenohumeral joint surfaces were intact without significant chondromalacia.  The subscapularis was intact.  The undersurface of the supraspinatus had significant tearing and was debrided back.  There was an area centrally with full-thickness penetration.  There was not significant retraction.  The arthroscope was then introduced into the subacromial space a standard lateral portal was established with needle localization. The shaver was used through the lateral portal to perform extensive bursectomy. 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  The bursal side of the rotator cuff tear was identified.  There was only a small area of full-thickness penetration.  This was debrided back to healthy tendon.  The tuberosity was then debrided down to the bony bleeding surface to promote healing.  The area was then repaired  using a total of 2 4.75 peek swivel lock anchors with 1 placed centrally just off the articular margin preloaded with 2 suture tapes.  These 4 strands were passed evenly throughout the tear and brought over to 1 additional 4.75 peek swivel  lock anchor bringing the tendon nicely down over the prepared tuberosity.  The repair was smooth and watertight.  The coracoacromial ligament was taken down off the anterior acromion with the ArthroCare exposing a moderate anterior acromial spur. A high-speed bur was then used through the lateral portal to take down the anterior acromial spur from lateral to medial in a standard acromioplasty.  The acromioplasty was also viewed from the lateral portal and the bur was used as necessary to ensure that the acromion was completely flat from posterior to anterior.  The arthroscopic equipment was removed from the joint and the portals were closed with 3-0 nylon in an interrupted fashion. Sterile dressings were then applied including Xeroform 4 x 4's ABDs and tape. The patient was then allowed to awaken from general anesthesia, placed in a sling, transferred to the stretcher and taken to the recovery room in stable condition.   POSTOPERATIVE PLAN: The patient will be discharged home today and will followup in one week for suture removal and wound check.  She will follow the standard cuff protocol.

## 2021-08-22 NOTE — Progress Notes (Signed)
AssistedDr. Hatchett with left, ultrasound guided, interscalene  block. Side rails up, monitors on throughout procedure. See vital signs in flow sheet. Tolerated Procedure well.  

## 2021-08-22 NOTE — Discharge Instructions (Addendum)
Discharge Instructions after Arthroscopic Shoulder Repair   A sling has been provided for you. Remain in your sling at all times. This includes sleeping in your sling.  Use ice on the shoulder intermittently over the first 48 hours after surgery.  Pain medicine has been prescribed for you.  Use your medicine liberally over the first 48 hours, and then you can begin to taper your use. You may take Extra Strength Tylenol or Tylenol only in place of the pain pills. DO NOT take ANY nonsteroidal anti-inflammatory pain medications: Advil, Motrin, Ibuprofen, Aleve, Naproxen, or Narprosyn.  You may remove your dressing after two days. If the incision sites are still moist, place a Band-Aid over the moist site(s). Change Band-Aids daily until dry.  You may shower 5 days after surgery. The incisions CANNOT get wet prior to 5 days. Simply allow the water to wash over the site and then pat dry. Do not rub the incisions. Make sure your axilla (armpit) is completely dry after showering.  Take one aspirin a day for 2 weeks after surgery, unless you have an aspirin sensitivity/ allergy or asthma.   Please call [phone] during normal business hours or [phone] after hours for any problems. Including the following:  - excessive redness of the incisions - drainage for more than 4 days - fever of more than 101.5 F  *Please note that pain medications will not be refilled after hours or on weekends.  Instrucciones de alta despus de la reparacin artroscpica del hombro    Se le ha proporcionado un cabestrillo. Permanezca en su cabestrillo en todo momento. Esto incluye dormir en su cabestrillo.  Use hielo en el hombro de forma intermitente durante las primeras 48 horas despus de la [place].  Le han recetado analgsicos.  Use su medicamento generosamente durante las primeras 32 horas y luego puede comenzar a Production designer, theatre/television/film. Puede tomar Extra Strength Tylenol o Tylenol solo en lugar de las pastillas  para Conservation officer, historic buildings. NO tome NINGN analgsico antiinflamatorio no esteroideo: Advil, Motrin, Ibuprofen, Aleve, Naproxen o Narprosyn.  Puede quitarse el vendaje despus [place]. Si los sitios de la incisin todava estn hmedos, coloque una curita sobre los sitios hmedos. Cambie las tiritas diariamente hasta que se sequen.  Puede ducharse 5 das despus de la [place]. Las incisiones NO PUEDEN mojarse antes de los 5 das. Simplemente deje que el agua lave el sitio y luego squelo. No frote las incisiones. Asegrese de que su axila (sobaco) est completamente seca despus de la ducha.  Tome una aspirina al da durante 2 semanas despus de la ciruga, a menos que tenga sensibilidad/alergia a la aspirina o asma.   Llame al [phone] durante el horario comercial normal o al [phone] despus del horario de atencin si tiene algn problema. Incluyendo lo siguiente:  - enrojecimiento excesivo de las incisiones - drenaje por ms de 4 das - fiebre de ms de 101.5 F  *Tenga en cuenta que los analgsicos no se repondrn fuera del horario de atencin ni los fines de [place]. Regional Anesthesia Blocks  1. Numbness or the inability to move the "blocked" extremity may last from 3-48 hours after placement. The length of time depends on the medication injected and your individual response to the medication. If the numbness is not going away after 48 hours, call your surgeon.  2. The extremity that is blocked will need to be protected until the numbness is gone and the  Strength has returned. Because you cannot feel it, you  will need to take extra care to avoid injury. Because it may be weak, you may have difficulty moving it or using it. You may not know what position it is in without looking at it while the block is in effect.  3. For blocks in the legs and feet, returning to weight bearing and walking needs to be done carefully. You will need to wait until the numbness is entirely gone and the strength  has returned. You should be able to move your leg and foot normally before you try and bear weight or walk. You will need someone to be with you when you first try to ensure you do not fall and possibly risk injury.  4. Bruising and tenderness at the needle site are common side effects and will resolve in a few days.  5. Persistent numbness or new problems with movement should be communicated to the surgeon or the [place] [phone] [place] [phone]).   Post Anesthesia Home Care Instructions  Activity: Get plenty of rest for the remainder of the day. A responsible individual must stay with you for 24 hours following the procedure.  For the next 24 hours, DO NOT: -Drive a car -Paediatric nurse -Drink alcoholic beverages -Take any medication unless instructed by your physician -Make any legal decisions or sign important papers.  Meals: Start with liquid foods such as gelatin or soup. Progress to regular foods as tolerated. Avoid greasy, spicy, heavy foods. If nausea and/or vomiting occur, drink only clear liquids until the nausea and/or vomiting subsides. Call your physician if vomiting continues.  Special Instructions/Symptoms: Your throat may feel dry or sore from the anesthesia or the breathing tube placed in your throat during surgery. If this causes discomfort, gargle with warm salt water. The discomfort should disappear within 24 hours.  If you had a scopolamine patch placed behind your ear for the management of post- operative nausea and/or vomiting:  1. The medication in the patch is effective for 72 hours, after which it should be removed.  Wrap patch in a tissue and discard in the trash. Wash hands thoroughly with soap and water. 2. You may remove the patch earlier than 72 hours if you experience unpleasant side effects which may include dry mouth, dizziness or visual disturbances. 3. Avoid touching the patch. Wash your hands with soap  and water after contact with the patch.

## 2021-08-23 ENCOUNTER — Encounter (HOSPITAL_BASED_OUTPATIENT_CLINIC_OR_DEPARTMENT_OTHER): Payer: Self-pay | Admitting: Orthopedic Surgery

## 2021-09-08 ENCOUNTER — Encounter: Payer: Self-pay | Admitting: Physical Therapy

## 2021-09-08 ENCOUNTER — Ambulatory Visit: Payer: PRIVATE HEALTH INSURANCE | Attending: Orthopedic Surgery | Admitting: Physical Therapy

## 2021-09-08 DIAGNOSIS — M25512 Pain in left shoulder: Secondary | ICD-10-CM | POA: Diagnosis not present

## 2021-09-08 DIAGNOSIS — M6281 Muscle weakness (generalized): Secondary | ICD-10-CM | POA: Insufficient documentation

## 2021-09-08 NOTE — Therapy (Signed)
[place] [facility] [address] [place], [address] Phone: [phone]   Fax:  [phone]  Physical Therapy Evaluation  Patient Details  Name: Sayge Brienza MRN: [id number] Date of Birth: [birth year] Referring Provider (PT): Tania Ade MD   Encounter Date: 09/08/2021   [facility] PT Assessment - 09/08/21 0001       Assessment   Medical Diagnosis L shoulder arthroscopy with decompression    Referring Provider (PT) Tania Ade MD    Onset Date/Surgical Date 08/22/21    Hand Dominance Right    Next MD Visit Jan 2023    Prior Therapy Yes for this shoulder prior to surgery               PT End of Session - 09/08/21 1415     Visit Number 1    Number of Visits 17    Date for PT Re-Evaluation 11/04/21    Authorization - Visit Number 1    Authorization - Number of Visits 10    PT Start Time 6301    PT Stop Time 1430    PT Time Calculation (min) 42 min    Activity Tolerance Patient tolerated treatment well    Behavior During Therapy [facility] for tasks assessed/performed             Past Medical History:  Diagnosis Date   Fatty liver    Pre-diabetes     Past Surgical History:  Procedure Laterality Date   ABDOMINAL HYSTERECTOMY     APPENDECTOMY     CESAREAN SECTION     CESAREAN SECTION     COLONOSCOPY WITH PROPOFOL N/A 06/02/2021   Procedure: COLONOSCOPY WITH PROPOFOL;  Surgeon: Lin Landsman, MD;  Location: [facility] ENDOSCOPY;  Service: Gastroenterology;  Laterality: N/A;  SPANISH INTERPRETER   LAPAROSCOPIC HYSTERECTOMY     LAPAROSCOPIC OOPHERECTOMY Right    SHOULDER ARTHROSCOPY WITH ROTATOR CUFF REPAIR AND SUBACROMIAL DECOMPRESSION Left 08/22/2021   Procedure: SHOULDER ARTHROSCOPY WITH ROTATOR CUFF REPAIR AND SUBACROMIAL DECOMPRESSION;  Surgeon: Tania Ade, MD;  Location: [facility];  Service: Orthopedics;  Laterality: Left;    There were no vitals filed for this  visit.    Subjective Assessment - 09/08/21 1353     Pertinent History Pt is a 60 year old female presenting s/p L shoulder arthroscopy supraspinatus repair and subacromial decompression 08/22/21. Reports post surgical pain has been 9/10 since surgery. Is in a sling currently all hours of the day other than to shower. Has returned to surgeon 09/01/21 and got a good report. Has another appt scheduled for follow up in January. Reports incision has no drainage, but it is very senstivie to light touch. 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 In addition to full time work she reports walking 26mins 3-4x/week and she would like to return to this.  Pt reports she would like to return to her job after rehab but her boss has already told her she no longer has a job. Post surgical pain is as high as 9/10 with trunk rotation and trying to change clothes; and as low (and currently) 0/10. Pain is somewhat alleiviated by ice and pain medication. Patient lives with daughter who has been helping her though she works a lot (pt is alone often), has about 4 steps to enter 1 story home. Patient is currently not driving, daughter drives her to appts, her  daughter makes meals and she is able to feed herself independently, is bathing modI ind standig in walkin shower soley with R hand, daughter fixing her hair for her, and dressing modI with no one else assisting her physically but she needs to prop her arm with her other had and it is difficult. Sleeps in her bed on her back, with several pillows around her for comfort and to support her arm. Pt denies N/V, B&B changes, unexplained weight fluctuation, saddle paresthesia, fever, night sweats, or unrelenting night pain at this time.    Limitations Lifting;Reading;Writing;Walking;House hold activities    How long can you sit comfortably? unlimited    How long can you stand comfortably? unlimited    How long can you  walk comfortably? unlimited    Diagnostic tests none post surgery    Patient Stated Goals restore motion/strength in shoulder in order to complete normal household tasks    Currently in Pain? Yes    Pain Score 0-No pain    Pain Location Shoulder    Pain Orientation Left    Pain Descriptors / Indicators Tingling;Sharp    Pain Type Surgical pain    Pain Radiating Towards none    Pain Onset 1 to 4 weeks ago    Pain Frequency Intermittent    Aggravating Factors  any motion that moves the shoulder, trunk rotation    Pain Relieving Factors rest    Effect of Pain on Daily Activities pain medication    Multiple Pain Sites No               OBJECTIVE  MUSCULOSKELETAL: Tremor: Normal Bulk: Normal Tone: Normal  Elbow Screen Elbow AROM: WNL on R side  Palpation Very tender to light touch to lateral incision of R shoulder, reports concordant pain sign Trigger points with secondary pain at R UT and levator scapulae  Strength R ONLY 5/5 Shoulder flexion (anterior deltoid/pec major/coracobrachialis, axillary n. (C5-6) and musculocutaneous n. (C5-7)) 5/5 Shoulder abduction (deltoid/supraspinatus, axillary/suprascapular n, C5) 4+/5 Shoulder external rotation (infraspinatus/teres minor) 5/5 Shoulder internal rotation (subcapularis/lats/pec major) 5/5 Shoulder extension (posterior deltoid, lats, teres major, axillary/thoracodorsal n.) 5/5 Elbow flexion (biceps brachii, brachialis, brachioradialis, musculoskeletal n, C5-6) 5/5 Elbow extension (triceps, radial n, C7)  AROM R/L All R shoulder/elbow/wrist motions WNL L wrist and active elbow in supine motions WNL *Indicates pain, overpressure performed unless otherwise indicated  PROM R/L All R shoulder motions WNL L wrist and elbow PROM WNL *Indicates pain, overpressure performed unless otherwise indicated  Accessory Motions/Glides Glenohumeral: Posterior: R: normal L: not examined Inferior: R: normal L: not  examined Anterior: R: normal L: not examined  Acromioclavicular:  Posterior: R: normal L: not examined Anterior: R: normal L: not examined NEUROLOGICAL:  Mental Status Patient is oriented to person, place and time.  Recent memory is intact.  Remote memory is intact.  Attention span and concentration are intact.  Expressive speech is intact.  Patient's fund of knowledge is within normal limits for educational level.  Sensation Grossly intact to light touch bilateral UE as determined by testing dermatomes C2-T2 Hypersensitivity to light touch of lateral incision Proprioception and hot/cold testing deferred on this date  Ther-Ex PT reviewed the following HEP with patient with patient able to demonstrate a set of the following with min cuing for correction needed. PT educated patient on parameters of therex (how/when to inc/decrease intensity, frequency, rep/set range, stretch hold time, and purpose of therex) with verbalized understanding.  Ice 2-3x/day (at least) 10-3mins Touch to incision site (  light to start) for habituation 2x (at least) 65mins/day Scapular retractions to neutral x12 every hour       [facility] PT Assessment - 09/08/21 0001       Assessment   Medical Diagnosis L shoulder arthroscopy with decompression    Referring Provider (PT) Tania Ade MD    Onset Date/Surgical Date 08/22/21    Hand Dominance Right    Next MD Visit Jan 2023    Prior Therapy Yes for this shoulder prior to surgery                    Objective measurements completed on examination: See above findings.                PT Education - 09/08/21 1414     Education Details Patient was educated on diagnosis, anatomy and pathology involved, prognosis, role of PT, and was given an HEP, demonstrating exercise with proper form following verbal and tactile cues, and was given a paper hand out to continue exercise at home. Pt was educated on and agreed to plan of care.     Person(s) Educated Patient    Methods Explanation;Demonstration;Verbal cues    Comprehension Verbalized understanding;Returned demonstration;Verbal cues required              PT Short Term Goals - 09/08/21 1440       PT SHORT TERM GOAL #1   Title Pt will be independent with HEP in order to improve strength and decrease pain in order to improve pain-free function at home and work.    Baseline 09/08/21 HEP given    Time 4    Period Weeks    Status New               PT Long Term Goals - 09/08/21 1444       PT LONG TERM GOAL #1   Title Patient will increase FOTO score to 44 to demonstrate predicted increase in functional mobility to complete ADLs    Baseline 09/08/21 4    Time 12    Period Weeks      PT LONG TERM GOAL #2   Title Pt will decrease worst pain as reported on NPRS by at least 3 points in order to demonstrate clinically significant reduction in pain.    Baseline 09/08/21 9/10    Time 12    Period Weeks    Status New      PT LONG TERM GOAL #3   Title Pt will demonstrate full active L shoulder motion in order to complete self care ADLS    Baseline 30/8/65 unable to attempt    Time 12    Period Weeks    Status New      PT LONG TERM GOAL #4   Title Patient will demonstrate gross L shoulder MMTs of 4+/5 in order to be able to complete heavy household tasks    Baseline 11/09/20 unable to assess    Time 12    Period Weeks    Status New                    Plan - 09/08/21 1629     Clinical Impression Statement Pt is a 60 year old female s/p L shoulder arthroscopy supraspinatus repair and subacromial decompression 08/22/21. Impairments in shoulder mobility, scapulohumeral rhythm and motor control, shoulder and periscapular strength, sleeping, and pain. 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 Patient with hypersensitivity/neuropathic pain to incision  site, educated on hibituation with touch with understanding. Pt will benefit from skilled PT to address aforementioned impairments to return to optimal PLOF.    Personal Factors and Comorbidities Age;Comorbidity 1;Fitness;Past/Current Experience;Time since onset of injury/illness/exacerbation    Comorbidities pre-diabetes    Examination-Activity Limitations Carry;Lift;Reach Overhead;Sleep    Examination-Participation Restrictions Occupation;Driving;Community Activity;Cleaning;Laundry;Meal Prep    Stability/Clinical Decision Making Evolving/Moderate complexity    Clinical Decision Making Moderate    Rehab Potential Good    PT Frequency 2x / week    PT Duration 12 weeks    PT Treatment/Interventions ADLs/Self Care Home Management;Aquatic Therapy;Electrical Stimulation;Moist Heat;Ultrasound;DME Instruction;Functional mobility training;Therapeutic activities;Therapeutic exercise;Neuromuscular re-education;Patient/family education;Manual techniques;Passive range of motion;Energy conservation;Taping;Vasopneumatic Device;Joint Manipulations;Cryotherapy;Traction;Dry needling    PT Next Visit Plan PROM, scap clocks, HEP    PT Home Exercise Plan scapular retractions, elbow motion, touch to incision site hibituation    Consulted and Agree with Plan of Care Patient             Patient will benefit from skilled therapeutic intervention in order to improve the following deficits and impairments:  Decreased range of motion, Improper body mechanics, Decreased activity tolerance, Decreased strength, Impaired UE functional use, Decreased mobility, Difficulty walking, Decreased endurance, Postural dysfunction, Increased muscle spasms, Impaired flexibility, Impaired tone, Hypomobility, Increased fascial restricitons, Pain  Visit Diagnosis: Acute pain of left shoulder     Problem List Patient Active Problem List   Diagnosis Date Noted   Screen for colon cancer    Muscle cramps 05/28/2018   Pre-diabetes  05/28/2018   Fatty liver 05/28/2018   Hair loss 05/28/2018   Sciatica 11/01/2015   S/P right oophorectomy 11/01/2015   S/P hysterectomy 11/01/2015   Durwin Reges DPT Durwin Reges, PT 09/08/2021, 4:54 PM  [place] [facility] [address] [place], [address] Phone: [phone]   Fax:  [phone]  Name: Maurene Hollin MRN: [id number] Date of Birth: July 28, 1961

## 2021-09-13 ENCOUNTER — Ambulatory Visit: Payer: PRIVATE HEALTH INSURANCE | Admitting: Physical Therapy

## 2021-09-15 ENCOUNTER — Ambulatory Visit: Payer: PRIVATE HEALTH INSURANCE | Admitting: Physical Therapy

## 2021-09-15 DIAGNOSIS — M25512 Pain in left shoulder: Secondary | ICD-10-CM | POA: Diagnosis not present

## 2021-09-15 NOTE — Therapy (Signed)
[place] [facility] [address] [place], [address] Phone: [phone]   Fax:  [phone]  Physical Therapy Treatment  Patient Details  Name: Carolyn Sampson MRN: [id number] Date of Birth: 04/07/1961 Referring Provider (PT): Tania Ade MD   Encounter Date: 09/15/2021   PT End of Session - 09/15/21 1430     Visit Number 2    Number of Visits 17    Date for PT Re-Evaluation 11/04/21    Authorization - Visit Number 2    Authorization - Number of Visits 10    PT Start Time 2426    PT Stop Time 1421    PT Time Calculation (min) 28 min    Activity Tolerance Patient tolerated treatment well    Behavior During Therapy [facility] for tasks assessed/performed             Past Medical History:  Diagnosis Date   Fatty liver    Pre-diabetes     Past Surgical History:  Procedure Laterality Date   ABDOMINAL HYSTERECTOMY     APPENDECTOMY     CESAREAN SECTION     CESAREAN SECTION     COLONOSCOPY WITH PROPOFOL N/A 06/02/2021   Procedure: COLONOSCOPY WITH PROPOFOL;  Surgeon: Lin Landsman, MD;  Location: [facility];  Service: Gastroenterology;  Laterality: N/A;  SPANISH INTERPRETER   LAPAROSCOPIC HYSTERECTOMY     LAPAROSCOPIC OOPHERECTOMY Right    SHOULDER ARTHROSCOPY WITH ROTATOR CUFF REPAIR AND SUBACROMIAL DECOMPRESSION Left 08/22/2021   Procedure: SHOULDER ARTHROSCOPY WITH ROTATOR CUFF REPAIR AND SUBACROMIAL DECOMPRESSION;  Surgeon: Tania Ade, MD;  Location: [place];  Service: Orthopedics;  Laterality: Left;    There were no vitals filed for this visit.   Subjective Assessment - 09/15/21 1428     Subjective Pt doing well overall. Still having some post surgical pain. Reports pain to light touch to incision site that feels like small needles. Still wearing sling at all times. No other updates to note. Present with daughter    Pertinent History Pt is a 60 year old female  presenting s/p L shoulder arthroscopy supraspinatus repair and subacromial decompression 08/22/21. Reports post surgical pain has been 9/10 since surgery. Is in a sling currently all hours of the day other than to shower. Has returned to surgeon 09/01/21 and got a good report. Has another appt scheduled for follow up in January. Reports incision has no drainage, but it is very senstivie to light touch. 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 In addition to full time work she reports walking 78mins 3-4x/week and she would like to return to this.  Pt reports she would like to return to her job after rehab but her boss has already told her she no longer has a job. Post surgical pain is as high as 9/10 with trunk rotation and trying to change clothes; and as low (and currently) 0/10. Pain is somewhat alleiviated by ice and pain medication. Patient lives with daughter who has been helping her though she works a lot (pt is alone often), has about 4 steps to enter 1 story home. Patient is currently not driving, daughter drives her to appts, her daughter makes meals and she is able to feed herself independently, is bathing modI ind standig in walkin shower soley with R hand, daughter fixing her hair for her, and dressing modI with no one else assisting her  physically but she needs to prop her arm with her other had and it is difficult. Sleeps in her bed on her back, with several pillows around her for comfort and to support her arm. Pt denies N/V, B&B changes, unexplained weight fluctuation, saddle paresthesia, fever, night sweats, or unrelenting night pain at this time.    Limitations Lifting;Reading;Writing;Walking;House hold activities    How long can you sit comfortably? unlimited    How long can you stand comfortably? unlimited    How long can you walk comfortably? unlimited    Diagnostic tests none post surgery    Patient Stated Goals restore  motion/strength in shoulder in order to complete normal household tasks    Pain Onset 1 to 4 weeks ago             Manual PROM all directions with consistent cuing for patient to "relax" shoulder with good compliance, in pain free range STM with trigger point release  to L UT Desensitization light touch over superior incision site with education on self desensitization at home  Ther-Ex AROM elbow ext <> flex x15 in supine  Wrist flex <>ext x15 in supine Wrist ulnar <> radial deviation x12 in supine minimal scapular retractions in brace x12 in sitting, cuing for minimal movement                          PT Education - 09/15/21 1429     Education Details therex form/technique, desensitization of incision site    Person(s) Educated Patient;Child(ren)    Methods Explanation;Demonstration;Verbal cues    Comprehension Returned demonstration;Verbalized understanding              PT Short Term Goals - 09/08/21 1440       PT SHORT TERM GOAL #1   Title Pt will be independent with HEP in order to improve strength and decrease pain in order to improve pain-free function at home and work.    Baseline 09/08/21 HEP given    Time 4    Period Weeks    Status New               PT Long Term Goals - 09/08/21 1444       PT LONG TERM GOAL #1   Title Patient will increase FOTO score to 44 to demonstrate predicted increase in functional mobility to complete ADLs    Baseline 09/08/21 4    Time 12    Period Weeks      PT LONG TERM GOAL #2   Title Pt will decrease worst pain as reported on NPRS by at least 3 points in order to demonstrate clinically significant reduction in pain.    Baseline 09/08/21 9/10    Time 12    Period Weeks    Status New      PT LONG TERM GOAL #3   Title Pt will demonstrate full active L shoulder motion in order to complete self care ADLS    Baseline 07/02/74 unable to attempt    Time 12    Period Weeks    Status New      PT  LONG TERM GOAL #4   Title Patient will demonstrate gross L shoulder MMTs of 4+/5 in order to be able to complete heavy household tasks    Baseline 11/09/20 unable to assess    Time 12    Period Weeks    Status New  Plan - 09/15/21 1430     Clinical Impression Statement PT completes PROM and soft tissue techniques within painfree range to maintain integrity of joint, within surgical protocol with success. PT educated patient and daughter on desensitization of incision site with understanding. Paitent able to demonstrate understanding of elow and wrist AROM only in supine position with shoulder relaxed. PT will continue progression as able.    Personal Factors and Comorbidities Age;Comorbidity 1;Fitness;Past/Current Experience;Time since onset of injury/illness/exacerbation    Comorbidities pre-diabetes    Examination-Activity Limitations Carry;Lift;Reach Overhead;Sleep    Examination-Participation Restrictions Occupation;Driving;Community Activity;Cleaning;Laundry;Meal Prep    Stability/Clinical Decision Making Evolving/Moderate complexity    Clinical Decision Making Moderate    Rehab Potential Good    PT Frequency 2x / week    PT Duration 12 weeks    PT Treatment/Interventions ADLs/Self Care Home Management;Aquatic Therapy;Electrical Stimulation;Moist Heat;Ultrasound;DME Instruction;Functional mobility training;Therapeutic activities;Therapeutic exercise;Neuromuscular re-education;Patient/family education;Manual techniques;Passive range of motion;Energy conservation;Taping;Vasopneumatic Device;Joint Manipulations;Cryotherapy;Traction;Dry needling    PT Next Visit Plan PROM, scap clocks, HEP    PT Home Exercise Plan scapular retractions, elbow motion, touch to incision site hibituation    Consulted and Agree with Plan of Care Patient             Patient will benefit from skilled therapeutic intervention in order to improve the following deficits and impairments:   Decreased range of motion, Improper body mechanics, Decreased activity tolerance, Decreased strength, Impaired UE functional use, Decreased mobility, Difficulty walking, Decreased endurance, Postural dysfunction, Increased muscle spasms, Impaired flexibility, Impaired tone, Hypomobility, Increased fascial restricitons, Pain  Visit Diagnosis: Acute pain of left shoulder     Problem List Patient Active Problem List   Diagnosis Date Noted   Screen for colon cancer    Muscle cramps 05/28/2018   Pre-diabetes 05/28/2018   Fatty liver 05/28/2018   Hair loss 05/28/2018   Sciatica 11/01/2015   S/P right oophorectomy 11/01/2015   S/P hysterectomy 11/01/2015   Durwin Reges DPT Durwin Reges, PT 09/15/2021, 2:33 PM  [place] [facility] [address], [place], [address] Phone: [phone]   Fax:  [phone]  Name: Dyonna Jaspers MRN: [id number] Date of Birth: 10/13/1960

## 2021-09-20 ENCOUNTER — Encounter: Payer: Self-pay | Admitting: Physical Therapy

## 2021-09-20 ENCOUNTER — Ambulatory Visit: Payer: PRIVATE HEALTH INSURANCE | Admitting: Physical Therapy

## 2021-09-20 DIAGNOSIS — M25512 Pain in left shoulder: Secondary | ICD-10-CM | POA: Diagnosis not present

## 2021-09-20 DIAGNOSIS — M6281 Muscle weakness (generalized): Secondary | ICD-10-CM

## 2021-09-20 NOTE — Therapy (Signed)
[place] [facility] [address] [place], [address] Phone: [phone]   Fax:  [phone]  Physical Therapy Treatment  Patient Details  Name: Carolyn Sampson MRN: [id number] Date of Birth: 1961-05-26 Referring Provider (PT): Tania Ade MD   Encounter Date: 09/20/2021   PT End of Session - 09/20/21 1325     Visit Number 3    Authorization - Visit Number 3    Authorization - Number of Visits 10    PT Start Time 1302    PT Stop Time 1330    PT Time Calculation (min) 28 min    Activity Tolerance Patient tolerated treatment well    Behavior During Therapy [facility] for tasks assessed/performed             Past Medical History:  Diagnosis Date   Fatty liver    Pre-diabetes     Past Surgical History:  Procedure Laterality Date   ABDOMINAL HYSTERECTOMY     APPENDECTOMY     CESAREAN SECTION     CESAREAN SECTION     COLONOSCOPY WITH PROPOFOL N/A 06/02/2021   Procedure: COLONOSCOPY WITH PROPOFOL;  Surgeon: Lin Landsman, MD;  Location: [facility];  Service: Gastroenterology;  Laterality: N/A;  SPANISH INTERPRETER   LAPAROSCOPIC HYSTERECTOMY     LAPAROSCOPIC OOPHERECTOMY Right    SHOULDER ARTHROSCOPY WITH ROTATOR CUFF REPAIR AND SUBACROMIAL DECOMPRESSION Left 08/22/2021   Procedure: SHOULDER ARTHROSCOPY WITH ROTATOR CUFF REPAIR AND SUBACROMIAL DECOMPRESSION;  Surgeon: Tania Ade, MD;  Location: [place];  Service: Orthopedics;  Laterality: Left;    There were no vitals filed for this visit.   Subjective Assessment - 09/20/21 1321     Subjective Pt reports some soreness following last sesion, doing fairly well overall. Compliance with HEP. Presents with daughter    Pertinent History Pt is a 60 year old female presenting s/p L shoulder arthroscopy supraspinatus repair and subacromial decompression 08/22/21. Reports post surgical pain has been 9/10 since surgery. Is in a sling  currently all hours of the day other than to shower. Has returned to surgeon 09/01/21 and got a good report. Has another appt scheduled for follow up in January. Reports incision has no drainage, but it is very senstivie to light touch. 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 In addition to full time work she reports walking 61mins 3-4x/week and she would like to return to this.  Pt reports she would like to return to her job after rehab but her boss has already told her she no longer has a job. Post surgical pain is as high as 9/10 with trunk rotation and trying to change clothes; and as low (and currently) 0/10. Pain is somewhat alleiviated by ice and pain medication. Patient lives with daughter who has been helping her though she works a lot (pt is alone often), has about 4 steps to enter 1 story home. Patient is currently not driving, daughter drives her to appts, her daughter makes meals and she is able to feed herself independently, is bathing modI ind standig in walkin shower soley with R hand, daughter fixing her hair for her, and dressing modI with no one else assisting her physically but she needs to prop her arm with her other had and it is difficult. Sleeps in her bed on her back, with several pillows around her for comfort and to support her arm.  Pt denies N/V, B&B changes, unexplained weight fluctuation, saddle paresthesia, fever, night sweats, or unrelenting night pain at this time.    Limitations Lifting;Reading;Writing;Walking;House hold activities    How long can you sit comfortably? unlimited    How long can you stand comfortably? unlimited    How long can you walk comfortably? unlimited    Diagnostic tests none post surgery    Patient Stated Goals restore motion/strength in shoulder in order to complete normal household tasks    Pain Onset 1 to 4 weeks ago              Manual PROM all directions with consistent  cuing for patient to "relax" shoulder with good compliance, in pain free range STM with trigger point release  to L UT and bicep muscle bely   Ther-Ex AROM elbow ext <> flex x15 in supine (elbow ext + wrist ext for bicep stretch Wrist flex <>ext x15 in supine Wrist ulnar <> radial deviation x12 in supine Supine scapular retractions x12 with good carry over of cuing                           PT Education - 09/20/21 1324     Education Details therex form/technique    Person(s) Educated Patient    Methods Explanation;Demonstration;Verbal cues    Comprehension Verbalized understanding;Verbal cues required;Returned demonstration              PT Short Term Goals - 09/08/21 1440       PT SHORT TERM GOAL #1   Title Pt will be independent with HEP in order to improve strength and decrease pain in order to improve pain-free function at home and work.    Baseline 09/08/21 HEP given    Time 4    Period Weeks    Status New               PT Long Term Goals - 09/08/21 1444       PT LONG TERM GOAL #1   Title Patient will increase FOTO score to 44 to demonstrate predicted increase in functional mobility to complete ADLs    Baseline 09/08/21 4    Time 12    Period Weeks      PT LONG TERM GOAL #2   Title Pt will decrease worst pain as reported on NPRS by at least 3 points in order to demonstrate clinically significant reduction in pain.    Baseline 09/08/21 9/10    Time 12    Period Weeks    Status New      PT LONG TERM GOAL #3   Title Pt will demonstrate full active L shoulder motion in order to complete self care ADLS    Baseline 35/0/09 unable to attempt    Time 12    Period Weeks    Status New      PT LONG TERM GOAL #4   Title Patient will demonstrate gross L shoulder MMTs of 4+/5 in order to be able to complete heavy household tasks    Baseline 11/09/20 unable to assess    Time 12    Period Weeks    Status New                   Plan  - 09/20/21 1336     Clinical Impression Statement PT continued PROM only to GHJ and active motion of supporting joints in pain free range with success.  Patient is able to comply with all cuing for proper technique of therex without pain with active ROM of elbow, wrist, and scapulae in supine. Patient with increased tension to L bicep and UT, somewhat resolves with manual techniques. PT will continue progression as able.    Personal Factors and Comorbidities Age;Comorbidity 1;Fitness;Past/Current Experience;Time since onset of injury/illness/exacerbation    Comorbidities pre-diabetes    Examination-Activity Limitations Carry;Lift;Reach Overhead;Sleep    Examination-Participation Restrictions Occupation;Driving;Community Activity;Cleaning;Laundry;Meal Prep    Stability/Clinical Decision Making Evolving/Moderate complexity    Clinical Decision Making Moderate    Rehab Potential Good    PT Frequency 2x / week    PT Duration 12 weeks    PT Treatment/Interventions ADLs/Self Care Home Management;Aquatic Therapy;Electrical Stimulation;Moist Heat;Ultrasound;DME Instruction;Functional mobility training;Therapeutic activities;Therapeutic exercise;Neuromuscular re-education;Patient/family education;Manual techniques;Passive range of motion;Energy conservation;Taping;Vasopneumatic Device;Joint Manipulations;Cryotherapy;Traction;Dry needling    PT Next Visit Plan PROM, scap clocks, HEP    PT Home Exercise Plan scapular retractions, elbow motion, touch to incision site hibituation    Consulted and Agree with Plan of Care Patient             Patient will benefit from skilled therapeutic intervention in order to improve the following deficits and impairments:  Decreased range of motion, Improper body mechanics, Decreased activity tolerance, Decreased strength, Impaired UE functional use, Decreased mobility, Difficulty walking, Decreased endurance, Postural dysfunction, Increased muscle spasms, Impaired  flexibility, Impaired tone, Hypomobility, Increased fascial restricitons, Pain  Visit Diagnosis: Acute pain of left shoulder  Muscle weakness (generalized)     Problem List Patient Active Problem List   Diagnosis Date Noted   Screen for colon cancer    Muscle cramps 05/28/2018   Pre-diabetes 05/28/2018   Fatty liver 05/28/2018   Hair loss 05/28/2018   Sciatica 11/01/2015   S/P right oophorectomy 11/01/2015   S/P hysterectomy 11/01/2015   Durwin Reges DPT Durwin Reges, PT 09/20/2021, 1:42 PM  [place] [facility] [address] [place], [address] Phone: [phone]   Fax:  [phone]  Name: Elisheba Mcdonnell MRN: [id number] Date of Birth: 1960/11/16

## 2021-09-22 ENCOUNTER — Ambulatory Visit: Payer: PRIVATE HEALTH INSURANCE | Admitting: Physical Therapy

## 2021-09-27 ENCOUNTER — Ambulatory Visit: Payer: PRIVATE HEALTH INSURANCE | Admitting: Physical Therapy

## 2021-09-29 ENCOUNTER — Encounter: Payer: 59 | Admitting: Physical Therapy

## 2021-09-29 ENCOUNTER — Ambulatory Visit: Payer: PRIVATE HEALTH INSURANCE | Attending: Orthopedic Surgery | Admitting: Physical Therapy

## 2021-09-29 DIAGNOSIS — M6281 Muscle weakness (generalized): Secondary | ICD-10-CM | POA: Diagnosis present

## 2021-09-29 DIAGNOSIS — M25512 Pain in left shoulder: Secondary | ICD-10-CM | POA: Diagnosis present

## 2021-09-29 DIAGNOSIS — M25612 Stiffness of left shoulder, not elsewhere classified: Secondary | ICD-10-CM | POA: Diagnosis present

## 2021-09-29 NOTE — Therapy (Signed)
[place] [facility] [address] [place], [address] Phone: [phone]   Fax:  [phone]  Physical Therapy Treatment  Patient Details  Name: Carolyn Sampson MRN: [id number] Date of Birth: 1961-05-11 Referring Provider (PT): Tania Ade MD   Encounter Date: 09/29/2021   PT End of Session - 09/29/21 1320     Visit Number 4    Number of Visits 17    Date for PT Re-Evaluation 11/04/21    Authorization - Visit Number 4    Authorization - Number of Visits 10    PT Start Time 6962    PT Stop Time 1333    PT Time Calculation (min) 30 min    Activity Tolerance Patient tolerated treatment well    Behavior During Therapy [facility] for tasks assessed/performed             Past Medical History:  Diagnosis Date   Fatty liver    Pre-diabetes     Past Surgical History:  Procedure Laterality Date   ABDOMINAL HYSTERECTOMY     APPENDECTOMY     CESAREAN SECTION     CESAREAN SECTION     COLONOSCOPY WITH PROPOFOL N/A 06/02/2021   Procedure: COLONOSCOPY WITH PROPOFOL;  Surgeon: Lin Landsman, MD;  Location: [facility];  Service: Gastroenterology;  Laterality: N/A;  SPANISH INTERPRETER   LAPAROSCOPIC HYSTERECTOMY     LAPAROSCOPIC OOPHERECTOMY Right    SHOULDER ARTHROSCOPY WITH ROTATOR CUFF REPAIR AND SUBACROMIAL DECOMPRESSION Left 08/22/2021   Procedure: SHOULDER ARTHROSCOPY WITH ROTATOR CUFF REPAIR AND SUBACROMIAL DECOMPRESSION;  Surgeon: Tania Ade, MD;  Location: [place];  Service: Orthopedics;  Laterality: Left;    There were no vitals filed for this visit.   Subjective Assessment - 09/29/21 1312     Subjective Pt reports she is having some pain, is trying to relax her shoulder in her sling, but does feel like she is staying tense. Sees surgeon next week. Reports 3/10 pain today.    Pertinent History Pt is a 60 year old female presenting s/p L shoulder arthroscopy supraspinatus  repair and subacromial decompression 08/22/21. Reports post surgical pain has been 9/10 since surgery. Is in a sling currently all hours of the day other than to shower. Has returned to surgeon 09/01/21 and got a good report. Has another appt scheduled for follow up in January. Reports incision has no drainage, but it is very senstivie to light touch. 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 In addition to full time work she reports walking 2mins 3-4x/week and she would like to return to this.  Pt reports she would like to return to her job after rehab but her boss has already told her she no longer has a job. Post surgical pain is as high as 9/10 with trunk rotation and trying to change clothes; and as low (and currently) 0/10. Pain is somewhat alleiviated by ice and pain medication. Patient lives with daughter who has been helping her though she works a lot (pt is alone often), has about 4 steps to enter 1 story home. Patient is currently not driving, daughter drives her to appts, her daughter makes meals and she is able to feed herself independently, is bathing modI ind standig in walkin shower soley with R hand, daughter fixing her hair for her, and dressing modI with no one else assisting her physically but she needs to  prop her arm with her other had and it is difficult. Sleeps in her bed on her back, with several pillows around her for comfort and to support her arm. Pt denies N/V, B&B changes, unexplained weight fluctuation, saddle paresthesia, fever, night sweats, or unrelenting night pain at this time.    Limitations Lifting;Reading;Writing;Walking;House hold activities    How long can you sit comfortably? unlimited    How long can you stand comfortably? unlimited    How long can you walk comfortably? unlimited    Diagnostic tests none post surgery    Patient Stated Goals restore motion/strength in shoulder in order to complete  normal household tasks    Pain Onset 1 to 4 weeks ago             Manual PROM all directions with consistent cuing for patient to "relax" shoulder with good compliance (approx 90d flex/abd, 45d ER/IR), in pain free range STM with trigger point release  to L UT and bicep muscle belly   Ther-Ex AROM elbow ext <> flex x15 in supine (elbow ext + wrist ext for bicep stretch Wrist flex <>ext x15 in supine Wrist ulnar <> radial deviation x12 in supine Supine scapular retractions x12 with good carry over of cuing                            PT Education - 09/29/21 1318     Education Details therex form/technique    Person(s) Educated Patient    Methods Explanation;Demonstration;Verbal cues    Comprehension Verbalized understanding;Returned demonstration;Verbal cues required              PT Short Term Goals - 09/08/21 1440       PT SHORT TERM GOAL #1   Title Pt will be independent with HEP in order to improve strength and decrease pain in order to improve pain-free function at home and work.    Baseline 09/08/21 HEP given    Time 4    Period Weeks    Status New               PT Long Term Goals - 09/08/21 1444       PT LONG TERM GOAL #1   Title Patient will increase FOTO score to 44 to demonstrate predicted increase in functional mobility to complete ADLs    Baseline 09/08/21 4    Time 12    Period Weeks      PT LONG TERM GOAL #2   Title Pt will decrease worst pain as reported on NPRS by at least 3 points in order to demonstrate clinically significant reduction in pain.    Baseline 09/08/21 9/10    Time 12    Period Weeks    Status New      PT LONG TERM GOAL #3   Title Pt will demonstrate full active L shoulder motion in order to complete self care ADLS    Baseline 50/0/93 unable to attempt    Time 12    Period Weeks    Status New      PT LONG TERM GOAL #4   Title Patient will demonstrate gross L shoulder MMTs of 4+/5 in order to be  able to complete heavy household tasks    Baseline 11/09/20 unable to assess    Time 12    Period Weeks    Status New  Plan - 09/29/21 1326     Clinical Impression Statement PT continued PROM and AROM therex of supporting joints in pain free range. Pt with increased tension to L bicep, and UT that resolves somewhat with manual techniques. PT continued education on relaxing shoulder in sling with understanding. Pt returns to surgeon next week, PT will continue progression as able.    Personal Factors and Comorbidities Age;Comorbidity 1;Fitness;Past/Current Experience;Time since onset of injury/illness/exacerbation    Comorbidities pre-diabetes    Examination-Activity Limitations Carry;Lift;Reach Overhead;Sleep    Examination-Participation Restrictions Occupation;Driving;Community Activity;Cleaning;Laundry;Meal Prep    Stability/Clinical Decision Making Evolving/Moderate complexity    Clinical Decision Making Moderate    Rehab Potential Good    PT Frequency 2x / week    PT Duration 12 weeks    PT Treatment/Interventions ADLs/Self Care Home Management;Aquatic Therapy;Electrical Stimulation;Moist Heat;Ultrasound;DME Instruction;Functional mobility training;Therapeutic activities;Therapeutic exercise;Neuromuscular re-education;Patient/family education;Manual techniques;Passive range of motion;Energy conservation;Taping;Vasopneumatic Device;Joint Manipulations;Cryotherapy;Traction;Dry needling    PT Next Visit Plan PROM, scap clocks, HEP    PT Home Exercise Plan scapular retractions, elbow motion, touch to incision site hibituation    Consulted and Agree with Plan of Care Patient             Patient will benefit from skilled therapeutic intervention in order to improve the following deficits and impairments:  Decreased range of motion, Improper body mechanics, Decreased activity tolerance, Decreased strength, Impaired UE functional use, Decreased mobility, Difficulty  walking, Decreased endurance, Postural dysfunction, Increased muscle spasms, Impaired flexibility, Impaired tone, Hypomobility, Increased fascial restricitons, Pain  Visit Diagnosis: Acute pain of left shoulder  Muscle weakness (generalized)  Stiffness of left shoulder, not elsewhere classified     Problem List Patient Active Problem List   Diagnosis Date Noted   Screen for colon cancer    Muscle cramps 05/28/2018   Pre-diabetes 05/28/2018   Fatty liver 05/28/2018   Hair loss 05/28/2018   Sciatica 11/01/2015   S/P right oophorectomy 11/01/2015   S/P hysterectomy 11/01/2015   Durwin Reges DPT Durwin Reges, PT 09/29/2021, 1:49 PM  [place] [facility] [address] [place], [address] Phone: [phone]   Fax:  [phone]  Name: Carolyn Sampson MRN: [id number] Date of Birth: 09/16/1961

## 2021-10-04 ENCOUNTER — Ambulatory Visit: Payer: PRIVATE HEALTH INSURANCE | Attending: Orthopedic Surgery | Admitting: Physical Therapy

## 2021-10-04 ENCOUNTER — Encounter: Payer: Self-pay | Admitting: Physical Therapy

## 2021-10-04 DIAGNOSIS — M25512 Pain in left shoulder: Secondary | ICD-10-CM | POA: Diagnosis not present

## 2021-10-04 NOTE — Therapy (Signed)
[facility] [address], [place], [address] Phone: [phone]   Fax:  [phone]  Physical Therapy Treatment  Patient Details  Name: Carolyn Sampson MRN: [id number] Date of Birth: [birth year] Referring Provider (PT): Tania Ade MD   Encounter Date: 10/04/2021   PT End of Session - 10/04/21 1324     Visit Number 5    Number of Visits 17    Date for PT Re-Evaluation 11/04/21    Authorization - Visit Number 5    Authorization - Number of Visits 10    Progress Note Due on Visit 10    PT Start Time 0093    PT Stop Time 1345    PT Time Calculation (min) 41 min    Activity Tolerance Patient tolerated treatment well    Behavior During Therapy [facility] for tasks assessed/performed             Past Medical History:  Diagnosis Date   Fatty liver    Pre-diabetes     Past Surgical History:  Procedure Laterality Date   ABDOMINAL HYSTERECTOMY     APPENDECTOMY     CESAREAN SECTION     CESAREAN SECTION     COLONOSCOPY WITH PROPOFOL N/A 06/02/2021   Procedure: COLONOSCOPY WITH PROPOFOL;  Surgeon: Lin Landsman, MD;  Location: [facility];  Service: Gastroenterology;  Laterality: N/A;  SPANISH INTERPRETER   LAPAROSCOPIC HYSTERECTOMY     LAPAROSCOPIC OOPHERECTOMY Right    SHOULDER ARTHROSCOPY WITH ROTATOR CUFF REPAIR AND SUBACROMIAL DECOMPRESSION Left 08/22/2021   Procedure: SHOULDER ARTHROSCOPY WITH ROTATOR CUFF REPAIR AND SUBACROMIAL DECOMPRESSION;  Surgeon: Tania Ade, MD;  Location: [facility];  Service: Orthopedics;  Laterality: Left;    There were no vitals filed for this visit.   Subjective Assessment - 10/04/21 1321     Subjective Patient reports 5/10 pain today, reports continued sensitive pain at incision that is getting better over time. She is using a heat pack at her UT that is helping with her pain.    Pertinent History Pt is a 61 year old female presenting s/p L  shoulder arthroscopy supraspinatus repair and subacromial decompression 08/22/21. Reports post surgical pain has been 9/10 since surgery. Is in a sling currently all hours of the day other than to shower. Has returned to surgeon 09/01/21 and got a good report. Has another appt scheduled for follow up in January. Reports incision has no drainage, but it is very senstivie to light touch. 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 In addition to full time work she reports walking 62mins 3-4x/week and she would like to return to this.  Pt reports she would like to return to her job after rehab but her boss has already told her she no longer has a job. Post surgical pain is as high as 9/10 with trunk rotation and trying to change clothes; and as low (and currently) 0/10. Pain is somewhat alleiviated by ice and pain medication. Patient lives with daughter who has been helping her though she works a lot (pt is alone often), has about 4 steps to enter 1 story home. Patient is currently not driving, daughter drives her to appts, her daughter makes meals and she is able to feed herself independently, is bathing modI ind standig in walkin shower soley with R hand, daughter fixing her hair for her, and dressing modI with no  one else assisting her physically but she needs to prop her arm with her other had and it is difficult. Sleeps in her bed on her back, with several pillows around her for comfort and to support her arm. Pt denies N/V, B&B changes, unexplained weight fluctuation, saddle paresthesia, fever, night sweats, or unrelenting night pain at this time.    Limitations Lifting;Reading;Writing;Walking;House hold activities    How long can you sit comfortably? unlimited    How long can you stand comfortably? unlimited    How long can you walk comfortably? unlimited    Diagnostic tests none post surgery    Patient Stated Goals restore motion/strength in  shoulder in order to complete normal household tasks    Pain Onset 1 to 4 weeks ago            Manual PROM all directions with consistent cuing for patient to "relax" shoulder with good compliance (approx 90d flex/abd, 45d ER/IR), in pain free range STM with trigger point release  to L UT and bicep muscle belly   Ther-Ex AROM elbow ext <> flex x15 in supine (elbow ext + wrist ext for bicep stretch Wrist flex <>ext x15 in supine Wrist ulnar <> radial deviation x12 in supine Seated scapular retractions x12 with good carry over of cuing to prevent elevation with good carry over Seated towel slides fwd flex and sitting perpendicular to table scaption x12 each direction; cuing to utilize trunk for assistance with understanding  PT reviewed the following HEP with patient with patient able to demonstrate a set of the following with min cuing for correction needed. PT educated patient on parameters of therex (how/when to inc/decrease intensity, frequency, rep/set range, stretch hold time, and purpose of therex) with verbalized understanding.  Access Code: [email or web address] Supine Elbow Flexion Extension AROM - 3 x daily - 7 x weekly - 12-20 reps Wrist Flexion Extension AROM - Palms Down - 3 x daily - 7 x weekly - 12-20 reps Wrist AROM Radial Ulnar Deviation - 3 x daily - 7 x weekly - 12-20 reps Seated Scapular Retraction - 3 x daily - 7 x weekly - 12-20 reps Seated Shoulder Flexion Towel Slide at Table Top - 3 x daily - 7 x weekly - 12 reps Seated Shoulder Scaption Slide at Table Top with Forearm in Neutral - 3 x daily - 7 x weekly - 12 reps                          PT Education - 10/04/21 1324     Education Details therex form/technique    Person(s) Educated Patient    Methods Explanation;Demonstration;Verbal cues    Comprehension Verbalized understanding;Returned demonstration;Verbal cues required              PT Short Term Goals - 09/08/21 1440       PT SHORT TERM  GOAL #1   Title Pt will be independent with HEP in order to improve strength and decrease pain in order to improve pain-free function at home and work.    Baseline 09/08/21 HEP given    Time 4    Period Weeks    Status New               PT Long Term Goals - 09/08/21 1444       PT LONG TERM GOAL #1   Title Patient will increase FOTO score to 44 to demonstrate predicted increase in functional mobility to complete  ADLs    Baseline 09/08/21 4    Time 12    Period Weeks      PT LONG TERM GOAL #2   Title Pt will decrease worst pain as reported on NPRS by at least 3 points in order to demonstrate clinically significant reduction in pain.    Baseline 09/08/21 9/10    Time 12    Period Weeks    Status New      PT LONG TERM GOAL #3   Title Pt will demonstrate full active L shoulder motion in order to complete self care ADLS    Baseline 63/3/35 unable to attempt    Time 12    Period Weeks    Status New      PT LONG TERM GOAL #4   Title Patient will demonstrate gross L shoulder MMTs of 4+/5 in order to be able to complete heavy household tasks    Baseline 11/09/20 unable to assess    Time 12    Period Weeks    Status New                   Plan - 10/04/21 1343     Clinical Impression Statement PT continued manual techniques to reduce tension and increase PROM with success, with progression of supporting joint AROM and table slide AAROM with success. PT updated HEP to reflect progression for progress at 6 weeks and exercises in session today with patient demonstrating and verbalizing understanding of HEP exercises. PT educated patient to cease exercises if pain increases with understanding. PT will continue progression as able.    Personal Factors and Comorbidities Age;Comorbidity 1;Fitness;Past/Current Experience;Time since onset of injury/illness/exacerbation    Comorbidities pre-diabetes    Examination-Activity Limitations Carry;Lift;Reach Overhead;Sleep     Examination-Participation Restrictions Occupation;Driving;Community Activity;Cleaning;Laundry;Meal Prep    Stability/Clinical Decision Making Evolving/Moderate complexity    Clinical Decision Making Moderate    Rehab Potential Good    PT Frequency 2x / week    PT Duration 12 weeks    PT Treatment/Interventions ADLs/Self Care Home Management;Aquatic Therapy;Electrical Stimulation;Moist Heat;Ultrasound;DME Instruction;Functional mobility training;Therapeutic activities;Therapeutic exercise;Neuromuscular re-education;Patient/family education;Manual techniques;Passive range of motion;Energy conservation;Taping;Vasopneumatic Device;Joint Manipulations;Cryotherapy;Traction;Dry needling    PT Next Visit Plan PROM, scap clocks, HEP    PT Home Exercise Plan scapular retractions, elbow motion, touch to incision site hibituation    Consulted and Agree with Plan of Care Patient             Patient will benefit from skilled therapeutic intervention in order to improve the following deficits and impairments:  Decreased range of motion, Improper body mechanics, Decreased activity tolerance, Decreased strength, Impaired UE functional use, Decreased mobility, Difficulty walking, Decreased endurance, Postural dysfunction, Increased muscle spasms, Impaired flexibility, Impaired tone, Hypomobility, Increased fascial restricitons, Pain  Visit Diagnosis: Acute pain of left shoulder     Problem List Patient Active Problem List   Diagnosis Date Noted   Screen for colon cancer    Muscle cramps 05/28/2018   Pre-diabetes 05/28/2018   Fatty liver 05/28/2018   Hair loss 05/28/2018   Sciatica 11/01/2015   S/P right oophorectomy 11/01/2015   S/P hysterectomy 11/01/2015   Durwin Reges DPT Durwin Reges, PT 10/04/2021, 1:47 PM  [facility] [address], [place], [address] Phone: [phone]   Fax:  [phone]  Name: Carolyn Sampson MRN: [id number] Date of Birth: 1960/12/28

## 2021-10-05 DIAGNOSIS — M25512 Pain in left shoulder: Secondary | ICD-10-CM | POA: Diagnosis present

## 2021-10-06 ENCOUNTER — Ambulatory Visit: Payer: PRIVATE HEALTH INSURANCE | Admitting: Physical Therapy

## 2021-10-11 ENCOUNTER — Ambulatory Visit: Payer: PRIVATE HEALTH INSURANCE | Admitting: Physical Therapy

## 2021-10-13 ENCOUNTER — Encounter: Payer: Self-pay | Admitting: Physical Therapy

## 2021-10-13 ENCOUNTER — Ambulatory Visit: Payer: PRIVATE HEALTH INSURANCE | Attending: Orthopedic Surgery | Admitting: Physical Therapy

## 2021-10-13 DIAGNOSIS — M6281 Muscle weakness (generalized): Secondary | ICD-10-CM | POA: Diagnosis present

## 2021-10-13 DIAGNOSIS — M25512 Pain in left shoulder: Secondary | ICD-10-CM | POA: Diagnosis present

## 2021-10-13 NOTE — Therapy (Signed)
[facility] [address], [place], [address] Phone: [phone]   Fax:  [phone]  Physical Therapy Treatment  Patient Details  Name: Carolyn Sampson MRN: [id number] Date of Birth: 01-07-1961 Referring Provider (PT): Tania Ade MD   Encounter Date: 10/13/2021   PT End of Session - 10/13/21 1321     Visit Number 6    Number of Visits 17    Date for PT Re-Evaluation 11/04/21    Authorization - Visit Number 6    Authorization - Number of Visits 10    PT Start Time 6283    PT Stop Time 1345    PT Time Calculation (min) 42 min    Activity Tolerance Patient tolerated treatment well    Behavior During Therapy [facility] for tasks assessed/performed             Past Medical History:  Diagnosis Date   Fatty liver    Pre-diabetes     Past Surgical History:  Procedure Laterality Date   ABDOMINAL HYSTERECTOMY     APPENDECTOMY     CESAREAN SECTION     CESAREAN SECTION     COLONOSCOPY WITH PROPOFOL N/A 06/02/2021   Procedure: COLONOSCOPY WITH PROPOFOL;  Surgeon: Lin Landsman, MD;  Location: [facility];  Service: Gastroenterology;  Laterality: N/A;  SPANISH INTERPRETER   LAPAROSCOPIC HYSTERECTOMY     LAPAROSCOPIC OOPHERECTOMY Right    SHOULDER ARTHROSCOPY WITH ROTATOR CUFF REPAIR AND SUBACROMIAL DECOMPRESSION Left 08/22/2021   Procedure: SHOULDER ARTHROSCOPY WITH ROTATOR CUFF REPAIR AND SUBACROMIAL DECOMPRESSION;  Surgeon: Tania Ade, MD;  Location: [facility];  Service: Orthopedics;  Laterality: Left;    There were no vitals filed for this visit.   Subjective Assessment - 10/13/21 1306     Subjective Patient reports good report from surgeon this week, is released from sling and to lift no more than 5# and to have no movements with excessive pain. Reports 1/10 pain today. Reports surgeon moved her arm around and it was painful, but that he said her heling was good. Still  having some sensitivty at scar.    Patient is accompained by: Interpreter    Pertinent History Pt is a 61 year old female presenting s/p L shoulder arthroscopy supraspinatus repair and subacromial decompression 08/22/21. Reports post surgical pain has been 9/10 since surgery. Is in a sling currently all hours of the day other than to shower. Has returned to surgeon 09/01/21 and got a good report. Has another appt scheduled for follow up in January. Reports incision has no drainage, but it is very senstivie to light touch. 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 In addition to full time work she reports walking 54mins 3-4x/week and she would like to return to this.  Pt reports she would like to return to her job after rehab but her boss has already told her she no longer has a job. Post surgical pain is as high as 9/10 with trunk rotation and trying to change clothes; and as low (and currently) 0/10. Pain is somewhat alleiviated by ice and pain medication. Patient lives with daughter who has been helping her though she works a lot (pt is alone often), has about 4 steps to enter 1 story home. Patient is currently not driving, daughter drives her to appts, her daughter makes meals and she is able to feed herself independently, is  bathing modI ind standig in walkin shower soley with R hand, daughter fixing her hair for her, and dressing modI with no one else assisting her physically but she needs to prop her arm with her other had and it is difficult. Sleeps in her bed on her back, with several pillows around her for comfort and to support her arm. Pt denies N/V, B&B changes, unexplained weight fluctuation, saddle paresthesia, fever, night sweats, or unrelenting night pain at this time.    Limitations Lifting;Reading;Writing;Walking;House hold activities    How long can you sit comfortably? unlimited    How long can you stand comfortably?  unlimited    How long can you walk comfortably? unlimited    Diagnostic tests none post surgery    Patient Stated Goals restore motion/strength in shoulder in order to complete normal household tasks    Pain Onset 1 to 4 weeks ago             Manual PROM all directions with consistent cuing for patient to "relax" shoulder with good compliance (approx 90d flex/abd, 45d ER/IR), in pain free range STM with trigger point release  to L UT and bicep muscle belly  Ther-Ex Supine AAROM flex with dowel 2x 8 with max cuing initially for AAROM with good carry over following Supine AAROM abd with dowel 2x 8 with cuing for limited pain range with good carry over Short lever flex 2 x6 with cuing for eccentric control Short lever abd 2 x6 with cuing for eccentric control 2# Bicep curl 2x 10/8 with min cuing for eccentric control with good carry over                          PT Education - 10/13/21 1317     Education Details therex form/technique    Person(s) Educated Patient    Methods Explanation;Verbal cues;Demonstration    Comprehension Verbalized understanding;Returned demonstration;Verbal cues required              PT Short Term Goals - 09/08/21 1440       PT SHORT TERM GOAL #1   Title Pt will be independent with HEP in order to improve strength and decrease pain in order to improve pain-free function at home and work.    Baseline 09/08/21 HEP given    Time 4    Period Weeks    Status New               PT Long Term Goals - 09/08/21 1444       PT LONG TERM GOAL #1   Title Patient will increase FOTO score to 44 to demonstrate predicted increase in functional mobility to complete ADLs    Baseline 09/08/21 4    Time 12    Period Weeks      PT LONG TERM GOAL #2   Title Pt will decrease worst pain as reported on NPRS by at least 3 points in order to demonstrate clinically significant reduction in pain.    Baseline 09/08/21 9/10    Time 12     Period Weeks    Status New      PT LONG TERM GOAL #3   Title Pt will demonstrate full active L shoulder motion in order to complete self care ADLS    Baseline 40/9/81 unable to attempt    Time 12    Period Weeks    Status New      PT LONG TERM GOAL #  4   Title Patient will demonstrate gross L shoulder MMTs of 4+/5 in order to be able to complete heavy household tasks    Baseline 11/09/20 unable to assess    Time 12    Period Weeks    Status New                   Plan - 10/13/21 1337     Clinical Impression Statement PT continued manual techniques for increased mobility and decreased muscle tension, with progression mobility with introduction of AAROM and short lever AROM with success. patient requires cuing for proper technique of all therex, and for completing exercise without pain exceeding 5/10. PT updated HEP to reflect AAROM dowel exercises and introduction of AROM with patient demonstrating and verbalizing understanding of parameters of HEP without pain exceeding 5/10. Pt with pain with all AAROM and AROM exercises. PT will continue progression as able.    Personal Factors and Comorbidities Age;Comorbidity 1;Fitness;Past/Current Experience;Time since onset of injury/illness/exacerbation    Comorbidities pre-diabetes    Examination-Activity Limitations Carry;Lift;Reach Overhead;Sleep    Examination-Participation Restrictions Occupation;Driving;Community Activity;Cleaning;Laundry;Meal Prep    Stability/Clinical Decision Making Evolving/Moderate complexity    Clinical Decision Making Moderate    Rehab Potential Good    PT Frequency 2x / week    PT Duration 12 weeks    PT Treatment/Interventions ADLs/Self Care Home Management;Aquatic Therapy;Electrical Stimulation;Moist Heat;Ultrasound;DME Instruction;Functional mobility training;Therapeutic activities;Therapeutic exercise;Neuromuscular re-education;Patient/family education;Manual techniques;Passive range of motion;Energy  conservation;Taping;Vasopneumatic Device;Joint Manipulations;Cryotherapy;Traction;Dry needling    PT Next Visit Plan AAROM progression- continued AROM progression; isometrics?    PT Home Exercise Plan supine AAROM flex/abd/ER with broom; AROM short lever flex and abd to tolerance    Consulted and Agree with Plan of Care Patient             Patient will benefit from skilled therapeutic intervention in order to improve the following deficits and impairments:  Decreased range of motion, Improper body mechanics, Decreased activity tolerance, Decreased strength, Impaired UE functional use, Decreased mobility, Difficulty walking, Decreased endurance, Postural dysfunction, Increased muscle spasms, Impaired flexibility, Impaired tone, Hypomobility, Increased fascial restricitons, Pain  Visit Diagnosis: Acute pain of left shoulder  Muscle weakness (generalized)     Problem List Patient Active Problem List   Diagnosis Date Noted   Screen for colon cancer    Muscle cramps 05/28/2018   Pre-diabetes 05/28/2018   Fatty liver 05/28/2018   Hair loss 05/28/2018   Sciatica 11/01/2015   S/P right oophorectomy 11/01/2015   S/P hysterectomy 11/01/2015   Durwin Reges DPT Durwin Reges, PT 10/13/2021, 1:48 PM  [facility] [address], [place], [address] Phone: [phone]   Fax:  [phone]  Name: Stevana Dufner MRN: [id number] Date of Birth: 1961/04/13

## 2021-10-18 ENCOUNTER — Ambulatory Visit: Payer: PRIVATE HEALTH INSURANCE | Admitting: Physical Therapy

## 2021-10-18 ENCOUNTER — Encounter: Payer: Self-pay | Admitting: Physical Therapy

## 2021-10-18 DIAGNOSIS — M25512 Pain in left shoulder: Secondary | ICD-10-CM

## 2021-10-18 DIAGNOSIS — M6281 Muscle weakness (generalized): Secondary | ICD-10-CM

## 2021-10-18 NOTE — Therapy (Signed)
[facility] [address] [place], [address] Phone: [phone]   Fax:  [phone]  Physical Therapy Treatment  Patient Details  Name: Carolyn Sampson MRN: [id number] Date of Birth: 03/08/1961 Referring Provider (PT): Tania Ade MD   Encounter Date: 10/18/2021   PT End of Session - 10/18/21 1320     Visit Number 7    Number of Visits 17    Date for PT Re-Evaluation 11/04/21    Authorization Type WC    Authorization - Visit Number 7    Authorization - Number of Visits 10    Progress Note Due on Visit 10    PT Start Time 1856    PT Stop Time 1345    PT Time Calculation (min) 41 min    Activity Tolerance Patient tolerated treatment well    Behavior During Therapy [facility] for tasks assessed/performed             Past Medical History:  Diagnosis Date   Fatty liver    Pre-diabetes     Past Surgical History:  Procedure Laterality Date   ABDOMINAL HYSTERECTOMY     APPENDECTOMY     CESAREAN SECTION     CESAREAN SECTION     COLONOSCOPY WITH PROPOFOL N/A 06/02/2021   Procedure: COLONOSCOPY WITH PROPOFOL;  Surgeon: Lin Landsman, MD;  Location: [facility];  Service: Gastroenterology;  Laterality: N/A;  SPANISH INTERPRETER   LAPAROSCOPIC HYSTERECTOMY     LAPAROSCOPIC OOPHERECTOMY Right    SHOULDER ARTHROSCOPY WITH ROTATOR CUFF REPAIR AND SUBACROMIAL DECOMPRESSION Left 08/22/2021   Procedure: SHOULDER ARTHROSCOPY WITH ROTATOR CUFF REPAIR AND SUBACROMIAL DECOMPRESSION;  Surgeon: Tania Ade, MD;  Location: [place];  Service: Orthopedics;  Laterality: Left;    There were no vitals filed for this visit.   Subjective Assessment - 10/18/21 1308     Subjective Patient reports some back pain with new HEP exercises, but is getting more range with AAROM dowel than at last session. Reports 7/10 pain today down L scapula. Completing HEP regularly    Pertinent History Pt is a 61  year old female presenting s/p L shoulder arthroscopy supraspinatus repair and subacromial decompression 08/22/21. Reports post surgical pain has been 9/10 since surgery. Is in a sling currently all hours of the day other than to shower. Has returned to surgeon 09/01/21 and got a good report. Has another appt scheduled for follow up in January. Reports incision has no drainage, but it is very senstivie to light touch. 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 In addition to full time work she reports walking 14mins 3-4x/week and she would like to return to this.  Pt reports she would like to return to her job after rehab but her boss has already told her she no longer has a job. Post surgical pain is as high as 9/10 with trunk rotation and trying to change clothes; and as low (and currently) 0/10. Pain is somewhat alleiviated by ice and pain medication. Patient lives with daughter who has been helping her though she works a lot (pt is alone often), has about 4 steps to enter 1 story home. Patient is currently not driving, daughter drives her to appts, her daughter makes meals and she is able to feed herself independently, is bathing modI ind standig in walkin shower soley with R hand, daughter fixing her hair for her,  and dressing modI with no one else assisting her physically but she needs to prop her arm with her other had and it is difficult. Sleeps in her bed on her back, with several pillows around her for comfort and to support her arm. Pt denies N/V, B&B changes, unexplained weight fluctuation, saddle paresthesia, fever, night sweats, or unrelenting night pain at this time.    Limitations Lifting;Reading;Writing;Walking;House hold activities    How long can you sit comfortably? unlimited    How long can you stand comfortably? unlimited    How long can you walk comfortably? unlimited    Diagnostic tests none post surgery    Patient Stated  Goals restore motion/strength in shoulder in order to complete normal household tasks    Pain Onset 1 to 4 weeks ago               Manual PROM all directions with consistent cuing for patient to "relax" shoulder with good compliance (approx 90d flex/abd, 45d ER/IR), in pain free range STM with trigger point release  to L UT and bicep muscle belly   Ther-Ex Supine AAROM flex with dowel x12 with max cuing initially for AAROM with good carry over following Supine AAROM abd with dowel x12 with cuing for limited pain range with good carry over Seated abd aarom with slide on stair handrail x12 with cuing for trunk lean assist with good carry over Attempted above for flex with ease- ceased  Standing aarom flex with towel on wall x12 approx 130d flex with this Short lever flex 2 x8 with cuing for eccentric control Short lever abd 2 x8/6 with cuing for eccentric control Seated rows RTB x10; GTB x10 with good carry over of cuing for technique                           PT Education - 10/18/21 1318     Education Details therex form/technique    Person(s) Educated Patient    Methods Explanation;Demonstration;Tactile cues;Verbal cues    Comprehension Verbalized understanding;Returned demonstration;Verbal cues required              PT Short Term Goals - 09/08/21 1440       PT SHORT TERM GOAL #1   Title Pt will be independent with HEP in order to improve strength and decrease pain in order to improve pain-free function at home and work.    Baseline 09/08/21 HEP given    Time 4    Period Weeks    Status New               PT Long Term Goals - 09/08/21 1444       PT LONG TERM GOAL #1   Title Patient will increase FOTO score to 44 to demonstrate predicted increase in functional mobility to complete ADLs    Baseline 09/08/21 4    Time 12    Period Weeks      PT LONG TERM GOAL #2   Title Pt will decrease worst pain as reported on NPRS by at least 3  points in order to demonstrate clinically significant reduction in pain.    Baseline 09/08/21 9/10    Time 12    Period Weeks    Status New      PT LONG TERM GOAL #3   Title Pt will demonstrate full active L shoulder motion in order to complete self care ADLS    Baseline 78/4/69 unable to  attempt    Time 12    Period Weeks    Status New      PT LONG TERM GOAL #4   Title Patient will demonstrate gross L shoulder MMTs of 4+/5 in order to be able to complete heavy household tasks    Baseline 11/09/20 unable to assess    Time 12    Period Weeks    Status New                   Plan - 10/18/21 1337     Clinical Impression Statement PT continued to utilize manual techniques and therex for increased mobility with success. patient demonstrates increased AAROM this session, with some increased AROM, but difficulty sequencing with scapulohumeral rhythm without shoulder hiking compensation. Patient is motivated throughout session with good carry over of cuing for therex. PT will continue progression as able.    Personal Factors and Comorbidities Age;Comorbidity 1;Fitness;Past/Current Experience;Time since onset of injury/illness/exacerbation    Comorbidities pre-diabetes    Examination-Activity Limitations Carry;Lift;Reach Overhead;Sleep    Examination-Participation Restrictions Occupation;Driving;Community Activity;Cleaning;Laundry;Meal Prep    Stability/Clinical Decision Making Evolving/Moderate complexity    Clinical Decision Making Moderate    Rehab Potential Good    PT Frequency 2x / week    PT Duration 12 weeks    PT Treatment/Interventions ADLs/Self Care Home Management;Aquatic Therapy;Electrical Stimulation;Moist Heat;Ultrasound;DME Instruction;Functional mobility training;Therapeutic activities;Therapeutic exercise;Neuromuscular re-education;Patient/family education;Manual techniques;Passive range of motion;Energy conservation;Taping;Vasopneumatic Device;Joint  Manipulations;Cryotherapy;Traction;Dry needling    PT Next Visit Plan AAROM progression- continued AROM progression; isometrics?    PT Home Exercise Plan supine AAROM flex/abd/ER with broom; AROM short lever flex and abd to tolerance    Consulted and Agree with Plan of Care Patient             Patient will benefit from skilled therapeutic intervention in order to improve the following deficits and impairments:  Decreased range of motion, Improper body mechanics, Decreased activity tolerance, Decreased strength, Impaired UE functional use, Decreased mobility, Difficulty walking, Decreased endurance, Postural dysfunction, Increased muscle spasms, Impaired flexibility, Impaired tone, Hypomobility, Increased fascial restricitons, Pain  Visit Diagnosis: Acute pain of left shoulder  Muscle weakness (generalized)     Problem List Patient Active Problem List   Diagnosis Date Noted   Screen for colon cancer    Muscle cramps 05/28/2018   Pre-diabetes 05/28/2018   Fatty liver 05/28/2018   Hair loss 05/28/2018   Sciatica 11/01/2015   S/P right oophorectomy 11/01/2015   S/P hysterectomy 11/01/2015   Durwin Reges DPT Durwin Reges, PT 10/18/2021, 6:17 PM  [facility] [address], [place], [address] Phone: [phone]   Fax:  [phone]  Name: Carolyn Sampson MRN: [id number] Date of Birth: [birth year]

## 2021-10-20 ENCOUNTER — Encounter: Payer: 59 | Admitting: Physical Therapy

## 2021-10-20 ENCOUNTER — Ambulatory Visit: Payer: PRIVATE HEALTH INSURANCE | Attending: Orthopedic Surgery | Admitting: Physical Therapy

## 2021-10-20 DIAGNOSIS — M6281 Muscle weakness (generalized): Secondary | ICD-10-CM | POA: Insufficient documentation

## 2021-10-20 DIAGNOSIS — M25512 Pain in left shoulder: Secondary | ICD-10-CM | POA: Diagnosis present

## 2021-10-20 DIAGNOSIS — M25612 Stiffness of left shoulder, not elsewhere classified: Secondary | ICD-10-CM | POA: Insufficient documentation

## 2021-10-20 NOTE — Therapy (Signed)
[facility] [address], [place], [address] Phone: [phone]   Fax:  [phone]  Physical Therapy Treatment  Patient Details  Name: Carolyn Sampson MRN: [id number] Date of Birth: [birth year] Referring Provider (PT): Tania Ade MD   Encounter Date: 10/20/2021   PT End of Session - 10/20/21 1533     Visit Number 8    Number of Visits 17    Date for PT Re-Evaluation 11/04/21    Authorization - Visit Number 8    Authorization - Number of Visits 10    PT Start Time 1520    PT Stop Time 1600    PT Time Calculation (min) 40 min    Activity Tolerance Patient tolerated treatment well    Behavior During Therapy [facility] for tasks assessed/performed             Past Medical History:  Diagnosis Date   Fatty liver    Pre-diabetes     Past Surgical History:  Procedure Laterality Date   ABDOMINAL HYSTERECTOMY     APPENDECTOMY     CESAREAN SECTION     CESAREAN SECTION     COLONOSCOPY WITH PROPOFOL N/A 06/02/2021   Procedure: COLONOSCOPY WITH PROPOFOL;  Surgeon: Lin Landsman, MD;  Location: [facility];  Service: Gastroenterology;  Laterality: N/A;  SPANISH INTERPRETER   LAPAROSCOPIC HYSTERECTOMY     LAPAROSCOPIC OOPHERECTOMY Right    SHOULDER ARTHROSCOPY WITH ROTATOR CUFF REPAIR AND SUBACROMIAL DECOMPRESSION Left 08/22/2021   Procedure: SHOULDER ARTHROSCOPY WITH ROTATOR CUFF REPAIR AND SUBACROMIAL DECOMPRESSION;  Surgeon: Tania Ade, MD;  Location: [place];  Service: Orthopedics;  Laterality: Left;    There were no vitals filed for this visit.   Subjective Assessment - 10/20/21 1524     Subjective pnt reports 3/10 pain on NPS. pnt also reports completing HEP regularly; no concerns at this time    Pertinent History Pt is a 61 year old female presenting s/p L shoulder arthroscopy supraspinatus repair and subacromial decompression 08/22/21. Reports post surgical pain has  been 9/10 since surgery. Is in a sling currently all hours of the day other than to shower. Has returned to surgeon 09/01/21 and got a good report. Has another appt scheduled for follow up in January. Reports incision has no drainage, but it is very senstivie to light touch. 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 In addition to full time work she reports walking 38mins 3-4x/week and she would like to return to this.  Pt reports she would like to return to her job after rehab but her boss has already told her she no longer has a job. Post surgical pain is as high as 9/10 with trunk rotation and trying to change clothes; and as low (and currently) 0/10. Pain is somewhat alleiviated by ice and pain medication. Patient lives with daughter who has been helping her though she works a lot (pt is alone often), has about 4 steps to enter 1 story home. Patient is currently not driving, daughter drives her to appts, her daughter makes meals and she is able to feed herself independently, is bathing modI ind standig in walkin shower soley with R hand, daughter fixing her hair for her, and dressing modI with no one else assisting her physically but she needs to prop her arm with her other had and it is difficult. Sleeps in her bed  on her back, with several pillows around her for comfort and to support her arm. Pt denies N/V, B&B changes, unexplained weight fluctuation, saddle paresthesia, fever, night sweats, or unrelenting night pain at this time.    Limitations Lifting;Reading;Writing;Walking;House hold activities    How long can you sit comfortably? unlimited    How long can you stand comfortably? unlimited    How long can you walk comfortably? unlimited    Diagnostic tests none post surgery    Patient Stated Goals restore motion/strength in shoulder in order to complete normal household tasks    Pain Onset 1 to 4 weeks ago                  Manual PROM all directions with consistent cuing for patient to "relax" shoulder with good compliance (approx 90d flex/abd, 45d ER/IR), in pain free range STM with trigger point release  to L UT and bicep muscle belly   Ther-Ex Supine AAROM flex with dowel x12 with max cuing initially for AAROM with good carry over following Supine AAROM abd with dowel x12 with cuing for limited pain range with good carry over Standing aarom flex with towel on wall with 2 fwd steps following max AROM flex x12 approx 170d flex with this Long lever flex 2 x6/8 with cuing for eccentric control with difficulty with this for final reps Long lever abd 2 x8/6 with cuing for eccentric control  Bilat ER RTB 2x 6 with cuing for scapular retraction with good carry over Seated rows GTB x10 with good carry over of cuing for technique                         PT Education - 10/20/21 1532     Education Details therex form/technique    Person(s) Educated Patient    Methods Explanation;Demonstration;Verbal cues    Comprehension Verbalized understanding;Returned demonstration;Verbal cues required              PT Short Term Goals - 09/08/21 1440       PT SHORT TERM GOAL #1   Title Pt will be independent with HEP in order to improve strength and decrease pain in order to improve pain-free function at home and work.    Baseline 09/08/21 HEP given    Time 4    Period Weeks    Status New               PT Long Term Goals - 09/08/21 1444       PT LONG TERM GOAL #1   Title Patient will increase FOTO score to 44 to demonstrate predicted increase in functional mobility to complete ADLs    Baseline 09/08/21 4    Time 12    Period Weeks      PT LONG TERM GOAL #2   Title Pt will decrease worst pain as reported on NPRS by at least 3 points in order to demonstrate clinically significant reduction in pain.    Baseline 09/08/21 9/10    Time 12    Period Weeks    Status New      PT LONG  TERM GOAL #3   Title Pt will demonstrate full active L shoulder motion in order to complete self care ADLS    Baseline 62/1/30 unable to attempt    Time 12    Period Weeks    Status New      PT LONG TERM GOAL #4   Title  Patient will demonstrate gross L shoulder MMTs of 4+/5 in order to be able to complete heavy household tasks    Baseline 11/09/20 unable to assess    Time 12    Period Weeks    Status New                   Plan - 10/20/21 1538     Clinical Impression Statement PT continued therex progression for increased shoulder mobility and strengthening with success. Patient pain not exceeding 5/10 throughout session. Patient is able to comply with all cuing throughout session, demonstrating increased mobility in AAROM and AROM since last visit. Pt requires multimodal cuing for scapulohumeral rhythm, with difficulty preventing scapular elevation. PT will continue progression as able.    Personal Factors and Comorbidities Age;Comorbidity 1;Fitness;Past/Current Experience;Time since onset of injury/illness/exacerbation    Examination-Activity Limitations Carry;Lift;Reach Overhead;Sleep    Examination-Participation Restrictions Occupation;Driving;Community Activity;Cleaning;Laundry;Meal Prep    Stability/Clinical Decision Making Evolving/Moderate complexity    Clinical Decision Making Moderate    Rehab Potential Good    PT Frequency 2x / week    PT Duration 12 weeks    PT Treatment/Interventions ADLs/Self Care Home Management;Aquatic Therapy;Electrical Stimulation;Moist Heat;Ultrasound;DME Instruction;Functional mobility training;Therapeutic activities;Therapeutic exercise;Neuromuscular re-education;Patient/family education;Manual techniques;Passive range of motion;Energy conservation;Taping;Vasopneumatic Device;Joint Manipulations;Cryotherapy;Traction;Dry needling    PT Next Visit Plan AAROM progression- continued AROM progression; isometrics?    PT Home Exercise Plan supine  AAROM flex/abd/ER with broom; AROM short lever flex and abd to tolerance    Consulted and Agree with Plan of Care Patient             Patient will benefit from skilled therapeutic intervention in order to improve the following deficits and impairments:  Decreased range of motion, Improper body mechanics, Decreased activity tolerance, Decreased strength, Impaired UE functional use, Decreased mobility, Difficulty walking, Decreased endurance, Postural dysfunction, Increased muscle spasms, Impaired flexibility, Impaired tone, Hypomobility, Increased fascial restricitons, Pain  Visit Diagnosis: Acute pain of left shoulder  Muscle weakness (generalized)     Problem List Patient Active Problem List   Diagnosis Date Noted   Screen for colon cancer    Muscle cramps 05/28/2018   Pre-diabetes 05/28/2018   Fatty liver 05/28/2018   Hair loss 05/28/2018   Sciatica 11/01/2015   S/P right oophorectomy 11/01/2015   S/P hysterectomy 11/01/2015   Durwin Reges DPT Durwin Reges, PT 10/20/2021, 3:56 PM  [facility] [address], [place], [address] Phone: [phone]   Fax:  [phone]  Name: Rim Thatch MRN: [id number] Date of Birth: 20-May-1961

## 2021-10-25 ENCOUNTER — Encounter: Payer: 59 | Admitting: Physical Therapy

## 2021-10-25 ENCOUNTER — Ambulatory Visit: Payer: PRIVATE HEALTH INSURANCE | Admitting: Physical Therapy

## 2021-10-25 ENCOUNTER — Other Ambulatory Visit: Payer: Self-pay

## 2021-10-25 ENCOUNTER — Encounter: Payer: Self-pay | Admitting: Physical Therapy

## 2021-10-25 DIAGNOSIS — M25612 Stiffness of left shoulder, not elsewhere classified: Secondary | ICD-10-CM

## 2021-10-25 DIAGNOSIS — M6281 Muscle weakness (generalized): Secondary | ICD-10-CM

## 2021-10-25 DIAGNOSIS — M25512 Pain in left shoulder: Secondary | ICD-10-CM | POA: Diagnosis not present

## 2021-10-25 NOTE — Therapy (Signed)
[facility] [address], [place], [address] Phone: [phone]   Fax:  [phone]  Physical Therapy Treatment  Patient Details  Name: Carolyn Sampson MRN: [id number] Date of Birth: 1960/11/23 Referring Provider (PT): Tania Ade MD   Encounter Date: 10/25/2021   PT End of Session - 10/25/21 1654     Visit Number 9    Number of Visits 17    Date for PT Re-Evaluation 11/04/21    Authorization Type WC    Authorization - Visit Number 9    Authorization - Number of Visits 10    Progress Note Due on Visit 10    PT Start Time 7124    PT Stop Time 1430    PT Time Calculation (min) 45 min    Activity Tolerance Patient tolerated treatment well    Behavior During Therapy [facility] for tasks assessed/performed             Past Medical History:  Diagnosis Date   Fatty liver    Pre-diabetes     Past Surgical History:  Procedure Laterality Date   ABDOMINAL HYSTERECTOMY     APPENDECTOMY     CESAREAN SECTION     CESAREAN SECTION     COLONOSCOPY WITH PROPOFOL N/A 06/02/2021   Procedure: COLONOSCOPY WITH PROPOFOL;  Surgeon: Lin Landsman, MD;  Location: [facility];  Service: Gastroenterology;  Laterality: N/A;  SPANISH INTERPRETER   LAPAROSCOPIC HYSTERECTOMY     LAPAROSCOPIC OOPHERECTOMY Right    SHOULDER ARTHROSCOPY WITH ROTATOR CUFF REPAIR AND SUBACROMIAL DECOMPRESSION Left 08/22/2021   Procedure: SHOULDER ARTHROSCOPY WITH ROTATOR CUFF REPAIR AND SUBACROMIAL DECOMPRESSION;  Surgeon: Tania Ade, MD;  Location: ;  Service: Orthopedics;  Laterality: Left;    There were no vitals filed for this visit.   Subjective Assessment - 10/25/21 1345     Subjective pnt reports feeling better and pain being more controlled by pills. pnt reports 1/10. pnt reports completing HEP regularly                 Manual PROM all directions with consistent cuing for patient to  "relax" shoulder with good compliance (approx 100d flex, 90 abd, 50d ER/ 45 IR), in pain free range STM with trigger point release  to L UT and bicep muscle belly for 15 min   Ther-Ex Supine AAROM flex with dowel x12 with min cuing initially for AAROM  Supine AAROM abd with dowel x12 with cuing for limited pain range with good carry over Standing bilateral wall slide with towel x12 Standing scaption against the wall 2 x8  with cueing for neutral posture Standing abduction against the wall 2 x 6 with mod cueing for maintaining posture Pendulums 2 x 12  Seated rows GTB 3 x 8                           PT Education - 10/25/21 1653     Education Details therex form/ technique, HEP    Person(s) Educated Patient    Methods Explanation;Demonstration;Verbal cues    Comprehension Verbalized understanding;Returned demonstration              PT Short Term Goals - 09/08/21 1440       PT SHORT TERM GOAL #1   Title Pt will be independent with HEP in order to improve strength and decrease pain in order to improve pain-free function at home  and work.    Baseline 09/08/21 HEP given    Time 4    Period Weeks    Status New               PT Long Term Goals - 09/08/21 1444       PT LONG TERM GOAL #1   Title Patient will increase FOTO score to 44 to demonstrate predicted increase in functional mobility to complete ADLs    Baseline 09/08/21 4    Time 12    Period Weeks      PT LONG TERM GOAL #2   Title Pt will decrease worst pain as reported on NPRS by at least 3 points in order to demonstrate clinically significant reduction in pain.    Baseline 09/08/21 9/10    Time 12    Period Weeks    Status New      PT LONG TERM GOAL #3   Title Pt will demonstrate full active L shoulder motion in order to complete self care ADLS    Baseline 62/8/36 unable to attempt    Time 12    Period Weeks    Status New      PT LONG TERM GOAL #4   Title Patient will demonstrate  gross L shoulder MMTs of 4+/5 in order to be able to complete heavy household tasks    Baseline 11/09/20 unable to assess    Time 12    Period Weeks    Status New                   Plan - 10/25/21 1655     Clinical Impression Statement PT continued therex progression for increased shoulder mobility and parascapular strength. pnt able to comply with min cueing throughout session and demonstrated an increase in shoulder flexion, abduction, ER, and IR AROM. pnt demonstrated verbal understanding of SPT direction and maintained good motivation throughout session. Pnt tolerated STM with trigger point release well. PT will continue progression as able    Personal Factors and Comorbidities Age;Comorbidity 1;Fitness;Past/Current Experience;Time since onset of injury/illness/exacerbation    Comorbidities pre-diabetes    Examination-Activity Limitations Carry;Lift;Reach Overhead;Sleep    Examination-Participation Restrictions Occupation;Driving;Community Activity;Cleaning;Laundry;Meal Prep    Stability/Clinical Decision Making Evolving/Moderate complexity    Clinical Decision Making Moderate    Rehab Potential Good    PT Frequency 2x / week    PT Duration 12 weeks    PT Treatment/Interventions ADLs/Self Care Home Management;Aquatic Therapy;Electrical Stimulation;Moist Heat;Ultrasound;DME Instruction;Functional mobility training;Therapeutic activities;Therapeutic exercise;Neuromuscular re-education;Patient/family education;Manual techniques;Passive range of motion;Energy conservation;Taping;Vasopneumatic Device;Joint Manipulations;Cryotherapy;Traction;Dry needling    PT Next Visit Plan AAROM progression- continued AROM progression; parascapular strengthening    PT Home Exercise Plan seated scapular retractions and bilateral ER    Consulted and Agree with Plan of Care Patient             Patient will benefit from skilled therapeutic intervention in order to improve the following deficits and  impairments:  Decreased range of motion, Improper body mechanics, Decreased activity tolerance, Decreased strength, Impaired UE functional use, Decreased mobility, Difficulty walking, Decreased endurance, Postural dysfunction, Increased muscle spasms, Impaired flexibility, Impaired tone, Hypomobility, Increased fascial restricitons, Pain  Visit Diagnosis: Acute pain of left shoulder  Muscle weakness (generalized)  Stiffness of left shoulder, not elsewhere classified     Problem List Patient Active Problem List   Diagnosis Date Noted   Screen for colon cancer    Muscle cramps 05/28/2018   Pre-diabetes 05/28/2018   Fatty liver  05/28/2018   Hair loss 05/28/2018   Sciatica 11/01/2015   S/P right oophorectomy 11/01/2015   S/P hysterectomy 11/01/2015     Durwin Reges DPT Claiborne Billings O'Daniel, SPT Durwin Reges, PT 10/26/2021, 8:29 AM  [facility] [address] [place], [address] Phone: [phone]   Fax:  [phone]  Name: Carolyn Sampson MRN: [id number] Date of Birth: 14-Nov-1960

## 2021-10-27 ENCOUNTER — Encounter: Payer: 59 | Admitting: Physical Therapy

## 2021-10-27 ENCOUNTER — Encounter: Payer: Self-pay | Admitting: Physical Therapy

## 2021-10-27 ENCOUNTER — Ambulatory Visit: Payer: PRIVATE HEALTH INSURANCE | Admitting: Physical Therapy

## 2021-10-27 DIAGNOSIS — M25512 Pain in left shoulder: Secondary | ICD-10-CM | POA: Diagnosis not present

## 2021-10-27 DIAGNOSIS — M6281 Muscle weakness (generalized): Secondary | ICD-10-CM

## 2021-10-27 DIAGNOSIS — M25612 Stiffness of left shoulder, not elsewhere classified: Secondary | ICD-10-CM

## 2021-10-27 NOTE — Therapy (Signed)
[facility] [address], [place], [address] Phone: [phone]   Fax:  [phone]  Physical Therapy Treatment  Patient Details  Name: Carolyn Sampson MRN: [id number] Date of Birth: 05-08-1961 Referring Provider (PT): Tania Ade MD   Encounter Date: 10/27/2021   PT End of Session - 10/27/21 1438     Visit Number 10    Number of Visits 17    Authorization Type WC    Authorization - Visit Number 10    Authorization - Number of Visits 10    Progress Note Due on Visit 10    PT Start Time 5681    PT Stop Time 1430    PT Time Calculation (min) 45 min    Activity Tolerance Patient tolerated treatment well    Behavior During Therapy WFL for tasks assessed/performed             Past Medical History:  Diagnosis Date   Fatty liver    Pre-diabetes     Past Surgical History:  Procedure Laterality Date   ABDOMINAL HYSTERECTOMY     APPENDECTOMY     CESAREAN SECTION     CESAREAN SECTION     COLONOSCOPY WITH PROPOFOL N/A 06/02/2021   Procedure: COLONOSCOPY WITH PROPOFOL;  Surgeon: Lin Landsman, MD;  Location: [facility];  Service: Gastroenterology;  Laterality: N/A;  SPANISH INTERPRETER   LAPAROSCOPIC HYSTERECTOMY     LAPAROSCOPIC OOPHERECTOMY Right    SHOULDER ARTHROSCOPY WITH ROTATOR CUFF REPAIR AND SUBACROMIAL DECOMPRESSION Left 08/22/2021   Procedure: SHOULDER ARTHROSCOPY WITH ROTATOR CUFF REPAIR AND SUBACROMIAL DECOMPRESSION;  Surgeon: Tania Ade, MD;  Location: [facility];  Service: Orthopedics;  Laterality: Left;    There were no vitals filed for this visit.   Subjective Assessment - 10/27/21 1347     Subjective pnt reports feeling very stiff on tuesday from the exercises. pnt reports 3/10 pain on NPS    Patient is accompained by: Interpreter    Pertinent History Pt is a 61 year old female presenting s/p L shoulder arthroscopy supraspinatus repair and  subacromial decompression 08/22/21. Reports post surgical pain has been 9/10 since surgery. Is in a sling currently all hours of the day other than to shower. Has returned to surgeon 09/01/21 and got a good report. Has another appt scheduled for follow up in January. Reports incision has no drainage, but it is very senstivie to light touch. 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 In addition to full time work she reports walking 9mins 3-4x/week and she would like to return to this.  Pt reports she would like to return to her job after rehab but her boss has already told her she no longer has a job. Post surgical pain is as high as 9/10 with trunk rotation and trying to change clothes; and as low (and currently) 0/10. Pain is somewhat alleiviated by ice and pain medication. Patient lives with daughter who has been helping her though she works a lot (pt is alone often), has about 4 steps to enter 1 story home. Patient is currently not driving, daughter drives her to appts, her daughter makes meals and she is able to feed herself independently, is bathing modI ind standig in walkin shower soley with R hand, daughter fixing her hair for her, and dressing modI with no one else assisting her physically but she needs to prop  her arm with her other had and it is difficult. Sleeps in her bed on her back, with several pillows around her for comfort and to support her arm. Pt denies N/V, B&B changes, unexplained weight fluctuation, saddle paresthesia, fever, night sweats, or unrelenting night pain at this time.    Limitations Lifting;Reading;Writing;Walking;House hold activities    How long can you sit comfortably? unlimited    How long can you stand comfortably? unlimited    How long can you walk comfortably? unlimited    Diagnostic tests none post surgery    Patient Stated Goals restore motion/strength in shoulder in order to complete normal  household tasks    Currently in Pain? Yes    Pain Score 3     Pain Location Shoulder    Pain Orientation Left    Pain Descriptors / Indicators Tingling;Sharp    Pain Type Surgical pain    Pain Radiating Towards none    Pain Onset 1 to 4 weeks ago    Pain Frequency Intermittent                  Manual PROM all directions with consistent cuing for patient to "relax" shoulder with good compliance (approx 110d flex, 95 abd, 50d ER/ 45 IR), in pain free range STM with trigger point release  to L UT for 15 min   Ther-Ex Supine AAROM flex with dowel x12 with min cuing initially for AAROM  Supine AROM with mod assist for the first 20 degrees of flexion 3 x 6 Supine AROM abduction with min assistance for first 10 degrees of motion 2 x6 Pendulums 3 x 12  Standing bilateral wall slide with pillowcase 2 x 6 with min tactile cueing for relaxing UT Seated rows GTB 3 x 8                       PT Education - 10/27/21 1438     Education Details therex form/ technique    Person(s) Educated Patient    Methods Explanation;Demonstration;Verbal cues;Tactile cues    Comprehension Verbalized understanding;Returned demonstration              PT Short Term Goals - 09/08/21 1440       PT SHORT TERM GOAL #1   Title Pt will be independent with HEP in order to improve strength and decrease pain in order to improve pain-free function at home and work.    Baseline 09/08/21 HEP given    Time 4    Period Weeks    Status New               PT Long Term Goals - 09/08/21 1444       PT LONG TERM GOAL #1   Title Patient will increase FOTO score to 44 to demonstrate predicted increase in functional mobility to complete ADLs    Baseline 09/08/21 4    Time 12    Period Weeks      PT LONG TERM GOAL #2   Title Pt will decrease worst pain as reported on NPRS by at least 3 points in order to demonstrate clinically significant reduction in pain.    Baseline 09/08/21 9/10     Time 12    Period Weeks    Status New      PT LONG TERM GOAL #3   Title Pt will demonstrate full active L shoulder motion in order to complete self care ADLS    Baseline 40/1/02  unable to attempt    Time 12    Period Weeks    Status New      PT LONG TERM GOAL #4   Title Patient will demonstrate gross L shoulder MMTs of 4+/5 in order to be able to complete heavy household tasks    Baseline 11/09/20 unable to assess    Time 12    Period Weeks    Status New                   Plan - 10/27/21 1439     Clinical Impression Statement PT continued therex progression for increased AROM shoulder mobility and parascapular strength. pnt able to comply with min cueing throughout session and demonstrated an increase in shoulder flexion, abduction, ER and IR AROM. pnt demonstrated verbal understanding of SPT direction and maintained good motivation throughout session. pnt tolereated STOM with TP release well. PT will continue progression as able    Personal Factors and Comorbidities Age;Comorbidity 1;Fitness;Past/Current Experience;Time since onset of injury/illness/exacerbation    Comorbidities pre-diabetes    Examination-Activity Limitations Carry;Lift;Reach Overhead;Sleep    Examination-Participation Restrictions Occupation;Driving;Community Activity;Cleaning;Laundry;Meal Prep    Stability/Clinical Decision Making Evolving/Moderate complexity    Clinical Decision Making Moderate    Rehab Potential Good    PT Frequency 2x / week    PT Duration 12 weeks    PT Treatment/Interventions ADLs/Self Care Home Management;Aquatic Therapy;Electrical Stimulation;Moist Heat;Ultrasound;DME Instruction;Functional mobility training;Therapeutic activities;Therapeutic exercise;Neuromuscular re-education;Patient/family education;Manual techniques;Passive range of motion;Energy conservation;Taping;Vasopneumatic Device;Joint Manipulations;Cryotherapy;Traction;Dry needling    PT Next Visit Plan AAROM  progression- continued AROM progression; parascapular strengthening    PT Home Exercise Plan seated scapular retractions and bilateral ER    Consulted and Agree with Plan of Care Patient             Patient will benefit from skilled therapeutic intervention in order to improve the following deficits and impairments:  Decreased range of motion, Improper body mechanics, Decreased activity tolerance, Decreased strength, Impaired UE functional use, Decreased mobility, Difficulty walking, Decreased endurance, Postural dysfunction, Increased muscle spasms, Impaired flexibility, Impaired tone, Hypomobility, Increased fascial restricitons, Pain  Visit Diagnosis: Acute pain of left shoulder  Muscle weakness (generalized)     Problem List Patient Active Problem List   Diagnosis Date Noted   Screen for colon cancer    Muscle cramps 05/28/2018   Pre-diabetes 05/28/2018   Fatty liver 05/28/2018   Hair loss 05/28/2018   Sciatica 11/01/2015   S/P right oophorectomy 11/01/2015   S/P hysterectomy 11/01/2015     Durwin Reges DPT Claiborne Billings O'Daniel, SPT Durwin Reges, PT 10/28/2021, 10:28 AM  [facility] [address] [place], [address] Phone: [phone]   Fax:  [phone]  Name: Keyarah Mcroy MRN: [id number] Date of Birth: 06-24-1961

## 2021-11-01 ENCOUNTER — Ambulatory Visit: Payer: PRIVATE HEALTH INSURANCE | Attending: Orthopedic Surgery | Admitting: Physical Therapy

## 2021-11-01 ENCOUNTER — Encounter: Payer: Self-pay | Admitting: Physical Therapy

## 2021-11-01 ENCOUNTER — Encounter: Payer: 59 | Admitting: Physical Therapy

## 2021-11-01 ENCOUNTER — Other Ambulatory Visit: Payer: Self-pay

## 2021-11-01 DIAGNOSIS — M6281 Muscle weakness (generalized): Secondary | ICD-10-CM | POA: Diagnosis present

## 2021-11-01 DIAGNOSIS — M25612 Stiffness of left shoulder, not elsewhere classified: Secondary | ICD-10-CM | POA: Insufficient documentation

## 2021-11-01 DIAGNOSIS — M25512 Pain in left shoulder: Secondary | ICD-10-CM | POA: Insufficient documentation

## 2021-11-01 NOTE — Therapy (Signed)
[place] [address], [place], [address] Phone: [phone]   Fax:  [phone]  Physical Therapy Treatment  Patient Details  Name: Carolyn Sampson MRN: [id number] Date of Birth: 1961/03/11 Referring Provider (PT): Tania Ade MD   Encounter Date: 11/01/2021   PT End of Session - 11/01/21 1538     Visit Number 11    Number of Visits 17    Date for PT Re-Evaluation 11/04/21    Authorization Type WC    Authorization - Visit Number 11    Authorization - Number of Visits 10    Progress Note Due on Visit 10    PT Start Time 0174    PT Stop Time 1430    PT Time Calculation (min) 45 min    Activity Tolerance Patient tolerated treatment well    Behavior During Therapy [facility] for tasks assessed/performed             Past Medical History:  Diagnosis Date   Fatty liver    Pre-diabetes     Past Surgical History:  Procedure Laterality Date   ABDOMINAL HYSTERECTOMY     APPENDECTOMY     CESAREAN SECTION     CESAREAN SECTION     COLONOSCOPY WITH PROPOFOL N/A 06/02/2021   Procedure: COLONOSCOPY WITH PROPOFOL;  Surgeon: Lin Landsman, MD;  Location: [facility];  Service: Gastroenterology;  Laterality: N/A;  SPANISH INTERPRETER   LAPAROSCOPIC HYSTERECTOMY     LAPAROSCOPIC OOPHERECTOMY Right    SHOULDER ARTHROSCOPY WITH ROTATOR CUFF REPAIR AND SUBACROMIAL DECOMPRESSION Left 08/22/2021   Procedure: SHOULDER ARTHROSCOPY WITH ROTATOR CUFF REPAIR AND SUBACROMIAL DECOMPRESSION;  Surgeon: Tania Ade, MD;  Location: [place];  Service: Orthopedics;  Laterality: Left;    There were no vitals filed for this visit.   Subjective Assessment - 11/01/21 1350     Subjective pnt reports 0/10 pain and being compliant with HEP    Patient is accompained by: Interpreter    Pertinent History Pt is a 61 year old female presenting s/p L shoulder arthroscopy supraspinatus repair and  subacromial decompression 08/22/21. Reports post surgical pain has been 9/10 since surgery. Is in a sling currently all hours of the day other than to shower. Has returned to surgeon 09/01/21 and got a good report. Has another appt scheduled for follow up in January. Reports incision has no drainage, but it is very senstivie to light touch. 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 In addition to full time work she reports walking 43mins 3-4x/week and she would like to return to this.  Pt reports she would like to return to her job after rehab but her boss has already told her she no longer has a job. Post surgical pain is as high as 9/10 with trunk rotation and trying to change clothes; and as low (and currently) 0/10. Pain is somewhat alleiviated by ice and pain medication. Patient lives with daughter who has been helping her though she works a lot (pt is alone often), has about 4 steps to enter 1 story home. Patient is currently not driving, daughter drives her to appts, her daughter makes meals and she is able to feed herself independently, is bathing modI ind standig in walkin shower soley with R hand, daughter fixing her hair for her, and dressing modI with no one else assisting her physically but she needs to  prop her arm with her other had and it is difficult. Sleeps in her bed on her back, with several pillows around her for comfort and to support her arm. Pt denies N/V, B&B changes, unexplained weight fluctuation, saddle paresthesia, fever, night sweats, or unrelenting night pain at this time.    Limitations Lifting;Reading;Writing;Walking;House hold activities    How long can you sit comfortably? unlimited    How long can you stand comfortably? unlimited    How long can you walk comfortably? unlimited    Diagnostic tests none post surgery    Patient Stated Goals restore motion/strength in shoulder in order to complete normal  household tasks    Currently in Pain? No/denies    Pain Score 0-No pain    Pain Location Shoulder    Pain Orientation Left    Pain Descriptors / Indicators Sharp;Tingling    Pain Type Surgical pain    Pain Onset 1 to 4 weeks ago    Pain Frequency Intermittent               Mannual therapy  Progressive TP release for 10 min on L UT   TherEx Pulleys for 5 min to increase shoulder mobility  Seated flexion AROM 3x6 Seated AROM abduction 3 x 6- max tactile cueing to keep shoulders down Pendulums 3 x 12 Wall slides with foam roller 2x 6 Seated ER RTB 2 x 6                           PT Education - 11/01/21 1538     Education Details therex form/ HEP    Person(s) Educated Patient    Methods Explanation;Demonstration;Tactile cues;Verbal cues    Comprehension Verbalized understanding;Returned demonstration              PT Short Term Goals - 09/08/21 1440       PT SHORT TERM GOAL #1   Title Pt will be independent with HEP in order to improve strength and decrease pain in order to improve pain-free function at home and work.    Baseline 09/08/21 HEP given    Time 4    Period Weeks    Status New               PT Long Term Goals - 09/08/21 1444       PT LONG TERM GOAL #1   Title Patient will increase FOTO score to 44 to demonstrate predicted increase in functional mobility to complete ADLs    Baseline 09/08/21 4    Time 12    Period Weeks      PT LONG TERM GOAL #2   Title Pt will decrease worst pain as reported on NPRS by at least 3 points in order to demonstrate clinically significant reduction in pain.    Baseline 09/08/21 9/10    Time 12    Period Weeks    Status New      PT LONG TERM GOAL #3   Title Pt will demonstrate full active L shoulder motion in order to complete self care ADLS    Baseline 62/8/36 unable to attempt    Time 12    Period Weeks    Status New      PT LONG TERM GOAL #4   Title Patient will demonstrate gross  L shoulder MMTs of 4+/5 in order to be able to complete heavy household tasks    Baseline 11/09/20 unable to assess  Time 12    Period Weeks    Status New                   Plan - 11/01/21 1539     Clinical Impression Statement PT continued therex progression for increased AROM and parascapular strength. pnt able to comply with mod cueing throughout session and deminstrated an increase in AROM shoulder flexion and abduction. Pnt tolerated mannual therapy to left upper trap well.  pnt demonstrated verbal understanding of PT instruction and maintained good motivation throughout session. PT will continue progression as able.    Personal Factors and Comorbidities Age;Comorbidity 1;Fitness;Past/Current Experience;Time since onset of injury/illness/exacerbation    Comorbidities pre-diabetes    Examination-Activity Limitations Carry;Lift;Reach Overhead;Sleep    Examination-Participation Restrictions Occupation;Driving;Community Activity;Cleaning;Laundry;Meal Prep    Stability/Clinical Decision Making Evolving/Moderate complexity    Clinical Decision Making Moderate    Rehab Potential Good    PT Frequency 2x / week    PT Duration 12 weeks    PT Treatment/Interventions ADLs/Self Care Home Management;Aquatic Therapy;Electrical Stimulation;Moist Heat;Ultrasound;DME Instruction;Functional mobility training;Therapeutic activities;Therapeutic exercise;Neuromuscular re-education;Patient/family education;Manual techniques;Passive range of motion;Energy conservation;Taping;Vasopneumatic Device;Joint Manipulations;Cryotherapy;Traction;Dry needling    PT Next Visit Plan AAROM progression- continued AROM progression; parascapular strengthening    PT Home Exercise Plan seated scapular retractions and bilateral ER    Consulted and Agree with Plan of Care Patient             Patient will benefit from skilled therapeutic intervention in order to improve the following deficits and impairments:   Decreased range of motion, Improper body mechanics, Decreased activity tolerance, Decreased strength, Impaired UE functional use, Decreased mobility, Difficulty walking, Decreased endurance, Postural dysfunction, Increased muscle spasms, Impaired flexibility, Impaired tone, Hypomobility, Increased fascial restricitons, Pain  Visit Diagnosis: Acute pain of left shoulder  Muscle weakness (generalized)  Stiffness of left shoulder, not elsewhere classified     Problem List Patient Active Problem List   Diagnosis Date Noted   Screen for colon cancer    Muscle cramps 05/28/2018   Pre-diabetes 05/28/2018   Fatty liver 05/28/2018   Hair loss 05/28/2018   Sciatica 11/01/2015   S/P right oophorectomy 11/01/2015   S/P hysterectomy 11/01/2015    Durwin Reges DPT Claiborne Billings O'Daniel, SPT Durwin Reges, PT 11/01/2021, 5:52 PM  [place] [facility] [address], [place], [address] Phone: [phone]   Fax:  [phone]  Name: Carolyn Sampson MRN: [id number] Date of Birth: 08-Feb-1961

## 2021-11-03 ENCOUNTER — Encounter: Payer: Self-pay | Admitting: Physical Therapy

## 2021-11-03 ENCOUNTER — Other Ambulatory Visit: Payer: Self-pay

## 2021-11-03 ENCOUNTER — Ambulatory Visit: Payer: PRIVATE HEALTH INSURANCE | Attending: Orthopedic Surgery | Admitting: Physical Therapy

## 2021-11-03 DIAGNOSIS — M25612 Stiffness of left shoulder, not elsewhere classified: Secondary | ICD-10-CM | POA: Diagnosis present

## 2021-11-03 DIAGNOSIS — M6281 Muscle weakness (generalized): Secondary | ICD-10-CM | POA: Insufficient documentation

## 2021-11-03 DIAGNOSIS — M25512 Pain in left shoulder: Secondary | ICD-10-CM | POA: Diagnosis present

## 2021-11-03 NOTE — Therapy (Signed)
[place] [facility] [address] [place], [address] Phone: [phone]   Fax:  [phone]  Physical Therapy Evaluation  Patient Details  Name: Lisa Milian MRN: [id number] Date of Birth: 1960-12-26 Referring Provider (PT): Tania Ade MD   Encounter Date: 11/03/2021   PT End of Session - 11/03/21 1353     Visit Number 12    Number of Visits 17    Authorization Type WC    Authorization - Visit Number 12    Authorization - Number of Visits 10    Progress Note Due on Visit 10    PT Start Time 1300    PT Stop Time 1345    PT Time Calculation (min) 45 min    Activity Tolerance Patient tolerated treatment well    Behavior During Therapy WFL for tasks assessed/performed             Past Medical History:  Diagnosis Date   Fatty liver    Pre-diabetes     Past Surgical History:  Procedure Laterality Date   ABDOMINAL HYSTERECTOMY     APPENDECTOMY     CESAREAN SECTION     CESAREAN SECTION     COLONOSCOPY WITH PROPOFOL N/A 06/02/2021   Procedure: COLONOSCOPY WITH PROPOFOL;  Surgeon: Lin Landsman, MD;  Location: [facility];  Service: Gastroenterology;  Laterality: N/A;  SPANISH INTERPRETER   LAPAROSCOPIC HYSTERECTOMY     LAPAROSCOPIC OOPHERECTOMY Right    SHOULDER ARTHROSCOPY WITH ROTATOR CUFF REPAIR AND SUBACROMIAL DECOMPRESSION Left 08/22/2021   Procedure: SHOULDER ARTHROSCOPY WITH ROTATOR CUFF REPAIR AND SUBACROMIAL DECOMPRESSION;  Surgeon: Tania Ade, MD;  Location: [place];  Service: Orthopedics;  Laterality: Left;    There were no vitals filed for this visit.    Subjective Assessment - 11/03/21 1635     Subjective pnt reports feeling worse. pnt was awoken by pain yesterday morning that has persisted. pnt reports 5/10 pain on NPS currently.    Patient is accompained by: Interpreter    Pertinent History Pt is a 61 year old female presenting s/p L shoulder  arthroscopy supraspinatus repair and subacromial decompression 08/22/21. Reports post surgical pain has been 9/10 since surgery. Is in a sling currently all hours of the day other than to shower. Has returned to surgeon 09/01/21 and got a good report. Has another appt scheduled for follow up in January. Reports incision has no drainage, but it is very senstivie to light touch. 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 In addition to full time work she reports walking 41mns 3-4x/week and she would like to return to this.  Pt reports she would like to return to her job after rehab but her boss has already told her she no longer has a job. Post surgical pain is as high as 9/10 with trunk rotation and trying to change clothes; and as low (and currently) 0/10. Pain is somewhat alleiviated by ice and pain medication. Patient lives with daughter who has been helping her though she works a lot (pt is alone often), has about 4 steps to enter 1 story home. Patient is currently not driving, daughter drives her to appts, her daughter makes meals and she is able to feed herself independently, is bathing modI ind standig in walkin shower soley with R hand, daughter fixing her hair for her, and dressing modI with no one else assisting her  physically but she needs to prop her arm with her other had and it is difficult. Sleeps in her bed on her back, with several pillows around her for comfort and to support her arm. Pt denies N/V, B&B changes, unexplained weight fluctuation, saddle paresthesia, fever, night sweats, or unrelenting night pain at this time.    Limitations Lifting;Reading;Writing;Walking;House hold activities    How long can you sit comfortably? unlimited    How long can you stand comfortably? unlimited    How long can you walk comfortably? unlimited    Diagnostic tests none post surgery    Patient Stated Goals restore motion/strength in shoulder  in order to complete normal household tasks    Pain Onset 1 to 4 weeks ago            Ther-Ex Pulleys 2 min flexion, 2 min abduction - mod cueing for maintaining neutral spine AAROM with dowel 5# shoulder flexion 3x 7 - max cueing for reaching end range flexion AAROM abduction x 7 with dowel - max tactile cueing for relaxing UT  AROM abduction 3 x 6 approx 95 degrees- max tactile cueing for relaxing UT Pendulums 2 x12  Shoulder ER with RTB 3 x 8 - mod cueing for keeping elbows bent and external rotator activation   SPT updated goals: pnt still has deficits in ROM, strength, and scapulohumeral rhythm. Pnt educated on the importance of pushing to complete exercises at home and pain science   HEP updated to include AROM shoulder flexion and abduction, ER with RTB, wall slides, and pulleys to achieve more functional mobility            Objective measurements completed on examination: See above findings.                PT Education - 11/03/21 1353     Education Details HEP    Person(s) Educated Patient    Methods Explanation;Demonstration;Tactile cues;Verbal cues    Comprehension Verbalized understanding;Returned demonstration              PT Short Term Goals - 11/03/21 1304       PT SHORT TERM GOAL #1   Title Pt will be independent with HEP in order to improve strength and decrease pain in order to improve pain-free function at home and work.    Baseline 09/08/21 HEP given    Time 4    Period Weeks    Status Achieved      PT SHORT TERM GOAL #2   Title Patient will increase FOTO score to equal to or greater than   53  to demonstrate statistically significant improvement in mobility and quality of life.    Time 4    Period Weeks      PT SHORT TERM GOAL #3   Title Pt will improve shoulder flexion and abduction AROM by 15 degrees each indicationg progress with funcitonal shoulder elevation    Baseline 80ABD, 105 Flexion at IE, 50 abd and 50 flex at PN  10/10. 11/03/21: flexion:135, abduction 105    Time 4    Period Weeks    Status Achieved               PT Long Term Goals - 11/03/21 1314       PT LONG TERM GOAL #1   Title Patient will increase FOTO score to 44 to demonstrate predicted increase in functional mobility to complete ADLs    Baseline 09/08/21 4    Time 12  Period Weeks      PT LONG TERM GOAL #2   Title Pt will decrease worst pain as reported on NPRS by at least 3 points in order to demonstrate clinically significant reduction in pain.    Baseline 09/08/21 9/10. 11/03/21 5/10 NPS    Time 12    Period Weeks    Status Achieved      PT LONG TERM GOAL #3   Title Pt will demonstrate full active L shoulder motion in order to complete self care ADLS    Baseline 40/1/02 unable to attempt. 11/03/21 limited    Time 12    Period Weeks    Status Not Met      PT LONG TERM GOAL #4   Title Patient will demonstrate gross L shoulder MMTs of 4+/5 in order to be able to complete heavy household tasks    Baseline flexion 3+/5, abduction 4/5 in 20 degrees of abduction painfree range, extension 5/5 in pain free rane, IR 4/5, ER 3/5    Time 12    Period Weeks    Status Partially Met                    Plan - 11/03/21 1354     Clinical Impression Statement SPT continued therex progression for increased AROM and PROM. pnt able to comply with mod verbal and tactile cueing.pnt maintained good motivation throughout session and demonstrated understanding of SPT intruction on both therex and HEP. PT reassesed goals this session where patient is making good progress toward goals, but still has deficits in ROM, strength, and scapulohumeral rhythm inhibiting her ability to perform ADLs and job duties. Patient will continue to benefit from skilled PT to address these impairments to return to optimal, safe PLOF.  PT will continue progression as able    Personal Factors and Comorbidities Age;Comorbidity 1;Fitness;Past/Current  Experience;Time since onset of injury/illness/exacerbation    Comorbidities pre-diabetes    Examination-Activity Limitations Carry;Lift;Reach Overhead;Sleep    Examination-Participation Restrictions Occupation;Driving;Community Activity;Cleaning;Laundry;Meal Prep    Stability/Clinical Decision Making Evolving/Moderate complexity    Clinical Decision Making Moderate    Rehab Potential Good    PT Frequency 2x / week    PT Duration 12 weeks    PT Treatment/Interventions ADLs/Self Care Home Management;Aquatic Therapy;Electrical Stimulation;Moist Heat;Ultrasound;DME Instruction;Functional mobility training;Therapeutic activities;Therapeutic exercise;Neuromuscular re-education;Patient/family education;Manual techniques;Passive range of motion;Energy conservation;Taping;Vasopneumatic Device;Joint Manipulations;Cryotherapy;Traction;Dry needling    PT Next Visit Plan AAROM progression- continued AROM progression; parascapular strengthening    PT Home Exercise Plan pulleys abd and flex, bilat ER RTB, AROM flex and abd, wall slides    Consulted and Agree with Plan of Care Patient             Patient will benefit from skilled therapeutic intervention in order to improve the following deficits and impairments:  Decreased range of motion, Improper body mechanics, Decreased activity tolerance, Decreased strength, Impaired UE functional use, Decreased mobility, Difficulty walking, Decreased endurance, Postural dysfunction, Increased muscle spasms, Impaired flexibility, Impaired tone, Hypomobility, Increased fascial restricitons, Pain  Visit Diagnosis: Acute pain of left shoulder  Muscle weakness (generalized)  Stiffness of left shoulder, not elsewhere classified     Problem List Patient Active Problem List   Diagnosis Date Noted   Screen for colon cancer    Muscle cramps 05/28/2018   Pre-diabetes 05/28/2018   Fatty liver 05/28/2018   Hair loss 05/28/2018   Sciatica 11/01/2015   S/P right  oophorectomy 11/01/2015   S/P hysterectomy 11/01/2015    [facility]  Terance Hart DPT Claiborne Billings O'Daniel, SPT Durwin Reges, PT 11/04/2021, 10:20 AM  [place] [facility] [address], [place], [address] Phone: [phone]   Fax:  [phone]  Name: Axelle Szwed MRN: [id number] Date of Birth: 10/17/1960

## 2021-11-04 DIAGNOSIS — M25512 Pain in left shoulder: Secondary | ICD-10-CM | POA: Diagnosis present

## 2021-11-04 DIAGNOSIS — M25612 Stiffness of left shoulder, not elsewhere classified: Secondary | ICD-10-CM | POA: Diagnosis present

## 2021-11-04 DIAGNOSIS — M6281 Muscle weakness (generalized): Secondary | ICD-10-CM | POA: Diagnosis present

## 2021-11-08 ENCOUNTER — Ambulatory Visit: Payer: PRIVATE HEALTH INSURANCE | Admitting: Physical Therapy

## 2021-11-08 ENCOUNTER — Encounter: Payer: Self-pay | Admitting: Physical Therapy

## 2021-11-08 DIAGNOSIS — M25612 Stiffness of left shoulder, not elsewhere classified: Secondary | ICD-10-CM

## 2021-11-08 DIAGNOSIS — M25512 Pain in left shoulder: Secondary | ICD-10-CM

## 2021-11-08 DIAGNOSIS — M6281 Muscle weakness (generalized): Secondary | ICD-10-CM

## 2021-11-08 NOTE — Therapy (Signed)
[facility] [address] [place], [address] Phone: [phone]   Fax:  [phone]  Physical Therapy Treatment  Patient Details  Name: Carolyn Sampson MRN: [id number] Date of Birth: [birth year] Referring Provider (PT): Tania Ade MD   Encounter Date: 11/08/2021   PT End of Session - 11/08/21 1350     Visit Number 13    Number of Visits 17    Date for PT Re-Evaluation 11/04/21    Authorization Type WC    Authorization - Visit Number 13    Authorization - Number of Visits 10    Progress Note Due on Visit 10    PT Start Time 1300    PT Stop Time 1345    PT Time Calculation (min) 45 min    Activity Tolerance Patient tolerated treatment well    Behavior During Therapy WFL for tasks assessed/performed             Past Medical History:  Diagnosis Date   Fatty liver    Pre-diabetes     Past Surgical History:  Procedure Laterality Date   ABDOMINAL HYSTERECTOMY     APPENDECTOMY     CESAREAN SECTION     CESAREAN SECTION     COLONOSCOPY WITH PROPOFOL N/A 06/02/2021   Procedure: COLONOSCOPY WITH PROPOFOL;  Surgeon: Lin Landsman, MD;  Location: [facility];  Service: Gastroenterology;  Laterality: N/A;  SPANISH INTERPRETER   LAPAROSCOPIC HYSTERECTOMY     LAPAROSCOPIC OOPHERECTOMY Right    SHOULDER ARTHROSCOPY WITH ROTATOR CUFF REPAIR AND SUBACROMIAL DECOMPRESSION Left 08/22/2021   Procedure: SHOULDER ARTHROSCOPY WITH ROTATOR CUFF REPAIR AND SUBACROMIAL DECOMPRESSION;  Surgeon: Tania Ade, MD;  Location: [facility];  Service: Orthopedics;  Laterality: Left;    There were no vitals filed for this visit.   Subjective Assessment - 11/08/21 1304     Subjective pnt reports no pain and being diligent with HEP    Patient is accompained by: Interpreter    Pertinent History Pt is a 61 year old female presenting s/p L shoulder arthroscopy supraspinatus repair and subacromial  decompression 08/22/21. Reports post surgical pain has been 9/10 since surgery. Is in a sling currently all hours of the day other than to shower. Has returned to surgeon 09/01/21 and got a good report. Has another appt scheduled for follow up in January. Reports incision has no drainage, but it is very senstivie to light touch. 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 In addition to full time work she reports walking 90mns 3-4x/week and she would like to return to this.  Pt reports she would like to return to her job after rehab but her boss has already told her she no longer has a job. Post surgical pain is as high as 9/10 with trunk rotation and trying to change clothes; and as low (and currently) 0/10. Pain is somewhat alleiviated by ice and pain medication. Patient lives with daughter who has been helping her though she works a lot (pt is alone often), has about 4 steps to enter 1 story home. Patient is currently not driving, daughter drives her to appts, her daughter makes meals and she is able to feed herself independently, is bathing modI ind standig in walkin shower soley with R hand, daughter fixing her hair for her, and dressing modI with no one else assisting her physically but she needs to  prop her arm with her other had and it is difficult. Sleeps in her bed on her back, with several pillows around her for comfort and to support her arm. Pt denies N/V, B&B changes, unexplained weight fluctuation, saddle paresthesia, fever, night sweats, or unrelenting night pain at this time.    Limitations Lifting;Reading;Writing;Walking;House hold activities    How long can you sit comfortably? unlimited    How long can you stand comfortably? unlimited    How long can you walk comfortably? unlimited    Diagnostic tests none post surgery    Patient Stated Goals restore motion/strength in shoulder in order to complete normal household tasks     Currently in Pain? No/denies    Pain Score 0-No pain              Ther-Ex Pulleys 2 min flexion, 2 min abduction - good carry over from last session Wall Slides with foam roller- 3 x 8- carryover from last session  AAROM with dowel 5# shoulder flexion 3x 6- mod cueing for reaching end range flexion- flexion 137 degrees AROM abduction 3 x 6 approx 80 degrees- mod tactile cueing for relaxing UT and maintain neutral spine Pendulums 2 x12  Shoulder ER with RTB 3 x 8 - good carry over from last session Scapula retractions GTB 3 x 8 - mod cueing to keep elbows bend and activate scapular retractors    Wall Slides- x12                            PT Education - 11/08/21 1349     Education Details HEP and therex form    Person(s) Educated Patient    Methods Explanation;Demonstration;Tactile cues;Verbal cues    Comprehension Verbalized understanding;Returned demonstration              PT Short Term Goals - 11/03/21 1304       PT SHORT TERM GOAL #1   Title Pt will be independent with HEP in order to improve strength and decrease pain in order to improve pain-free function at home and work.    Baseline 09/08/21 HEP given    Time 4    Period Weeks    Status Achieved      PT SHORT TERM GOAL #2   Title Patient will increase FOTO score to equal to or greater than   53  to demonstrate statistically significant improvement in mobility and quality of life.    Time 4    Period Weeks      PT SHORT TERM GOAL #3   Title Pt will improve shoulder flexion and abduction AROM by 15 degrees each indicationg progress with funcitonal shoulder elevation    Baseline 80ABD, 105 Flexion at IE, 50 abd and 50 flex at PN 10/10. 11/03/21: flexion:135, abduction 105    Time 4    Period Weeks    Status Achieved               PT Long Term Goals - 11/03/21 1314       PT LONG TERM GOAL #1   Title Patient will increase FOTO score to 44 to demonstrate predicted increase in  functional mobility to complete ADLs    Baseline 09/08/21 4    Time 12    Period Weeks      PT LONG TERM GOAL #2   Title Pt will decrease worst pain as reported on NPRS by at least 3 points in  order to demonstrate clinically significant reduction in pain.    Baseline 09/08/21 9/10. 11/03/21 5/10 NPS    Time 12    Period Weeks    Status Achieved      PT LONG TERM GOAL #3   Title Pt will demonstrate full active L shoulder motion in order to complete self care ADLS    Baseline 03/04/00 unable to attempt. 11/03/21 limited    Time 12    Period Weeks    Status Not Met      PT LONG TERM GOAL #4   Title Patient will demonstrate gross L shoulder MMTs of 4+/5 in order to be able to complete heavy household tasks    Baseline flexion 3+/5, abduction 4/5 in 20 degrees of abduction painfree range, extension 5/5 in pain free rane, IR 4/5, ER 3/5    Time 12    Period Weeks    Status Partially Met                   Plan - 11/08/21 1351     Clinical Impression Statement pnt presented with no pain which allowed for therex progression to include functional shoulder AROM and parascapular strengthening. spt performed mannual therapy and educated pnt on how to perform it at home to increase ROM and decrease pain. pnt tolerated mannual therapy and incread therex progress well with mod cueing. PT will progress as able    Personal Factors and Comorbidities Age;Comorbidity 1;Fitness;Past/Current Experience;Time since onset of injury/illness/exacerbation    Comorbidities pre-diabetes    Examination-Activity Limitations Carry;Lift;Reach Overhead;Sleep    Examination-Participation Restrictions Occupation;Driving;Community Activity;Cleaning;Laundry;Meal Prep    Stability/Clinical Decision Making Evolving/Moderate complexity    Clinical Decision Making Moderate    Rehab Potential Good    PT Frequency 2x / week    PT Duration 12 weeks    PT Treatment/Interventions ADLs/Self Care Home Management;Aquatic  Therapy;Electrical Stimulation;Moist Heat;Ultrasound;DME Instruction;Functional mobility training;Therapeutic activities;Therapeutic exercise;Neuromuscular re-education;Patient/family education;Manual techniques;Passive range of motion;Energy conservation;Taping;Vasopneumatic Device;Joint Manipulations;Cryotherapy;Traction;Dry needling    PT Next Visit Plan AAROM progression- continued AROM progression; parascapular strengthening    PT Home Exercise Plan pulleys abd and flex, bilat ER RTB, AROM flex and abd, wall slides             Patient will benefit from skilled therapeutic intervention in order to improve the following deficits and impairments:  Decreased range of motion, Improper body mechanics, Decreased activity tolerance, Decreased strength, Impaired UE functional use, Decreased mobility, Difficulty walking, Decreased endurance, Postural dysfunction, Increased muscle spasms, Impaired flexibility, Impaired tone, Hypomobility, Increased fascial restricitons, Pain  Visit Diagnosis: Acute pain of left shoulder  Muscle weakness (generalized)  Stiffness of left shoulder, not elsewhere classified     Problem List Patient Active Problem List   Diagnosis Date Noted   Screen for colon cancer    Muscle cramps 05/28/2018   Pre-diabetes 05/28/2018   Fatty liver 05/28/2018   Hair loss 05/28/2018   Sciatica 11/01/2015   S/P right oophorectomy 11/01/2015   S/P hysterectomy 11/01/2015     Durwin Reges DPT Claiborne Billings O'Daniel, SPT Durwin Reges, PT 11/08/2021, 4:38 PM  [facility] [address], [place], [address] Phone: [phone]   Fax:  [phone]  Name: Carolyn Sampson MRN: [id number] Date of Birth: 11/09/1960

## 2021-11-10 ENCOUNTER — Ambulatory Visit: Payer: Worker's Compensation | Admitting: Physical Therapy

## 2021-11-15 ENCOUNTER — Ambulatory Visit: Payer: PRIVATE HEALTH INSURANCE | Admitting: Physical Therapy

## 2021-11-15 ENCOUNTER — Encounter: Payer: Self-pay | Admitting: Physical Therapy

## 2021-11-15 ENCOUNTER — Other Ambulatory Visit: Payer: Self-pay

## 2021-11-15 DIAGNOSIS — M25512 Pain in left shoulder: Secondary | ICD-10-CM

## 2021-11-15 DIAGNOSIS — M6281 Muscle weakness (generalized): Secondary | ICD-10-CM

## 2021-11-15 DIAGNOSIS — M25612 Stiffness of left shoulder, not elsewhere classified: Secondary | ICD-10-CM

## 2021-11-15 NOTE — Therapy (Signed)
[facility] [address], [place], [address] Phone: [phone]   Fax:  [phone]  Physical Therapy Treatment  Patient Details  Name: Carolyn Sampson MRN: [id number] Date of Birth: 27-Aug-1961 Referring Provider (PT): Tania Ade MD   Encounter Date: 11/15/2021   PT End of Session - 11/15/21 1307     Visit Number 14    Number of Visits 17    Date for PT Re-Evaluation 12/29/21    Authorization Type WC    Authorization - Visit Number 13    Authorization - Number of Visits 10    Progress Note Due on Visit 10    PT Start Time 1302    PT Stop Time 1345    PT Time Calculation (min) 43 min    Activity Tolerance Patient tolerated treatment well    Behavior During Therapy WFL for tasks assessed/performed             Past Medical History:  Diagnosis Date   Fatty liver    Pre-diabetes     Past Surgical History:  Procedure Laterality Date   ABDOMINAL HYSTERECTOMY     APPENDECTOMY     CESAREAN SECTION     CESAREAN SECTION     COLONOSCOPY WITH PROPOFOL N/A 06/02/2021   Procedure: COLONOSCOPY WITH PROPOFOL;  Surgeon: Lin Landsman, MD;  Location: [facility];  Service: Gastroenterology;  Laterality: N/A;  SPANISH INTERPRETER   LAPAROSCOPIC HYSTERECTOMY     LAPAROSCOPIC OOPHERECTOMY Right    SHOULDER ARTHROSCOPY WITH ROTATOR CUFF REPAIR AND SUBACROMIAL DECOMPRESSION Left 08/22/2021   Procedure: SHOULDER ARTHROSCOPY WITH ROTATOR CUFF REPAIR AND SUBACROMIAL DECOMPRESSION;  Surgeon: Tania Ade, MD;  Location: [facility];  Service: Orthopedics;  Laterality: Left;    There were no vitals filed for this visit.   Subjective Assessment - 11/15/21 1304     Subjective Patient states she is doing well today. Denies pain. Is compliant with HEP. Reports numbness/tingling    Patient is accompained by: Interpreter    Pertinent History Pt is a 61 year old female presenting s/p L  shoulder arthroscopy supraspinatus repair and subacromial decompression 08/22/21. Reports post surgical pain has been 9/10 since surgery. Is in a sling currently all hours of the day other than to shower. Has returned to surgeon 09/01/21 and got a good report. Has another appt scheduled for follow up in January. Reports incision has no drainage, but it is very senstivie to light touch. 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 In addition to full time work she reports walking 8mns 3-4x/week and she would like to return to this.  Pt reports she would like to return to her job after rehab but her boss has already told her she no longer has a job. Post surgical pain is as high as 9/10 with trunk rotation and trying to change clothes; and as low (and currently) 0/10. Pain is somewhat alleiviated by ice and pain medication. Patient lives with daughter who has been helping her though she works a lot (pt is alone often), has about 4 steps to enter 1 story home. Patient is currently not driving, daughter drives her to appts, her daughter makes meals and she is able to feed herself independently, is bathing modI ind standig in walkin shower soley with R hand, daughter fixing her hair for her, and dressing modI with no one else assisting  her physically but she needs to prop her arm with her other had and it is difficult. Sleeps in her bed on her back, with several pillows around her for comfort and to support her arm. Pt denies N/V, B&B changes, unexplained weight fluctuation, saddle paresthesia, fever, night sweats, or unrelenting night pain at this time.    Limitations Lifting;Reading;Writing;Walking;House hold activities    How long can you sit comfortably? unlimited    How long can you stand comfortably? unlimited    How long can you walk comfortably? unlimited    Diagnostic tests none post surgery    Patient Stated Goals restore motion/strength in  shoulder in order to complete normal household tasks    Currently in Pain? No/denies              **LEFT SHOULDER**  INTERVENTIONS  Ther-Ex Pulleys 2 min flexion, 2 min abduction - good carry over from last session Wall Slides with foam roller- 3 x 10 - carryover from last session  AAROM shoulder flexion w/ dowel #5, 3x 6 AROM abduction, 3 x 6 approx 70 degrees  -multimodal cueing for relaxing UT and maintain neutral spine Shoulder ER with RTB 2 x 10  -multimodal cueing for technique  Scapula retractions GTB 2 x 10    Manual PROM - GH flexion, scapular plane, abduction and ER, x5 minutes STM to anterior and middle deltoid, x4 minutes Ulnar nerve glides - 2 x 10, no hold     HEP: Ulnar Nerve Flossing ([id number]) Pt practiced performing actively with decreased GH abduction, x10 reps. VC to remind wrist extension with elbow extension.    Clinical Impression: Pt demonstrates excellent motivation within session. Pain does increase significantly when performing GH abduction, both passively and with AAROM. Highly sensitive to light pressure in middle and anterior deltoid. Flexion and scaption are progressing well. She remains significantly limited in left GH abduction and ER. Ulnar nerve flossing was initiated and added to HEP due to report of numbness and tingling within the ulnar nerve distribution. Pt will continue to benefit from skilled PT to address limitations in strength, ROM and functional mobility with decreased pain and improved QOL.           PT Short Term Goals - 11/03/21 1304       PT SHORT TERM GOAL #1   Title Pt will be independent with HEP in order to improve strength and decrease pain in order to improve pain-free function at home and work.    Baseline 09/08/21 HEP given    Time 4    Period Weeks    Status Achieved      PT SHORT TERM GOAL #2   Title Patient will increase FOTO score to equal to or greater than   53  to demonstrate statistically  significant improvement in mobility and quality of life.    Time 4    Period Weeks      PT SHORT TERM GOAL #3   Title Pt will improve shoulder flexion and abduction AROM by 15 degrees each indicationg progress with funcitonal shoulder elevation    Baseline 80ABD, 105 Flexion at IE, 50 abd and 50 flex at PN 10/10. 11/03/21: flexion:135, abduction 105    Time 4    Period Weeks    Status Achieved               PT Long Term Goals - 11/03/21 1314       PT LONG TERM GOAL #1   Title  Patient will increase FOTO score to 44 to demonstrate predicted increase in functional mobility to complete ADLs    Baseline 09/08/21 4    Time 12    Period Weeks      PT LONG TERM GOAL #2   Title Pt will decrease worst pain as reported on NPRS by at least 3 points in order to demonstrate clinically significant reduction in pain.    Baseline 09/08/21 9/10. 11/03/21 5/10 NPS    Time 12    Period Weeks    Status Achieved      PT LONG TERM GOAL #3   Title Pt will demonstrate full active L shoulder motion in order to complete self care ADLS    Baseline 49/6/75 unable to attempt. 11/03/21 limited    Time 12    Period Weeks    Status Not Met      PT LONG TERM GOAL #4   Title Patient will demonstrate gross L shoulder MMTs of 4+/5 in order to be able to complete heavy household tasks    Baseline flexion 3+/5, abduction 4/5 in 20 degrees of abduction painfree range, extension 5/5 in pain free rane, IR 4/5, ER 3/5    Time 12    Period Weeks    Status Partially Met                   Plan - 11/15/21 1412     Clinical Impression Statement Pt demonstrates excellent motivation within session. Pain does increase significantly when performing GH abduction, both passively and with AAROM. Highly sensitive to light pressure in middle and anterior deltoid. Flexion and scaption are progressing well. She remains significantly limited in left GH abduction and ER. Ulnar nerve flossing was initiated and added to HEP  due to report of numbness and tingling within the ulnar nerve distribution. Pt will continue to benefit from skilled PT to address limitations in strength, ROM and functional mobility with decreased pain and improved QOL.    Personal Factors and Comorbidities Age;Comorbidity 1;Fitness;Past/Current Experience;Time since onset of injury/illness/exacerbation    Comorbidities pre-diabetes    Examination-Activity Limitations Carry;Lift;Reach Overhead;Sleep    Examination-Participation Restrictions Occupation;Driving;Community Activity;Cleaning;Laundry;Meal Prep    Stability/Clinical Decision Making Evolving/Moderate complexity    Rehab Potential Good    PT Frequency 2x / week    PT Duration 12 weeks    PT Treatment/Interventions ADLs/Self Care Home Management;Aquatic Therapy;Electrical Stimulation;Moist Heat;Ultrasound;DME Instruction;Functional mobility training;Therapeutic activities;Therapeutic exercise;Neuromuscular re-education;Patient/family education;Manual techniques;Passive range of motion;Energy conservation;Taping;Vasopneumatic Device;Joint Manipulations;Cryotherapy;Traction;Dry needling    PT Next Visit Plan AAROM progression- continued AROM progression; parascapular strengthening    PT Home Exercise Plan pulleys abd and flex, bilat ER RTB, AROM flex and abd, wall slides             Patient will benefit from skilled therapeutic intervention in order to improve the following deficits and impairments:  Decreased range of motion, Improper body mechanics, Decreased activity tolerance, Decreased strength, Impaired UE functional use, Decreased mobility, Difficulty walking, Decreased endurance, Postural dysfunction, Increased muscle spasms, Impaired flexibility, Impaired tone, Hypomobility, Increased fascial restricitons, Pain  Visit Diagnosis: Acute pain of left shoulder  Muscle weakness (generalized)  Stiffness of left shoulder, not elsewhere classified     Problem List Patient  Active Problem List   Diagnosis Date Noted   Screen for colon cancer    Muscle cramps 05/28/2018   Pre-diabetes 05/28/2018   Fatty liver 05/28/2018   Hair loss 05/28/2018   Sciatica 11/01/2015   S/P right oophorectomy 11/01/2015  S/P hysterectomy 11/01/2015    Patrina Levering PT, DPT   [facility] [address], [place], [address] Phone: [phone]   Fax:  [phone]  Name: Kashana Breach MRN: [id number] Date of Birth: [birth year]

## 2021-11-17 ENCOUNTER — Ambulatory Visit: Payer: Worker's Compensation | Admitting: Physical Therapy

## 2021-11-22 ENCOUNTER — Other Ambulatory Visit: Payer: Self-pay

## 2021-11-22 ENCOUNTER — Ambulatory Visit: Payer: PRIVATE HEALTH INSURANCE | Attending: Orthopedic Surgery | Admitting: Physical Therapy

## 2021-11-22 DIAGNOSIS — M6281 Muscle weakness (generalized): Secondary | ICD-10-CM | POA: Diagnosis present

## 2021-11-22 DIAGNOSIS — M25512 Pain in left shoulder: Secondary | ICD-10-CM | POA: Insufficient documentation

## 2021-11-22 DIAGNOSIS — M25612 Stiffness of left shoulder, not elsewhere classified: Secondary | ICD-10-CM | POA: Insufficient documentation

## 2021-11-22 NOTE — Therapy (Signed)
[facility] [address], [place], [address] Phone: [phone]   Fax:  [phone]  Physical Therapy Treatment  Patient Details  Name: Carolyn Sampson MRN: [id number] Date of Birth: 1961-01-04 Referring Provider (PT): Tania Ade MD   Encounter Date: 11/22/2021   PT End of Session - 11/22/21 1306     Visit Number 15    Number of Visits 17    Date for PT Re-Evaluation 12/29/21    Authorization Type WC    Authorization - Visit Number 13    Authorization - Number of Visits 10    Progress Note Due on Visit 10    PT Start Time 1302    PT Stop Time 1345    PT Time Calculation (min) 43 min    Activity Tolerance Patient tolerated treatment well    Behavior During Therapy WFL for tasks assessed/performed             Past Medical History:  Diagnosis Date   Fatty liver    Pre-diabetes     Past Surgical History:  Procedure Laterality Date   ABDOMINAL HYSTERECTOMY     APPENDECTOMY     CESAREAN SECTION     CESAREAN SECTION     COLONOSCOPY WITH PROPOFOL N/A 06/02/2021   Procedure: COLONOSCOPY WITH PROPOFOL;  Surgeon: Lin Landsman, MD;  Location: [facility];  Service: Gastroenterology;  Laterality: N/A;  SPANISH INTERPRETER   LAPAROSCOPIC HYSTERECTOMY     LAPAROSCOPIC OOPHERECTOMY Right    SHOULDER ARTHROSCOPY WITH ROTATOR CUFF REPAIR AND SUBACROMIAL DECOMPRESSION Left 08/22/2021   Procedure: SHOULDER ARTHROSCOPY WITH ROTATOR CUFF REPAIR AND SUBACROMIAL DECOMPRESSION;  Surgeon: Tania Ade, MD;  Location: [facility];  Service: Orthopedics;  Laterality: Left;    There were no vitals filed for this visit.   Subjective Assessment - 11/22/21 1304     Subjective Patient states she is doing well today. Reports 1/10 pain. Is compliant with HEP. Reports numbness/tingling in hand while she is driving.    Patient is accompained by: Interpreter    Pertinent History Pt is a 61  year old female presenting s/p L shoulder arthroscopy supraspinatus repair and subacromial decompression 08/22/21. Reports post surgical pain has been 9/10 since surgery. Is in a sling currently all hours of the day other than to shower. Has returned to surgeon 09/01/21 and got a good report. Has another appt scheduled for follow up in January. Reports incision has no drainage, but it is very senstivie to light touch. 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 In addition to full time work she reports walking 1mns 3-4x/week and she would like to return to this.  Pt reports she would like to return to her job after rehab but her boss has already told her she no longer has a job. Post surgical pain is as high as 9/10 with trunk rotation and trying to change clothes; and as low (and currently) 0/10. Pain is somewhat alleiviated by ice and pain medication. Patient lives with daughter who has been helping her though she works a lot (pt is alone often), has about 4 steps to enter 1 story home. Patient is currently not driving, daughter drives her to appts, her daughter makes meals and she is able to feed herself independently, is bathing modI ind standig in walkin shower soley with R hand, daughter fixing her hair for her, and  dressing modI with no one else assisting her physically but she needs to prop her arm with her other had and it is difficult. Sleeps in her bed on her back, with several pillows around her for comfort and to support her arm. Pt denies N/V, B&B changes, unexplained weight fluctuation, saddle paresthesia, fever, night sweats, or unrelenting night pain at this time.    Limitations Lifting;Reading;Writing;Walking;House hold activities    How long can you sit comfortably? unlimited    How long can you stand comfortably? unlimited    How long can you walk comfortably? unlimited    Diagnostic tests none post surgery    Patient Stated  Goals restore motion/strength in shoulder in order to complete normal household tasks    Currently in Pain? Yes    Pain Score 1     Pain Location Shoulder    Pain Orientation Left             **LEFT SHOULDER**   INTERVENTIONS   Ther-Ex Pulleys 2 min flexion, 2 min abduction - good carry over from last session Wall Slides with foam roller- 3 x 10  GH flexion w/ neutral grip 1# DB, ~70* 3 x10 Supine AAROM shoulder flexion w/ dowel, 3x 10 Supine AAROM chest press w/ dowel     Manual PROM progressing to AAROM through full ROM - GH flexion, scapular plane, abduction and ER, x10 minutes. *painful at end range, significant limitations in abduction.    Cryotherapy applied intermittently throughout session to alleviate pain.      HEP: ([id number]) Radial Nerve Flossing - 1 x daily - 7 x weekly - 3 sets - 10 reps *with decreased GH extension Supine Shoulder Flexion Extension AAROM with Dowel - 1 x daily - 7 x weekly - 3 sets - 10 reps Supine Shoulder Press AAROM in Abduction with Dowel - 1 x daily - 7 x weekly - 3 sets - 10 reps Seated Scapular Retraction - 1 x daily - 7 x weekly - 3 sets - 10 reps Seated Shoulder Row with Anchored Resistance - 1 x daily - 7 x weekly - 3 sets - 10 reps   Pt practiced performing each HEP exercise , x10 reps (or more) each to ensure safety with performance at home.      Clinical Impression: Pt demonstrates excellent motivation within session. She seems to be more pain limited this session than last. GH abduction ROM (passive and active) continue to be more limited than flexion. ROM into ER is [facility] with pain at only end range. "Catching" sensation with both flexion and abduction. Additional exercises were added to her HEP to improve ROM and strength; pt was encouraged to continue with pulleys. Pt will continue to benefit from skilled PT to address limitations in strength, ROM and functional mobility with decreased pain and improved QOL.            PT Short Term Goals - 11/03/21 1304       PT SHORT TERM GOAL #1   Title Pt will be independent with HEP in order to improve strength and decrease pain in order to improve pain-free function at home and work.    Baseline 09/08/21 HEP given    Time 4    Period Weeks    Status Achieved      PT SHORT TERM GOAL #2   Title Patient will increase FOTO score to equal to or greater than   53  to demonstrate statistically significant improvement in mobility and  quality of life.    Time 4    Period Weeks      PT SHORT TERM GOAL #3   Title Pt will improve shoulder flexion and abduction AROM by 15 degrees each indicationg progress with funcitonal shoulder elevation    Baseline 80ABD, 105 Flexion at IE, 50 abd and 50 flex at PN 10/10. 11/03/21: flexion:135, abduction 105    Time 4    Period Weeks    Status Achieved               PT Long Term Goals - 11/03/21 1314       PT LONG TERM GOAL #1   Title Patient will increase FOTO score to 44 to demonstrate predicted increase in functional mobility to complete ADLs    Baseline 09/08/21 4    Time 12    Period Weeks      PT LONG TERM GOAL #2   Title Pt will decrease worst pain as reported on NPRS by at least 3 points in order to demonstrate clinically significant reduction in pain.    Baseline 09/08/21 9/10. 11/03/21 5/10 NPS    Time 12    Period Weeks    Status Achieved      PT LONG TERM GOAL #3   Title Pt will demonstrate full active L shoulder motion in order to complete self care ADLS    Baseline 56/2/56 unable to attempt. 11/03/21 limited    Time 12    Period Weeks    Status Not Met      PT LONG TERM GOAL #4   Title Patient will demonstrate gross L shoulder MMTs of 4+/5 in order to be able to complete heavy household tasks    Baseline flexion 3+/5, abduction 4/5 in 20 degrees of abduction painfree range, extension 5/5 in pain free rane, IR 4/5, ER 3/5    Time 12    Period Weeks    Status Partially Met                   Plan  - 11/22/21 1441     Clinical Impression Statement Pt demonstrates excellent motivation within session. She seems to be more pain limited this session than last. GH abduction ROM (passive and active) continue to be more limited than flexion. ROM into ER is [facility] with pain at only end range. "Catching" sensation with both flexion and abduction. Additional exercises were added to her HEP to improve ROM and strength; pt was encouraged to continue with pulleys. Pt will continue to benefit from skilled PT to address limitations in strength, ROM and functional mobility with decreased pain and improved QOL.    Personal Factors and Comorbidities Age;Comorbidity 1;Fitness;Past/Current Experience;Time since onset of injury/illness/exacerbation    Comorbidities pre-diabetes    Examination-Activity Limitations Carry;Lift;Reach Overhead;Sleep    Examination-Participation Restrictions Occupation;Driving;Community Activity;Cleaning;Laundry;Meal Prep    Stability/Clinical Decision Making Evolving/Moderate complexity    Rehab Potential Good    PT Frequency 2x / week    PT Duration 12 weeks    PT Treatment/Interventions ADLs/Self Care Home Management;Aquatic Therapy;Electrical Stimulation;Moist Heat;Ultrasound;DME Instruction;Functional mobility training;Therapeutic activities;Therapeutic exercise;Neuromuscular re-education;Patient/family education;Manual techniques;Passive range of motion;Energy conservation;Taping;Vasopneumatic Device;Joint Manipulations;Cryotherapy;Traction;Dry needling    PT Next Visit Plan AAROM progression- continued AROM progression; parascapular strengthening    PT Home Exercise Plan pulleys abd and flex, bilat ER RTB, AROM flex and abd, wall slides             Patient will benefit from skilled therapeutic intervention in order  to improve the following deficits and impairments:  Decreased range of motion, Improper body mechanics, Decreased activity tolerance, Decreased strength, Impaired UE  functional use, Decreased mobility, Difficulty walking, Decreased endurance, Postural dysfunction, Increased muscle spasms, Impaired flexibility, Impaired tone, Hypomobility, Increased fascial restricitons, Pain  Visit Diagnosis: Acute pain of left shoulder  Muscle weakness (generalized)  Stiffness of left shoulder, not elsewhere classified     Problem List Patient Active Problem List   Diagnosis Date Noted   Screen for colon cancer    Muscle cramps 05/28/2018   Pre-diabetes 05/28/2018   Fatty liver 05/28/2018   Hair loss 05/28/2018   Sciatica 11/01/2015   S/P right oophorectomy 11/01/2015   S/P hysterectomy 11/01/2015    Patrina Levering PT, DPT   [facility] [address] [place], [address] Phone: [phone]   Fax:  [phone]  Name: Carolyn Sampson MRN: [id number] Date of Birth: [birth year]

## 2021-11-24 ENCOUNTER — Encounter: Payer: Self-pay | Admitting: Physical Therapy

## 2021-11-24 ENCOUNTER — Other Ambulatory Visit: Payer: Self-pay

## 2021-11-24 ENCOUNTER — Ambulatory Visit: Payer: PRIVATE HEALTH INSURANCE | Admitting: Physical Therapy

## 2021-11-24 DIAGNOSIS — M25512 Pain in left shoulder: Secondary | ICD-10-CM | POA: Diagnosis not present

## 2021-11-24 DIAGNOSIS — M6281 Muscle weakness (generalized): Secondary | ICD-10-CM

## 2021-11-24 DIAGNOSIS — M25612 Stiffness of left shoulder, not elsewhere classified: Secondary | ICD-10-CM

## 2021-11-24 NOTE — Therapy (Signed)
[facility] [address], [place], [address] Phone: [phone]   Fax:  [phone]  Physical Therapy Treatment  Patient Details  Name: Carolyn Sampson MRN: [id number] Date of Birth: [birth year] Referring Provider (PT): Tania Ade MD   Encounter Date: 11/24/2021   PT End of Session - 11/24/21 1352     Visit Number 16    Number of Visits 17    Date for PT Re-Evaluation 12/29/21    Authorization - Visit Number 16    Authorization - Number of Visits 10    Progress Note Due on Visit 10    PT Start Time 1315    PT Stop Time 1400    PT Time Calculation (min) 45 min    Activity Tolerance Patient tolerated treatment well    Behavior During Therapy [facility] for tasks assessed/performed             Past Medical History:  Diagnosis Date   Fatty liver    Pre-diabetes     Past Surgical History:  Procedure Laterality Date   ABDOMINAL HYSTERECTOMY     APPENDECTOMY     CESAREAN SECTION     CESAREAN SECTION     COLONOSCOPY WITH PROPOFOL N/A 06/02/2021   Procedure: COLONOSCOPY WITH PROPOFOL;  Surgeon: Lin Landsman, MD;  Location: [facility];  Service: Gastroenterology;  Laterality: N/A;  SPANISH INTERPRETER   LAPAROSCOPIC HYSTERECTOMY     LAPAROSCOPIC OOPHERECTOMY Right    SHOULDER ARTHROSCOPY WITH ROTATOR CUFF REPAIR AND SUBACROMIAL DECOMPRESSION Left 08/22/2021   Procedure: SHOULDER ARTHROSCOPY WITH ROTATOR CUFF REPAIR AND SUBACROMIAL DECOMPRESSION;  Surgeon: Tania Ade, MD;  Location: [facility];  Service: Orthopedics;  Laterality: Left;    There were no vitals filed for this visit.   Subjective Assessment - 11/24/21 1305     Subjective Pnt reports 4/10 pain in left shoulder today but feels her HEP is helping with overall ROM    Patient is accompained by: Interpreter    Pertinent History Pt is a 61 year old female presenting s/p L shoulder arthroscopy supraspinatus repair  and subacromial decompression 08/22/21. Reports post surgical pain has been 9/10 since surgery. Is in a sling currently all hours of the day other than to shower. Has returned to surgeon 09/01/21 and got a good report. Has another appt scheduled for follow up in January. Reports incision has no drainage, but it is very senstivie to light touch. 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 In addition to full time work she reports walking 74mns 3-4x/week and she would like to return to this.  Pt reports she would like to return to her job after rehab but her boss has already told her she no longer has a job. Post surgical pain is as high as 9/10 with trunk rotation and trying to change clothes; and as low (and currently) 0/10. Pain is somewhat alleiviated by ice and pain medication. Patient lives with daughter who has been helping her though she works a lot (pt is alone often), has about 4 steps to enter 1 story home. Patient is currently not driving, daughter drives her to appts, her daughter makes meals and she is able to feed herself independently, is bathing modI ind standig in walkin shower soley with R hand, daughter fixing her hair for her, and dressing modI with no one else assisting her physically but she  needs to prop her arm with her other had and it is difficult. Sleeps in her bed on her back, with several pillows around her for comfort and to support her arm. Pt denies N/V, B&B changes, unexplained weight fluctuation, saddle paresthesia, fever, night sweats, or unrelenting night pain at this time.    Limitations Lifting;Reading;Writing;Walking;House hold activities    How long can you sit comfortably? unlimited    How long can you stand comfortably? unlimited    How long can you walk comfortably? unlimited    Diagnostic tests none post surgery    Patient Stated Goals restore motion/strength in shoulder in order to complete normal  household tasks    Currently in Pain? Yes    Pain Score 4     Pain Location Shoulder    Pain Orientation Left    Pain Descriptors / Indicators Sharp    Pain Type Surgical pain    Pain Onset 1 to 4 weeks ago    Pain Frequency Intermittent             Therex Pulleys -2 min flexion and 2 min abd- good carryover from last session Wall Slides with foam roller 3 x 12- min cueing for full thoracic extension Supine AAROM shoulder flexion with dowel and 5 pound weight to encourage end range stretch with ice pack applied to left shoulder 3 x 10  Supine AAROM shoulder abduction with dowel and ice pack 3 x10 - mod tactile cueing for proper hand placement  Pendulums 3 x 12  Manual Therapy  A-P shoulder mobs 3 x 30 sec with ice pack  Sup-Inf shoulder mobs 3 x 30 sec with ice pack  10 min total   Frequent rest breaks required due to aggravation of pain with all mobilizations and active movement.         PT Education - 11/24/21 1352     Education Details therex form and pain education    Person(s) Educated Patient    Methods Explanation;Demonstration;Tactile cues;Verbal cues    Comprehension Verbalized understanding;Returned demonstration              PT Short Term Goals - 11/03/21 1304       PT SHORT TERM GOAL #1   Title Pt will be independent with HEP in order to improve strength and decrease pain in order to improve pain-free function at home and work.    Baseline 09/08/21 HEP given    Time 4    Period Weeks    Status Achieved      PT SHORT TERM GOAL #2   Title Patient will increase FOTO score to equal to or greater than   53  to demonstrate statistically significant improvement in mobility and quality of life.    Time 4    Period Weeks      PT SHORT TERM GOAL #3   Title Pt will improve shoulder flexion and abduction AROM by 15 degrees each indicationg progress with funcitonal shoulder elevation    Baseline 80ABD, 105 Flexion at IE, 50 abd and 50 flex at PN 10/10.  11/03/21: flexion:135, abduction 105    Time 4    Period Weeks    Status Achieved               PT Long Term Goals - 11/03/21 1314       PT LONG TERM GOAL #1   Title Patient will increase FOTO score to 44 to demonstrate predicted increase in functional mobility to complete  ADLs    Baseline 09/08/21 4    Time 12    Period Weeks      PT LONG TERM GOAL #2   Title Pt will decrease worst pain as reported on NPRS by at least 3 points in order to demonstrate clinically significant reduction in pain.    Baseline 09/08/21 9/10. 11/03/21 5/10 NPS    Time 12    Period Weeks    Status Achieved      PT LONG TERM GOAL #3   Title Pt will demonstrate full active L shoulder motion in order to complete self care ADLS    Baseline 11/07/83 unable to attempt. 11/03/21 limited    Time 12    Period Weeks    Status Not Met      PT LONG TERM GOAL #4   Title Patient will demonstrate gross L shoulder MMTs of 4+/5 in order to be able to complete heavy household tasks    Baseline flexion 3+/5, abduction 4/5 in 20 degrees of abduction painfree range, extension 5/5 in pain free rane, IR 4/5, ER 3/5    Time 12    Period Weeks    Status Partially Met                   Plan - 11/24/21 1356     Clinical Impression Statement Pnt presented with sharp 4/10 pain on NPS in left shoulder. SPT used cryotherapy throughout session to reduce pain, and pnt was able to comply to therex cueing. SPT also incorporated grade 2 shoulder mobilizations to decrease pain perception of pnt. Pt remained guarded throughout shoulder mobilizations and ROM techniques. Following manual therapy, pnt was able to complete parascapular strengthening therex and AAROM to increase left glenohumeral flexion and abduction. Breathing techniques utilized during AAROM to calm the nervous system and assist with pain management. PT will continue as able.    Personal Factors and Comorbidities Age;Comorbidity 1;Fitness;Past/Current Experience;Time  since onset of injury/illness/exacerbation    Comorbidities pre-diabetes    Examination-Activity Limitations Carry;Lift;Reach Overhead;Sleep    Examination-Participation Restrictions Occupation;Driving;Community Activity;Cleaning;Laundry;Meal Prep    Stability/Clinical Decision Making Evolving/Moderate complexity    Clinical Decision Making Moderate    Rehab Potential Good    PT Frequency 2x / week    PT Duration 12 weeks    PT Treatment/Interventions ADLs/Self Care Home Management;Aquatic Therapy;Electrical Stimulation;Moist Heat;Ultrasound;DME Instruction;Functional mobility training;Therapeutic activities;Therapeutic exercise;Neuromuscular re-education;Patient/family education;Manual techniques;Passive range of motion;Energy conservation;Taping;Vasopneumatic Device;Joint Manipulations;Cryotherapy;Traction;Dry needling    PT Next Visit Plan AAROM progression- continued AROM progression; parascapular strengthening    PT Home Exercise Plan pulleys abd and flex, bilat ER RTB, AROM flex and abd, wall slides    Consulted and Agree with Plan of Care Patient             Patient will benefit from skilled therapeutic intervention in order to improve the following deficits and impairments:  Decreased range of motion, Improper body mechanics, Decreased activity tolerance, Decreased strength, Impaired UE functional use, Decreased mobility, Difficulty walking, Decreased endurance, Postural dysfunction, Increased muscle spasms, Impaired flexibility, Impaired tone, Hypomobility, Increased fascial restricitons, Pain  Visit Diagnosis: Acute pain of left shoulder  Muscle weakness (generalized)  Stiffness of left shoulder, not elsewhere classified     Problem List Patient Active Problem List   Diagnosis Date Noted   Screen for colon cancer    Muscle cramps 05/28/2018   Pre-diabetes 05/28/2018   Fatty liver 05/28/2018   Hair loss 05/28/2018   Sciatica 11/01/2015   S/P right  oophorectomy  11/01/2015   S/P hysterectomy 11/01/2015   Claiborne Billings O'Daniel, SPT  Patrina Levering PT, DPT   [facility] [address], [place], [address] Phone: [phone]   Fax:  [phone]  Name: Carolyn Sampson MRN: [id number] Date of Birth: 07-02-1961

## 2021-11-29 ENCOUNTER — Ambulatory Visit: Payer: PRIVATE HEALTH INSURANCE | Attending: Orthopedic Surgery | Admitting: Physical Therapy

## 2021-11-29 ENCOUNTER — Encounter: Payer: Self-pay | Admitting: Physical Therapy

## 2021-11-29 ENCOUNTER — Other Ambulatory Visit: Payer: Self-pay

## 2021-11-29 DIAGNOSIS — M25512 Pain in left shoulder: Secondary | ICD-10-CM | POA: Insufficient documentation

## 2021-11-29 DIAGNOSIS — M6281 Muscle weakness (generalized): Secondary | ICD-10-CM | POA: Diagnosis present

## 2021-11-29 DIAGNOSIS — M25612 Stiffness of left shoulder, not elsewhere classified: Secondary | ICD-10-CM | POA: Diagnosis present

## 2021-11-29 NOTE — Therapy (Signed)
[facility] [address] [place], [address] Phone: [phone]   Fax:  [phone]  Physical Therapy Treatment/Physical Therapy Re-certification Note   Dates of reporting period  09/08/21   to   11/29/21.  Patient Details  Name: Carolyn Sampson MRN: [id number] Date of Birth: [birth year] Referring Provider (PT): Tania Ade MD   Encounter Date: 11/29/2021   PT End of Session - 11/29/21 1533     Visit Number 17    Number of Visits 29    Date for PT Re-Evaluation 01/10/22    Authorization Type WC    Progress Note Due on Visit 20    PT Start Time 1302    PT Stop Time 1346    PT Time Calculation (min) 44 min    Activity Tolerance Patient tolerated treatment well    Behavior During Therapy WFL for tasks assessed/performed             Past Medical History:  Diagnosis Date   Fatty liver    Pre-diabetes     Past Surgical History:  Procedure Laterality Date   ABDOMINAL HYSTERECTOMY     APPENDECTOMY     CESAREAN SECTION     CESAREAN SECTION     COLONOSCOPY WITH PROPOFOL N/A 06/02/2021   Procedure: COLONOSCOPY WITH PROPOFOL;  Surgeon: Lin Landsman, MD;  Location: [facility];  Service: Gastroenterology;  Laterality: N/A;  SPANISH INTERPRETER   LAPAROSCOPIC HYSTERECTOMY     LAPAROSCOPIC OOPHERECTOMY Right    SHOULDER ARTHROSCOPY WITH ROTATOR CUFF REPAIR AND SUBACROMIAL DECOMPRESSION Left 08/22/2021   Procedure: SHOULDER ARTHROSCOPY WITH ROTATOR CUFF REPAIR AND SUBACROMIAL DECOMPRESSION;  Surgeon: Tania Ade, MD;  Location: [facility];  Service: Orthopedics;  Laterality: Left;    There were no vitals filed for this visit.   Subjective Assessment - 11/29/21 1532     Subjective Pt denies pain at rest however reports pain with all planes of movement. Reports compliance with HEP.    Patient is accompained by: Interpreter    Pertinent History Pt is a 61 year old female  presenting s/p L shoulder arthroscopy supraspinatus repair and subacromial decompression 08/22/21. Reports post surgical pain has been 9/10 since surgery. Is in a sling currently all hours of the day other than to shower. Has returned to surgeon 09/01/21 and got a good report. Has another appt scheduled for follow up in January. Reports incision has no drainage, but it is very senstivie to light touch. 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 In addition to full time work she reports walking 47mins 3-4x/week and she would like to return to this.  Pt reports she would like to return to her job after rehab but her boss has already told her she no longer has a job. Post surgical pain is as high as 9/10 with trunk rotation and trying to change clothes; and as low (and currently) 0/10. Pain is somewhat alleiviated by ice and pain medication. Patient lives with daughter who has been helping her though she works a lot (pt is alone often), has about 4 steps to enter 1 story home. Patient is currently not driving, daughter drives her to appts, her daughter makes meals and she is able to feed herself independently, is bathing modI ind standig in walkin shower soley with R hand, daughter fixing her hair for her, and dressing modI with no one  else assisting her physically but she needs to prop her arm with her other had and it is difficult. Sleeps in her bed on her back, with several pillows around her for comfort and to support her arm. Pt denies N/V, B&B changes, unexplained weight fluctuation, saddle paresthesia, fever, night sweats, or unrelenting night pain at this time.    Limitations Lifting;Reading;Writing;Walking;House hold activities    How long can you sit comfortably? unlimited    How long can you stand comfortably? unlimited    How long can you walk comfortably? unlimited    Diagnostic tests none post surgery    Patient Stated Goals restore  motion/strength in shoulder in order to complete normal household tasks    Currently in Pain? No/denies    Pain Onset 1 to 4 weeks ago             LEFT SHOULDER   Goals L GH flexion - active 110, passive 120 L GH abduction - 85 (active, pt refusing OP due to pain) L GH - ER 55 (active, pt refusing OP due to pain) General 3+ MMT however through partial ROM  Therex **ice pack donned for all supine therex and manual**  Arm ergometer 4 min forward for gentle shoulder mobilization   -pain with backwards Supine AAROM shoulder flexion with dowel, 3 x 10  Supine AAROM chest press with dowel, 3 x 10 Supine AAROM shoulder abduction with dowel, 3 x10 - mod tactile cueing for proper hand placement  Pendulums 3 x 12   Manual Therapy  A-P shoulder mobs 3 x 30 sec through various angles   Sup-Inf shoulder mobs 3 x 30 sec through various angles  PROM with mild OP (to pt tolerance) in all planes  10 min total    Frequent rest breaks required due to aggravation of pain with all mobilizations, passive and active movement.   Clinical Impression: Pt sessions appear to be progressively more limited by pain. Left GH ROM measurements were taken - global range has decreased from previous measurements 4 weeks ago. Pt reports active and passive ROM feel better in seated and standing positions compared to supine. PT plans to alter POC to seated and standing positions in future sessions. Sharp pain noted at end range and mid-range. Pt will continue to benefit from skilled PT to address limitations in strength, ROM and functional mobility with decreased pain and improved QOL.  Patient's condition has the potential to improve in response to therapy. Maximum improvement is yet to be obtained. The anticipated improvement is attainable and reasonable in a generally predictable time.           PT Short Term Goals - 11/29/21 1628       PT SHORT TERM GOAL #1   Title Pt will be independent with HEP  in order to improve strength and decrease pain in order to improve pain-free function at home and work.    Baseline 09/08/21 HEP given    Time 4    Period Weeks    Status Achieved      PT SHORT TERM GOAL #2   Title Patient will increase FOTO score to equal to or greater than   53  to demonstrate statistically significant improvement in mobility and quality of life.      PT SHORT TERM GOAL #3   Title Pt will improve shoulder flexion and abduction AROM by 15 degrees each indicationg progress with functional shoulder elevation    Baseline 80ABD, 105 Flexion at IE,  50 abd and 50 flex at PN 10/10. 11/03/21: flexion:135, abduction 105. 11/29/21: flexion 110, abduction, 85, ER 55.    Time 6    Period Weeks    Status Achieved    Target Date 01/10/22               PT Long Term Goals - 11/29/21 1633       PT LONG TERM GOAL #1   Title Patient will increase FOTO score to 44 to demonstrate predicted increase in functional mobility to complete ADLs    Baseline 09/08/21 4; 11/29/21: will assess next sesison    Time 6    Period Weeks    Status On-going    Target Date 01/10/22      PT LONG TERM GOAL #2   Title Pt will decrease worst pain as reported on NPRS by at least 3 points in order to demonstrate clinically significant reduction in pain.    Baseline 09/08/21 9/10. 11/03/21 5/10 NPS    Time 12    Period Weeks    Status Achieved    Target Date 09/05/21      PT LONG TERM GOAL #3   Title Pt will demonstrate full active L shoulder motion in order to complete self care ADLS    Baseline 19/5/09 unable to attempt. 11/03/21 limited. 11/29/21: flexion 110, abduction 85, ER 55    Time 6    Period Weeks    Status On-going    Target Date 01/10/22      PT LONG TERM GOAL #4   Title Patient will demonstrate gross L shoulder MMTs of 4+/5 in order to be able to complete heavy household tasks    Baseline flexion 3+/5, abduction 4/5 in 20 degrees of abduction painfree range, extension 5/5 in pain free rane,  IR 4/5, ER 3/5. 11/29/21: 3 to 3+/5 flexion, abduction and ER (partial ROM)    Time 6    Period Weeks    Status On-going    Target Date 01/10/22                   Plan - 11/29/21 1627     Clinical Impression Statement Pt sessions appear to be progressively more limited by pain. Left GH ROM measurements were taken - global range has decreased from previous measurements 4 weeks ago. Pt reports active and passive ROM feel better in seated and standing positions compared to supine. PT plans to alter POC to seated and standing positions in future sessions. Sharp pain noted at end range and mid-range. Pt will continue to benefit from skilled PT to address limitations in strength, ROM and functional mobility with decreased pain and improved QOL.    Personal Factors and Comorbidities Age;Comorbidity 1;Fitness;Past/Current Experience;Time since onset of injury/illness/exacerbation    Comorbidities pre-diabetes    Examination-Activity Limitations Carry;Lift;Reach Overhead;Sleep    Examination-Participation Restrictions Occupation;Driving;Community Activity;Cleaning;Laundry;Meal Prep    Stability/Clinical Decision Making Evolving/Moderate complexity    Rehab Potential Good    PT Frequency 2x / week    PT Duration 12 weeks    PT Treatment/Interventions ADLs/Self Care Home Management;Aquatic Therapy;Electrical Stimulation;Moist Heat;Ultrasound;DME Instruction;Functional mobility training;Therapeutic activities;Therapeutic exercise;Neuromuscular re-education;Patient/family education;Manual techniques;Passive range of motion;Energy conservation;Taping;Vasopneumatic Device;Joint Manipulations;Cryotherapy;Traction;Dry needling    PT Next Visit Plan AAROM progression- continued AROM progression; parascapular strengthening    PT Home Exercise Plan pulleys abd and flex, bilat ER RTB, AROM flex and abd, wall slides    Consulted and Agree with Plan of Care Patient  Patient will benefit  from skilled therapeutic intervention in order to improve the following deficits and impairments:  Decreased range of motion, Improper body mechanics, Decreased activity tolerance, Decreased strength, Impaired UE functional use, Decreased mobility, Difficulty walking, Decreased endurance, Postural dysfunction, Increased muscle spasms, Impaired flexibility, Impaired tone, Hypomobility, Increased fascial restricitons, Pain  Visit Diagnosis: Acute pain of left shoulder  Muscle weakness (generalized)  Stiffness of left shoulder, not elsewhere classified     Problem List Patient Active Problem List   Diagnosis Date Noted   Screen for colon cancer    Muscle cramps 05/28/2018   Pre-diabetes 05/28/2018   Fatty liver 05/28/2018   Hair loss 05/28/2018   Sciatica 11/01/2015   S/P right oophorectomy 11/01/2015   S/P hysterectomy 11/01/2015    Patrina Levering PT, DPT   [facility] [address] [place], [address] Phone: [phone]   Fax:  [phone]  Name: Carolyn Sampson MRN: [id number] Date of Birth: 13-Jul-1961

## 2021-12-01 ENCOUNTER — Ambulatory Visit: Payer: PRIVATE HEALTH INSURANCE | Attending: Orthopedic Surgery | Admitting: Physical Therapy

## 2021-12-01 ENCOUNTER — Other Ambulatory Visit: Payer: Self-pay

## 2021-12-01 ENCOUNTER — Encounter: Payer: Self-pay | Admitting: Physical Therapy

## 2021-12-01 DIAGNOSIS — M25512 Pain in left shoulder: Secondary | ICD-10-CM | POA: Insufficient documentation

## 2021-12-01 DIAGNOSIS — M6281 Muscle weakness (generalized): Secondary | ICD-10-CM | POA: Insufficient documentation

## 2021-12-01 DIAGNOSIS — M25612 Stiffness of left shoulder, not elsewhere classified: Secondary | ICD-10-CM | POA: Insufficient documentation

## 2021-12-01 NOTE — Therapy (Signed)
[facility] [address] [place], [address] Phone: [phone]   Fax:  [phone]  Physical Therapy Treatment  Patient Details  Name: Carolyn Sampson MRN: [id number] Date of Birth: March 02, 1961 Referring Provider (PT): Tania Ade MD   Encounter Date: 12/01/2021   PT End of Session - 12/01/21 1548     Visit Number 18    Number of Visits 29    Date for PT Re-Evaluation 01/10/22    Authorization Type WC    Authorization - Visit Number --    Authorization - Number of Visits --    Progress Note Due on Visit 20    PT Start Time 1430    PT Stop Time 1515    PT Time Calculation (min) 45 min    Activity Tolerance Patient tolerated treatment well    Behavior During Therapy [facility] for tasks assessed/performed             Past Medical History:  Diagnosis Date   Fatty liver    Pre-diabetes     Past Surgical History:  Procedure Laterality Date   ABDOMINAL HYSTERECTOMY     APPENDECTOMY     CESAREAN SECTION     CESAREAN SECTION     COLONOSCOPY WITH PROPOFOL N/A 06/02/2021   Procedure: COLONOSCOPY WITH PROPOFOL;  Surgeon: Lin Landsman, MD;  Location: [facility];  Service: Gastroenterology;  Laterality: N/A;  SPANISH INTERPRETER   LAPAROSCOPIC HYSTERECTOMY     LAPAROSCOPIC OOPHERECTOMY Right    SHOULDER ARTHROSCOPY WITH ROTATOR CUFF REPAIR AND SUBACROMIAL DECOMPRESSION Left 08/22/2021   Procedure: SHOULDER ARTHROSCOPY WITH ROTATOR CUFF REPAIR AND SUBACROMIAL DECOMPRESSION;  Surgeon: Tania Ade, MD;  Location: [facility];  Service: Orthopedics;  Laterality: Left;    There were no vitals filed for this visit.   Subjective Assessment - 12/01/21 1547     Subjective Pnt denies pain and reports compliance with HEP    Patient is accompained by: Interpreter    Pertinent History Pt is a 61 year old female presenting s/p L shoulder arthroscopy supraspinatus repair and subacromial  decompression 08/22/21. Reports post surgical pain has been 9/10 since surgery. Is in a sling currently all hours of the day other than to shower. Has returned to surgeon 09/01/21 and got a good report. Has another appt scheduled for follow up in January. Reports incision has no drainage, but it is very senstivie to light touch. 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 In addition to full time work she reports walking 3mins 3-4x/week and she would like to return to this.  Pt reports she would like to return to her job after rehab but her boss has already told her she no longer has a job. Post surgical pain is as high as 9/10 with trunk rotation and trying to change clothes; and as low (and currently) 0/10. Pain is somewhat alleiviated by ice and pain medication. Patient lives with daughter who has been helping her though she works a lot (pt is alone often), has about 4 steps to enter 1 story home. Patient is currently not driving, daughter drives her to appts, her daughter makes meals and she is able to feed herself independently, is bathing modI ind standig in walkin shower soley with R hand, daughter fixing her hair for her, and dressing modI with no one else assisting her physically but she needs to prop  her arm with her other had and it is difficult. Sleeps in her bed on her back, with several pillows around her for comfort and to support her arm. Pt denies N/V, B&B changes, unexplained weight fluctuation, saddle paresthesia, fever, night sweats, or unrelenting night pain at this time.    Limitations Lifting;Reading;Writing;Walking;House hold activities    How long can you sit comfortably? unlimited    How long can you stand comfortably? unlimited    How long can you walk comfortably? unlimited    Diagnostic tests none post surgery    Patient Stated Goals restore motion/strength in shoulder in order to complete normal household tasks     Currently in Pain? No/denies              Therex Arm Bike for 4 min forward  AROM shoulder abduction 3 x 6- min VC to relax UT  AROM shoulder flexion 3 x 6- min VC to relax UT Wall Shoulder Flexion 2 x 12- min cueing to lean into the stretch  Pendulums 2 x 12   Electrical Stimulation - applied setting for "Acute Pain Modulation" with intensity set to 12   Left Shoulder AROM post E-Stim:  Flexion 140 degrees abduction: 140 degrees          PT Education - 12/01/21 1547     Education Details pain education and therex form    Person(s) Educated Patient    Methods Explanation;Tactile cues;Verbal cues;Demonstration    Comprehension Verbalized understanding;Returned demonstration              PT Short Term Goals - 11/29/21 1628       PT SHORT TERM GOAL #1   Title Pt will be independent with HEP in order to improve strength and decrease pain in order to improve pain-free function at home and work.    Baseline 09/08/21 HEP given    Time 4    Period Weeks    Status Achieved      PT SHORT TERM GOAL #2   Title Patient will increase FOTO score to equal to or greater than   53  to demonstrate statistically significant improvement in mobility and quality of life.      PT SHORT TERM GOAL #3   Title Pt will improve shoulder flexion and abduction AROM by 15 degrees each indicationg progress with functional shoulder elevation    Baseline 80ABD, 105 Flexion at IE, 50 abd and 50 flex at PN 10/10. 11/03/21: flexion:135, abduction 105. 11/29/21: flexion 110, abduction, 85, ER 55.    Time 6    Period Weeks    Status Achieved    Target Date 01/10/22               PT Long Term Goals - 11/29/21 1633       PT LONG TERM GOAL #1   Title Patient will increase FOTO score to 44 to demonstrate predicted increase in functional mobility to complete ADLs    Baseline 09/08/21 4; 11/29/21: will assess next sesison    Time 6    Period Weeks    Status On-going    Target Date  01/10/22      PT LONG TERM GOAL #2   Title Pt will decrease worst pain as reported on NPRS by at least 3 points in order to demonstrate clinically significant reduction in pain.    Baseline 09/08/21 9/10. 11/03/21 5/10 NPS    Time 12    Period Weeks    Status Achieved  Target Date 09/05/21      PT LONG TERM GOAL #3   Title Pt will demonstrate full active L shoulder motion in order to complete self care ADLS    Baseline 54/0/08 unable to attempt. 11/03/21 limited. 11/29/21: flexion 110, abduction 85, ER 55    Time 6    Period Weeks    Status On-going    Target Date 01/10/22      PT LONG TERM GOAL #4   Title Patient will demonstrate gross L shoulder MMTs of 4+/5 in order to be able to complete heavy household tasks    Baseline flexion 3+/5, abduction 4/5 in 20 degrees of abduction painfree range, extension 5/5 in pain free rane, IR 4/5, ER 3/5. 11/29/21: 3 to 3+/5 flexion, abduction and ER (partial ROM)    Time 6    Period Weeks    Status On-going    Target Date 01/10/22                   Plan - 12/01/21 1549     Clinical Impression Statement Pnt presented with no pain today. She was able to achieve significantly more left shoulder ROM in frontal and saggital planes after beginning session with electrical stimulation, setting set for acute pain modulation - 140 degrees in flexion and abduction. Therex included multidirectional AROM in new available ROM. Pnt was able to comply with min verbal cueing and tolerated manual therapy well. PT will continue to incorporate E-Stim into sessions to decrease pnt pain perception and increase available ROM. Pnt will continue to bennefit  from skilled PT to address limitations in strength, ROM, and functional mobility.    Personal Factors and Comorbidities Age;Comorbidity 1;Fitness;Past/Current Experience;Time since onset of injury/illness/exacerbation    Comorbidities pre-diabetes    Examination-Activity Limitations Carry;Lift;Reach  Overhead;Sleep    Examination-Participation Restrictions Occupation;Driving;Community Activity;Cleaning;Laundry;Meal Prep    Stability/Clinical Decision Making Evolving/Moderate complexity    Clinical Decision Making Moderate    Rehab Potential Good    PT Frequency 2x / week    PT Duration 12 weeks    PT Treatment/Interventions ADLs/Self Care Home Management;Aquatic Therapy;Electrical Stimulation;Moist Heat;Ultrasound;DME Instruction;Functional mobility training;Therapeutic activities;Therapeutic exercise;Neuromuscular re-education;Patient/family education;Manual techniques;Passive range of motion;Energy conservation;Taping;Vasopneumatic Device;Joint Manipulations;Cryotherapy;Traction;Dry needling    PT Next Visit Plan continued AROM progression; parascapular strengthening    PT Home Exercise Plan pulleys abd and flex, bilat ER RTB, AROM flex and abd, wall slides    Consulted and Agree with Plan of Care Patient             Patient will benefit from skilled therapeutic intervention in order to improve the following deficits and impairments:  Decreased range of motion, Improper body mechanics, Decreased activity tolerance, Decreased strength, Impaired UE functional use, Decreased mobility, Difficulty walking, Decreased endurance, Postural dysfunction, Increased muscle spasms, Impaired flexibility, Impaired tone, Hypomobility, Increased fascial restricitons, Pain  Visit Diagnosis: Acute pain of left shoulder  Muscle weakness (generalized)  Stiffness of left shoulder, not elsewhere classified     Problem List Patient Active Problem List   Diagnosis Date Noted   Screen for colon cancer    Muscle cramps 05/28/2018   Pre-diabetes 05/28/2018   Fatty liver 05/28/2018   Hair loss 05/28/2018   Sciatica 11/01/2015   S/P right oophorectomy 11/01/2015   S/P hysterectomy 11/01/2015   Claiborne Billings O'Daniel, SPT  Patrina Levering PT, DPT   [facility] [address], [place], [address] Phone: [phone]   Fax:  [phone]  Name: Rayni Nemitz MRN: [id number] Date of Birth: 1961/07/29

## 2021-12-06 ENCOUNTER — Other Ambulatory Visit: Payer: Self-pay

## 2021-12-06 ENCOUNTER — Ambulatory Visit: Payer: PRIVATE HEALTH INSURANCE | Attending: Orthopedic Surgery | Admitting: Physical Therapy

## 2021-12-06 ENCOUNTER — Encounter: Payer: Self-pay | Admitting: Physical Therapy

## 2021-12-06 DIAGNOSIS — M6281 Muscle weakness (generalized): Secondary | ICD-10-CM | POA: Diagnosis present

## 2021-12-06 DIAGNOSIS — M25512 Pain in left shoulder: Secondary | ICD-10-CM | POA: Insufficient documentation

## 2021-12-06 DIAGNOSIS — M25612 Stiffness of left shoulder, not elsewhere classified: Secondary | ICD-10-CM | POA: Insufficient documentation

## 2021-12-06 NOTE — Therapy (Signed)
[facility] [address], [place], [address] Phone: [phone]   Fax:  [phone]  Physical Therapy Treatment  Patient Details  Name: Carolyn Sampson MRN: [id number] Date of Birth: April 13, 1961 Referring Provider (PT): Tania Ade MD   Encounter Date: 12/06/2021   PT End of Session - 12/06/21 1457     Visit Number 19    Number of Visits 29    Date for PT Re-Evaluation 01/10/22    Authorization Type WC    Progress Note Due on Visit 20    PT Start Time 1438    PT Stop Time 1520    PT Time Calculation (min) 42 min    Activity Tolerance Patient tolerated treatment well    Behavior During Therapy [facility] for tasks assessed/performed             Past Medical History:  Diagnosis Date   Fatty liver    Pre-diabetes     Past Surgical History:  Procedure Laterality Date   ABDOMINAL HYSTERECTOMY     APPENDECTOMY     CESAREAN SECTION     CESAREAN SECTION     COLONOSCOPY WITH PROPOFOL N/A 06/02/2021   Procedure: COLONOSCOPY WITH PROPOFOL;  Surgeon: Lin Landsman, MD;  Location: [facility];  Service: Gastroenterology;  Laterality: N/A;  SPANISH INTERPRETER   LAPAROSCOPIC HYSTERECTOMY     LAPAROSCOPIC OOPHERECTOMY Right    SHOULDER ARTHROSCOPY WITH ROTATOR CUFF REPAIR AND SUBACROMIAL DECOMPRESSION Left 08/22/2021   Procedure: SHOULDER ARTHROSCOPY WITH ROTATOR CUFF REPAIR AND SUBACROMIAL DECOMPRESSION;  Surgeon: Tania Ade, MD;  Location: [place];  Service: Orthopedics;  Laterality: Left;    There were no vitals filed for this visit.   Subjective Assessment - 12/06/21 1453     Subjective Patient denies pain at rest upon arrival, does endorse pain with movement. Concern/questions regarding temporary pain relief from e-stim last session (education provided). Compliant with HEP.    Patient is accompained by: Interpreter    Pertinent History Pt is a 61 year old female  presenting s/p L shoulder arthroscopy supraspinatus repair and subacromial decompression 08/22/21. Reports post surgical pain has been 9/10 since surgery. Is in a sling currently all hours of the day other than to shower. Has returned to surgeon 09/01/21 and got a good report. Has another appt scheduled for follow up in January. Reports incision has no drainage, but it is very senstivie to light touch. 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 In addition to full time work she reports walking 67mns 3-4x/week and she would like to return to this.  Pt reports she would like to return to her job after rehab but her boss has already told her she no longer has a job. Post surgical pain is as high as 9/10 with trunk rotation and trying to change clothes; and as low (and currently) 0/10. Pain is somewhat alleiviated by ice and pain medication. Patient lives with daughter who has been helping her though she works a lot (pt is alone often), has about 4 steps to enter 1 story home. Patient is currently not driving, daughter drives her to appts, her daughter makes meals and she is able to feed herself independently, is bathing modI ind standig in walkin shower soley with R hand, daughter fixing her hair for her, and dressing modI with no one else assisting her physically but she needs to  prop her arm with her other had and it is difficult. Sleeps in her bed on her back, with several pillows around her for comfort and to support her arm. Pt denies N/V, B&B changes, unexplained weight fluctuation, saddle paresthesia, fever, night sweats, or unrelenting night pain at this time.    Limitations Lifting;Reading;Writing;Walking;House hold activities    How long can you sit comfortably? unlimited    How long can you stand comfortably? unlimited    How long can you walk comfortably? unlimited    Diagnostic tests none post surgery    Patient Stated Goals restore  motion/strength in shoulder in order to complete normal household tasks    Currently in Pain? No/denies                Electrical Stimulation - pre-set setting for "Acute Pain Modulation"  Interferential, '4000Hz'$ , Freq 80/150 Hz, amplitude 8.2V, 10 minutes    Therex Arm Bike for 4 min forward, 1 minute backward (pain) AROM shoulder abduction 2x10  AROM shoulder flexion 2x10 Visual feedback using mirror paired with VC and TC to decrease UT activation and improve posture for AROM exercises.     Extensive pt education on purpose of electrical stimulation, how it works and temporary pain modulation following it's use. Pt misunderstood last session and was weary of using it again, asking why if the effects are not permanent.    This was explained via an interpreter.      Clinical Impression: Pt demonstrates good motivation within session. Extensive patient education provided on e-stim and use of it to allow for increased range and muscle activation due to decreased guarding immediately post-use. Pt eventually agreeable, again reporting it "feels good" and decreased pain following e-stim intervention. Pt performed AROM in frontal and sagittal planes with fatigue reported. Significant UT activation required multimodal cueing including visual feedback, VC and TC. Pt inquired about e-stim unit for home - PT shared a link to help guide pt in purchase of small unit and pads.  Pt will continue to benefit from skilled PT to address limitations in strength, ROM and functional mobility with decreased pain and improved QOL.            PT Short Term Goals - 11/29/21 1628       PT SHORT TERM GOAL #1   Title Pt will be independent with HEP in order to improve strength and decrease pain in order to improve pain-free function at home and work.    Baseline 09/08/21 HEP given    Time 4    Period Weeks    Status Achieved      PT SHORT TERM GOAL #2   Title Patient will increase FOTO score  to equal to or greater than   53  to demonstrate statistically significant improvement in mobility and quality of life.      PT SHORT TERM GOAL #3   Title Pt will improve shoulder flexion and abduction AROM by 15 degrees each indicationg progress with functional shoulder elevation    Baseline 80ABD, 105 Flexion at IE, 50 abd and 50 flex at PN 10/10. 11/03/21: flexion:135, abduction 105. 11/29/21: flexion 110, abduction, 85, ER 55.    Time 6    Period Weeks    Status Achieved    Target Date 01/10/22               PT Long Term Goals - 11/29/21 1633       PT LONG TERM GOAL #1   Title  Patient will increase FOTO score to 44 to demonstrate predicted increase in functional mobility to complete ADLs    Baseline 09/08/21 4; 11/29/21: will assess next sesison    Time 6    Period Weeks    Status On-going    Target Date 01/10/22      PT LONG TERM GOAL #2   Title Pt will decrease worst pain as reported on NPRS by at least 3 points in order to demonstrate clinically significant reduction in pain.    Baseline 09/08/21 9/10. 11/03/21 5/10 NPS    Time 12    Period Weeks    Status Achieved    Target Date 09/05/21      PT LONG TERM GOAL #3   Title Pt will demonstrate full active L shoulder motion in order to complete self care ADLS    Baseline 69/4/85 unable to attempt. 11/03/21 limited. 11/29/21: flexion 110, abduction 85, ER 55    Time 6    Period Weeks    Status On-going    Target Date 01/10/22      PT LONG TERM GOAL #4   Title Patient will demonstrate gross L shoulder MMTs of 4+/5 in order to be able to complete heavy household tasks    Baseline flexion 3+/5, abduction 4/5 in 20 degrees of abduction painfree range, extension 5/5 in pain free rane, IR 4/5, ER 3/5. 11/29/21: 3 to 3+/5 flexion, abduction and ER (partial ROM)    Time 6    Period Weeks    Status On-going    Target Date 01/10/22                   Plan - 12/06/21 1821     Clinical Impression Statement Pt demonstrates  good motivation within session. Extensive patient education provided on e-stim and use of it to allow for increased range and muscle activation due to decreased guarding immediately post-use. Pt eventually agreeable, again reporting it "feels good" and decreased pain following e-stim intervention. Pt performed AROM in frontal and sagittal planes with fatigue reported. Significant UT activation required multimodal cueing including visual feedback, VC and TC. Pt inquired about e-stim unit for home - PT shared a link to help guide pt in purchase of small unit and pads.  Pt will continue to benefit from skilled PT to address limitations in strength, ROM and functional mobility with decreased pain and improved QOL.    Personal Factors and Comorbidities Age;Comorbidity 1;Fitness;Past/Current Experience;Time since onset of injury/illness/exacerbation    Comorbidities pre-diabetes    Examination-Activity Limitations Carry;Lift;Reach Overhead;Sleep    Examination-Participation Restrictions Occupation;Driving;Community Activity;Cleaning;Laundry;Meal Prep    Stability/Clinical Decision Making Evolving/Moderate complexity    Rehab Potential Good    PT Frequency 2x / week    PT Duration 12 weeks    PT Treatment/Interventions ADLs/Self Care Home Management;Aquatic Therapy;Electrical Stimulation;Moist Heat;Ultrasound;DME Instruction;Functional mobility training;Therapeutic activities;Therapeutic exercise;Neuromuscular re-education;Patient/family education;Manual techniques;Passive range of motion;Energy conservation;Taping;Vasopneumatic Device;Joint Manipulations;Cryotherapy;Traction;Dry needling    PT Next Visit Plan continued AROM progression; parascapular strengthening    PT Home Exercise Plan pulleys abd and flex, bilat ER RTB, AROM flex and abd, wall slides    Consulted and Agree with Plan of Care Patient             Patient will benefit from skilled therapeutic intervention in order to improve the  following deficits and impairments:  Decreased range of motion, Improper body mechanics, Decreased activity tolerance, Decreased strength, Impaired UE functional use, Decreased mobility, Difficulty walking, Decreased endurance, Postural dysfunction,  Increased muscle spasms, Impaired flexibility, Impaired tone, Hypomobility, Increased fascial restricitons, Pain  Visit Diagnosis: Acute pain of left shoulder  Muscle weakness (generalized)  Stiffness of left shoulder, not elsewhere classified     Problem List Patient Active Problem List   Diagnosis Date Noted   Screen for colon cancer    Muscle cramps 05/28/2018   Pre-diabetes 05/28/2018   Fatty liver 05/28/2018   Hair loss 05/28/2018   Sciatica 11/01/2015   S/P right oophorectomy 11/01/2015   S/P hysterectomy 11/01/2015    Patrina Levering PT, DPT   [facility] [address] [place], [address] Phone: [phone]   Fax:  [phone]  Name: Emily Massar MRN: [id number] Date of Birth: [birth year]

## 2021-12-08 ENCOUNTER — Encounter: Payer: Self-pay | Admitting: Physical Therapy

## 2021-12-08 ENCOUNTER — Other Ambulatory Visit: Payer: Self-pay

## 2021-12-08 ENCOUNTER — Ambulatory Visit: Payer: PRIVATE HEALTH INSURANCE | Attending: Orthopedic Surgery | Admitting: Physical Therapy

## 2021-12-08 DIAGNOSIS — M25612 Stiffness of left shoulder, not elsewhere classified: Secondary | ICD-10-CM | POA: Diagnosis present

## 2021-12-08 DIAGNOSIS — M6281 Muscle weakness (generalized): Secondary | ICD-10-CM | POA: Diagnosis present

## 2021-12-08 DIAGNOSIS — M25512 Pain in left shoulder: Secondary | ICD-10-CM | POA: Diagnosis present

## 2021-12-08 NOTE — Therapy (Addendum)
[facility] [address], [place], [address] Phone: [phone]   Fax:  [phone]  Physical Therapy Treatment/Physical Therapy Progress Note   Dates of reporting period  10/27/21   to   12/08/21   Patient Details  Name: Carolyn Sampson MRN: [id number] Date of Birth: 07/13/1961 Referring Provider (PT): Tania Ade MD   Encounter Date: 12/08/2021   PT End of Session - 12/08/21 1558     Visit Number 20    Number of Visits 29    Date for PT Re-Evaluation 01/10/22    Authorization Type WC    Progress Note Due on Visit 20    PT Start Time 1346    PT Stop Time 1430    PT Time Calculation (min) 44 min    Activity Tolerance Patient tolerated treatment well    Behavior During Therapy [facility] for tasks assessed/performed             Past Medical History:  Diagnosis Date   Fatty liver    Pre-diabetes     Past Surgical History:  Procedure Laterality Date   ABDOMINAL HYSTERECTOMY     APPENDECTOMY     CESAREAN SECTION     CESAREAN SECTION     COLONOSCOPY WITH PROPOFOL N/A 06/02/2021   Procedure: COLONOSCOPY WITH PROPOFOL;  Surgeon: Lin Landsman, MD;  Location: [facility];  Service: Gastroenterology;  Laterality: N/A;  SPANISH INTERPRETER   LAPAROSCOPIC HYSTERECTOMY     LAPAROSCOPIC OOPHERECTOMY Right    SHOULDER ARTHROSCOPY WITH ROTATOR CUFF REPAIR AND SUBACROMIAL DECOMPRESSION Left 08/22/2021   Procedure: SHOULDER ARTHROSCOPY WITH ROTATOR CUFF REPAIR AND SUBACROMIAL DECOMPRESSION;  Surgeon: Tania Ade, MD;  Location: [place];  Service: Orthopedics;  Laterality: Left;    There were no vitals filed for this visit.   Subjective Assessment - 12/08/21 1356     Subjective Patient reports 1/10 pain upon arrival. Max pain over the past 2 days is 6/10. Compliant with HEP.    Patient is accompained by: Interpreter    Pertinent History Pt is a 61 year old female presenting s/p  L shoulder arthroscopy supraspinatus repair and subacromial decompression 08/22/21. Reports post surgical pain has been 9/10 since surgery. Is in a sling currently all hours of the day other than to shower. Has returned to surgeon 09/01/21 and got a good report. Has another appt scheduled for follow up in January. Reports incision has no drainage, but it is very senstivie to light touch. 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 In addition to full time work she reports walking 76mns 3-4x/week and she would like to return to this.  Pt reports she would like to return to her job after rehab but her boss has already told her she no longer has a job. Post surgical pain is as high as 9/10 with trunk rotation and trying to change clothes; and as low (and currently) 0/10. Pain is somewhat alleiviated by ice and pain medication. Patient lives with daughter who has been helping her though she works a lot (pt is alone often), has about 4 steps to enter 1 story home. Patient is currently not driving, daughter drives her to appts, her daughter makes meals and she is able to feed herself independently, is bathing modI ind standig in walkin shower soley with R hand, daughter fixing her hair for her, and dressing modI with  no one else assisting her physically but she needs to prop her arm with her other had and it is difficult. Sleeps in her bed on her back, with several pillows around her for comfort and to support her arm. Pt denies N/V, B&B changes, unexplained weight fluctuation, saddle paresthesia, fever, night sweats, or unrelenting night pain at this time.    Limitations Lifting;Reading;Writing;Walking;House hold activities    How long can you sit comfortably? unlimited    How long can you stand comfortably? unlimited    How long can you walk comfortably? unlimited    Diagnostic tests none post surgery    Patient Stated Goals restore motion/strength  in shoulder in order to complete normal household tasks    Currently in Pain? Yes    Pain Score 1     Pain Location Shoulder    Pain Orientation Left    Pain Descriptors / Indicators Throbbing    Pain Type Surgical pain;Acute pain    Pain Frequency Intermittent               PROGRESS NOTE - goals: FOTO - 43/44 MMT (4+/5) - incomplete ROM, 3/5 available range  AROM (full) - 110 degrees flexion, 88 degrees abduction in standing (following e-stim)   INTERVENTIONS  Electrical Stimulation - pre-set setting for "Acute Pain"  Interferential, '4000Hz'$ , Freq 80/150 Hz, amplitude 8.2V, 10 minutes      Therex AROM shoulder abduction 2x10  AROM shoulder flexion 3x10 (1st set full range, 2nd and 3rd to 90*) Bicep curl 2#, 3x10 Pendulums 3x30 seconds  AROM shoulder scaption, 3x10 (1st set full range, 2nd and 3rd to 80*) Left UT stretch, 3x30 seconds For all exercises: Visual feedback using mirror paired with VC and TC to decrease UT activation and improve posture.  Arm ergometer for 4 min forward.           Clinical Impression: Pt demonstrates good motivation within session. She is eager to begin session and continues to report that e-stim feels good as it decreases in the moment and temporarily afterwards. Pt was able to complete more AROM and strengthening this session compared to previous weeks. Attempted AROM through full range with pt grimacing in pain; reduced range to 90 degrees with improved pt performance and compliance with decreasing UT activation. Abduction ROM remains more limited than flexion. Unable to push on extremity for MMT due to pain sensitivity. Pt will continue to benefit from skilled PT to address limitations in strength, ROM and functional mobility with decreased pain and improved QOL.    Patient's condition has the potential to improve in response to therapy. Maximum improvement is yet to be obtained. The anticipated improvement is attainable and reasonable  in a generally predictable time.  Patient reports        PT Short Term Goals - 12/08/21 1551       PT SHORT TERM GOAL #1   Title Pt will be independent with HEP in order to improve strength and decrease pain in order to improve pain-free function at home and work.    Baseline 09/08/21 HEP given    Time 4    Period Weeks    Status Achieved      PT SHORT TERM GOAL #3   Title Pt will improve shoulder flexion and abduction AROM by 15 degrees each indicationg progress with functional shoulder elevation    Baseline 80ABD, 105 Flexion at IE, 50 abd and 50 flex at PN 10/10. 11/03/21: flexion:135, abduction 105. 11/29/21: flexion 110,  abduction, 85, ER 55. 12/08/21: F 110, abd 88    Time 6    Period Weeks    Status On-going    Target Date 01/10/22               PT Long Term Goals - 12/08/21 1553       PT LONG TERM GOAL #1   Title Patient will increase FOTO score to 44 to demonstrate predicted increase in functional mobility to complete ADLs    Baseline 09/08/21 4; 11/29/21: will assess next sesison; 12/08/21: 43    Time 6    Period Weeks    Status On-going    Target Date 01/10/22      PT LONG TERM GOAL #2   Title Pt will decrease worst pain as reported on NPRS by at least 3 points in order to demonstrate clinically significant reduction in pain.    Baseline 09/08/21 9/10. 11/03/21 5/10 NPS    Time 12    Period Weeks    Status Achieved    Target Date 09/05/21      PT LONG TERM GOAL #3   Title Pt will demonstrate full active L shoulder motion in order to complete self care ADLS    Baseline 96/7/59 unable to attempt. 11/03/21 limited. 11/29/21: flexion 110, abduction 85, ER 55. 12/08/21: F 110, abd 88    Time 6    Period Weeks    Status On-going    Target Date 01/10/22      PT LONG TERM GOAL #4   Title Patient will demonstrate gross L shoulder MMTs of 4+/5 in order to be able to complete heavy household tasks    Baseline flexion 3+/5, abduction 4/5 in 20 degrees of abduction painfree  range, extension 5/5 in pain free rane, IR 4/5, ER 3/5. 11/29/21: 3 to 3+/5 flexion, abduction and ER (partial ROM); 12/08/21: 3/5 MMT through incomplete range, unable to apply pressure due to pain.    Time 6    Period Weeks    Status On-going    Target Date 01/10/22                   Plan - 12/08/21 1551     Clinical Impression Statement Pt demonstrates good motivation within session. She is eager to begin session and continues to report that e-stim feels good as it decreases in the moment and temporarily afterwards. Pt was able to complete more AROM and strengthening this session compared to previous weeks. Attempted AROM through full range with pt grimacing in pain; reduced range to 90 degrees with improved pt performance and compliance with decreasing UT activation. Abduction ROM remains more limited than flexion. Unable to push on extremity for MMT due to pain sensitivity. Pt will continue to benefit from skilled PT to address limitations in strength, ROM and functional mobility with decreased pain and improved QOL.    Personal Factors and Comorbidities Age;Comorbidity 1;Fitness;Past/Current Experience;Time since onset of injury/illness/exacerbation    Comorbidities pre-diabetes    Examination-Activity Limitations Carry;Lift;Reach Overhead;Sleep    Examination-Participation Restrictions Occupation;Driving;Community Activity;Cleaning;Laundry;Meal Prep    Stability/Clinical Decision Making Evolving/Moderate complexity    Rehab Potential Good    PT Frequency 2x / week    PT Duration 12 weeks    PT Treatment/Interventions ADLs/Self Care Home Management;Aquatic Therapy;Electrical Stimulation;Moist Heat;Ultrasound;DME Instruction;Functional mobility training;Therapeutic activities;Therapeutic exercise;Neuromuscular re-education;Patient/family education;Manual techniques;Passive range of motion;Energy conservation;Taping;Vasopneumatic Device;Joint Manipulations;Cryotherapy;Traction;Dry  needling    PT Next Visit Plan continued AROM progression; parascapular strengthening  PT Home Exercise Plan pulleys abd and flex, bilat ER RTB, AROM flex and abd, wall slides    Consulted and Agree with Plan of Care Patient             Patient will benefit from skilled therapeutic intervention in order to improve the following deficits and impairments:  Decreased range of motion, Improper body mechanics, Decreased activity tolerance, Decreased strength, Impaired UE functional use, Decreased mobility, Difficulty walking, Decreased endurance, Postural dysfunction, Increased muscle spasms, Impaired flexibility, Impaired tone, Hypomobility, Increased fascial restricitons, Pain  Visit Diagnosis: Acute pain of left shoulder  Muscle weakness (generalized)  Stiffness of left shoulder, not elsewhere classified     Problem List Patient Active Problem List   Diagnosis Date Noted   Screen for colon cancer    Muscle cramps 05/28/2018   Pre-diabetes 05/28/2018   Fatty liver 05/28/2018   Hair loss 05/28/2018   Sciatica 11/01/2015   S/P right oophorectomy 11/01/2015   S/P hysterectomy 11/01/2015    Patrina Levering PT, DPT   [facility] [address] [place], [address] Phone: [phone]   Fax:  [phone]  Name: Carolyn Sampson MRN: [id number] Date of Birth: September 24, 1961

## 2021-12-13 ENCOUNTER — Encounter: Payer: Self-pay | Admitting: Physical Therapy

## 2021-12-13 ENCOUNTER — Other Ambulatory Visit: Payer: Self-pay

## 2021-12-13 ENCOUNTER — Ambulatory Visit: Payer: PRIVATE HEALTH INSURANCE | Admitting: Physical Therapy

## 2021-12-13 DIAGNOSIS — M25612 Stiffness of left shoulder, not elsewhere classified: Secondary | ICD-10-CM

## 2021-12-13 DIAGNOSIS — M6281 Muscle weakness (generalized): Secondary | ICD-10-CM

## 2021-12-13 DIAGNOSIS — M25512 Pain in left shoulder: Secondary | ICD-10-CM

## 2021-12-13 NOTE — Therapy (Signed)
[facility] ?[address] ?[place], [place], [address] ?Phone: [phone]   Fax:  [phone] ? ?Physical Therapy Treatment ? ?Patient Details  ?Name: Carolyn Sampson ?MRN: [id number] ?Date of Birth: 1960/11/05 ?Referring Provider (PT): Tania Ade MD ? ? ?Encounter Date: 12/13/2021 ? ? PT End of Session - 12/13/21 1310   ? ? Visit Number 21   ? Number of Visits 29   ? Date for PT Re-Evaluation 01/10/22   ? Authorization Type WC   ? Progress Note Due on Visit 20   ? PT Start Time 1301   ? PT Stop Time 7017   ? PT Time Calculation (min) 44 min   ? Activity Tolerance Patient tolerated treatment well   ? Behavior During Therapy [facility] for tasks assessed/performed   ? ?  ?  ? ?  ? ? ?Past Medical History:  ?Diagnosis Date  ? Fatty liver   ? Pre-diabetes   ? ? ?Past Surgical History:  ?Procedure Laterality Date  ? ABDOMINAL HYSTERECTOMY    ? APPENDECTOMY    ? CESAREAN SECTION    ? CESAREAN SECTION    ? COLONOSCOPY WITH PROPOFOL N/A 06/02/2021  ? Procedure: COLONOSCOPY WITH PROPOFOL;  Surgeon: Lin Landsman, MD;  Location: [facility];  Service: Gastroenterology;  Laterality: N/A;  [place] INTERPRETER  ? LAPAROSCOPIC HYSTERECTOMY    ? LAPAROSCOPIC OOPHERECTOMY Right   ? SHOULDER ARTHROSCOPY WITH ROTATOR CUFF REPAIR AND SUBACROMIAL DECOMPRESSION Left 08/22/2021  ? Procedure: SHOULDER ARTHROSCOPY WITH ROTATOR CUFF REPAIR AND SUBACROMIAL DECOMPRESSION;  Surgeon: Tania Ade, MD;  Location: [facility];  Service: Orthopedics;  Laterality: Left;  ? ? ?There were no vitals filed for this visit. ? ? Subjective Assessment - 12/13/21 1309   ? ? Subjective Patient denies pain this date. She does state that she had pain on saturday due to using it more than she is used to.   ? Patient is accompained by: Interpreter   ? Pertinent History Pt is a 61 year old female presenting s/p L shoulder arthroscopy supraspinatus repair and subacromial  decompression 08/22/21. Reports post surgical pain has been 9/10 since surgery. Is in a sling currently all hours of the day other than to shower. Has returned to surgeon 09/01/21 and got a good report. Has another appt scheduled for follow up in January. Reports incision has no drainage, but it is very senstivie to light touch. 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 In addition to full time work she reports walking 66mns 3-4x/week and she would like to return to this.  Pt reports she would like to return to her job after rehab but her boss has already told her she no longer has a job. Post surgical pain is as high as 9/10 with trunk rotation and trying to change clothes; and as low (and currently) 0/10. Pain is somewhat alleiviated by ice and pain medication. Patient lives with daughter who has been helping her though she works a lot (pt is alone often), has about 4 steps to enter 1 story home. Patient is currently not driving, daughter drives her to appts, her daughter makes meals and she is able to feed herself independently, is bathing modI ind standig in walkin shower soley with R hand, daughter fixing her hair for her, and dressing modI with no one else assisting her physically but she needs to prop her  arm with her other had and it is difficult. Sleeps in her bed on her back, with several pillows around her for comfort and to support her arm. Pt denies N/V, B&B changes, unexplained weight fluctuation, saddle paresthesia, fever, night sweats, or unrelenting night pain at this time.   ? Limitations Lifting;Reading;Writing;Walking;House hold activities   ? How long can you sit comfortably? unlimited   ? How long can you stand comfortably? unlimited   ? How long can you walk comfortably? unlimited   ? Diagnostic tests none post surgery   ? Patient Stated Goals restore motion/strength in shoulder in order to complete normal household tasks   ?  Currently in Pain? No/denies   ? ?  ?  ? ?  ? ? ? ? ? ? ? ?INTERVENTIONS ?  ?Electrical Stimulation ?- pre-set setting for "Acute Pain"  ?Interferential, '4000Hz'$ , Freq 80/150 Hz, amplitude 11V, ?10 minutes  ?  ?  ?Therex ?Standing in front of mirror, LUE only: ?AROM shoulder abduction 2x10;  ?AROM shoulder flexion 3x10 (through full available range); ?Bicep curl 2#, 3x10; ?Straight punch, 3x10; ?For all exercises: Visual feedback using mirror paired with VC and TC to decrease UT activation and improve posture.  ? ?Arm ergometer 4.5 min forward for gentle mobility and LUE endurance. ?  ?Pendulums multiple sets of 15-20 seconds performed throughout session for relaxation of shoulder;   ?  ?Next session ?Triceps   ?Abduction  ? ?  ?Clinical Impression: Pt demonstrates good motivation within session. E-stim utilized at beginning of sesson for pain modulation as pt seems to maintain decreased pain levels with exercises following. Active flexion range has improved however abduction (active, AA and passive) remains severely limited. Frequent cueing for eccentric control and body awareness throughout session. Mirror was used to assist with visual feedback. Pt will continue to benefit from skilled PT to address limitations in strength, ROM and functional mobility with decreased pain and improved QOL. ? ? ? ? ? ? ? ? ? PT Short Term Goals - 12/08/21 1551   ? ?  ? PT SHORT TERM GOAL #1  ? Title Pt will be independent with HEP in order to improve strength and decrease pain in order to improve pain-free function at home and work.   ? Baseline 09/08/21 HEP given   ? Time 4   ? Period Weeks   ? Status Achieved   ?  ? PT SHORT TERM GOAL #3  ? Title Pt will improve shoulder flexion and abduction AROM by 15 degrees each indicationg progress with functional shoulder elevation   ? Baseline 80ABD, 105 Flexion at IE, 50 abd and 50 flex at PN 10/10. 11/03/21: flexion:135, abduction 105. 11/29/21: flexion 110, abduction, 85, ER 55. 12/08/21: F  110, abd 88   ? Time 6   ? Period Weeks   ? Status On-going   ? Target Date 01/10/22   ? ?  ?  ? ?  ? ? ? ? PT Long Term Goals - 12/08/21 1553   ? ?  ? PT LONG TERM GOAL #1  ? Title Patient will increase FOTO score to 44 to demonstrate predicted increase in functional mobility to complete ADLs   ? Baseline 09/08/21 4; 11/29/21: will assess next sesison; 12/08/21: 43   ? Time 6   ? Period Weeks   ? Status On-going   ? Target Date 01/10/22   ?  ? PT LONG TERM GOAL #2  ? Title Pt will decrease worst pain as  reported on NPRS by at least 3 points in order to demonstrate clinically significant reduction in pain.   ? Baseline 09/08/21 9/10. 11/03/21 5/10 NPS   ? Time 12   ? Period Weeks   ? Status Achieved   ? Target Date 09/05/21   ?  ? PT LONG TERM GOAL #3  ? Title Pt will demonstrate full active L shoulder motion in order to complete self care ADLS   ? Baseline 09/08/21 unable to attempt. 11/03/21 limited. 11/29/21: flexion 110, abduction 85, ER 55. 12/08/21: F 110, abd 88   ? Time 6   ? Period Weeks   ? Status On-going   ? Target Date 01/10/22   ?  ? PT LONG TERM GOAL #4  ? Title Patient will demonstrate gross L shoulder MMTs of 4+/5 in order to be able to complete heavy household tasks   ? Baseline flexion 3+/5, abduction 4/5 in 20 degrees of abduction painfree range, extension 5/5 in pain free rane, IR 4/5, ER 3/5. 11/29/21: 3 to 3+/5 flexion, abduction and ER (partial ROM); 12/08/21: 3/5 MMT through incomplete range, unable to apply pressure due to pain.   ? Time 6   ? Period Weeks   ? Status On-going   ? Target Date 01/10/22   ? ?  ?  ? ?  ? ? ? ? ? ? ? ? Plan - 12/13/21 1803   ? ? Clinical Impression Statement Pt demonstrates good motivation within session. E-stim utilized at beginning of sesson for pain modulation as pt seems to maintain decreased pain levels with exercises following. Active flexion range has improved however abduction (active, AA and passive) remains severely limited. Frequent cueing for eccentric control and  body awareness throughout session. Mirror was used to assist with visual feedback. Pt will continue to benefit from skilled PT to address limitations in strength, ROM and functional mobility with decreased pain

## 2021-12-15 ENCOUNTER — Ambulatory Visit: Payer: PRIVATE HEALTH INSURANCE

## 2021-12-15 ENCOUNTER — Other Ambulatory Visit: Payer: Self-pay

## 2021-12-15 ENCOUNTER — Encounter: Payer: Self-pay | Admitting: Physical Therapy

## 2021-12-15 DIAGNOSIS — M25512 Pain in left shoulder: Secondary | ICD-10-CM | POA: Diagnosis not present

## 2021-12-15 NOTE — Therapy (Signed)
[facility] ?[address] ?[place], [place], [address] ?Phone: [phone]   Fax:  [phone] ? ?Physical Therapy Treatment ? ?Patient Details  ?Name: Carolyn Sampson ?MRN: [id number] ?Date of Birth: 02-Nov-1960 ?Referring Provider (PT): Tania Ade MD ? ? ?Encounter Date: 12/15/2021 ? ? PT End of Session - 12/15/21 1346   ? ? Visit Number 22   ? Number of Visits 29   ? Date for PT Re-Evaluation 01/10/22   ? Authorization Type WC   ? Progress Note Due on Visit 20   ? PT Start Time 1302   ? PT Stop Time 1346   ? PT Time Calculation (min) 44 min   ? Activity Tolerance Patient tolerated treatment well   ? Behavior During Therapy [facility] for tasks assessed/performed   ? ?  ?  ? ?  ? ? ?Past Medical History:  ?Diagnosis Date  ? Fatty liver   ? Pre-diabetes   ? ? ?Past Surgical History:  ?Procedure Laterality Date  ? ABDOMINAL HYSTERECTOMY    ? APPENDECTOMY    ? CESAREAN SECTION    ? CESAREAN SECTION    ? COLONOSCOPY WITH PROPOFOL N/A 06/02/2021  ? Procedure: COLONOSCOPY WITH PROPOFOL;  Surgeon: Lin Landsman, MD;  Location: [facility];  Service: Gastroenterology;  Laterality: N/A;  [place] INTERPRETER  ? LAPAROSCOPIC HYSTERECTOMY    ? LAPAROSCOPIC OOPHERECTOMY Right   ? SHOULDER ARTHROSCOPY WITH ROTATOR CUFF REPAIR AND SUBACROMIAL DECOMPRESSION Left 08/22/2021  ? Procedure: SHOULDER ARTHROSCOPY WITH ROTATOR CUFF REPAIR AND SUBACROMIAL DECOMPRESSION;  Surgeon: Tania Ade, MD;  Location: [facility];  Service: Orthopedics;  Laterality: Left;  ? ? ?There were no vitals filed for this visit. ? ? Subjective Assessment - 12/15/21 1345   ? ? Subjective Pt denies pain. Reports weakness with holding items like dishes out in front of her. Has been happy with how PT has helped her.   ? Patient is accompained by: Interpreter   ? Pertinent History Pt is a 61 year old female presenting s/p L shoulder arthroscopy supraspinatus repair and  subacromial decompression 08/22/21. Reports post surgical pain has been 9/10 since surgery. Is in a sling currently all hours of the day other than to shower. Has returned to surgeon 09/01/21 and got a good report. Has another appt scheduled for follow up in January. Reports incision has no drainage, but it is very senstivie to light touch. 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 In addition to full time work she reports walking 49mns 3-4x/week and she would like to return to this.  Pt reports she would like to return to her job after rehab but her boss has already told her she no longer has a job. Post surgical pain is as high as 9/10 with trunk rotation and trying to change clothes; and as low (and currently) 0/10. Pain is somewhat alleiviated by ice and pain medication. Patient lives with daughter who has been helping her though she works a lot (pt is alone often), has about 4 steps to enter 1 story home. Patient is currently not driving, daughter drives her to appts, her daughter makes meals and she is able to feed herself independently, is bathing modI ind standig in walkin shower soley with R hand, daughter fixing her hair for her, and dressing modI with no one else assisting her physically but she needs to prop her  arm with her other had and it is difficult. Sleeps in her bed on her back, with several pillows around her for comfort and to support her arm. Pt denies N/V, B&B changes, unexplained weight fluctuation, saddle paresthesia, fever, night sweats, or unrelenting night pain at this time.   ? Limitations Lifting;Reading;Writing;Walking;House hold activities   ? How long can you sit comfortably? unlimited   ? How long can you stand comfortably? unlimited   ? How long can you walk comfortably? unlimited   ? Diagnostic tests none post surgery   ? Patient Stated Goals restore motion/strength in shoulder in order to complete normal  household tasks   ? Currently in Pain? No/denies   ? ?  ?  ? ?  ? ? ?INTERVENTIONS ?  ?Therex ?Standing in front of mirror, LUE only: ?AROM shoulder abduction 2x8 with VC's to maintain in available range and pain free.  ?AROM shoulder flexion 1x10, 2x10 in scapular plane with reduced pain. ?Bicep curl BUE's 2#, 3x10 with min VC's for eccentric control ?For all exercises: Visual feedback using mirror paired with VC and TC to decrease UT activation and improve posture.  ?  ?Arm ergometer 4.5 min forward for gentle mobility and LUE endurance. ? ?Supine serratus punches L shoulder in 90 deg forward elevation. Great understanding of form/technique but painful with protraction despite attempts performing in reduced ranges. Discontinued after 8 reps.   ? ?Standing tricep extension with YTB: 3x10, with VC's for form/technique maintaining motion from elbow only. Mod TC's intermittently. Education provided on performance at home with scarf technique with YTB resting around posterior aspect of neck.  ? ?Pendulums multiple sets of 15-20 seconds performed throughout session for relaxation of shoulder;   ?  ?Pt resting 10 min in supine with ice pack donned on L shoulder after session for pain modulation. Not billed. Pt reporting improvement in pain after ice.  ? ? PT Education - 12/15/21 1346   ? ? Education Details form/technique with exercise   ? Person(s) Educated Patient   ? Methods Explanation;Demonstration;Tactile cues;Verbal cues   ? Comprehension Verbalized understanding;Returned demonstration   ? ?  ?  ? ?  ? ? ? PT Short Term Goals - 12/08/21 1551   ? ?  ? PT SHORT TERM GOAL #1  ? Title Pt will be independent with HEP in order to improve strength and decrease pain in order to improve pain-free function at home and work.   ? Baseline 09/08/21 HEP given   ? Time 4   ? Period Weeks   ? Status Achieved   ?  ? PT SHORT TERM GOAL #3  ? Title Pt will improve shoulder flexion and abduction AROM by 15 degrees each indicationg  progress with functional shoulder elevation   ? Baseline 80ABD, 105 Flexion at IE, 50 abd and 50 flex at PN 10/10. 11/03/21: flexion:135, abduction 105. 11/29/21: flexion 110, abduction, 85, ER 55. 12/08/21: F 110, abd 88   ? Time 6   ? Period Weeks   ? Status On-going   ? Target Date 01/10/22   ? ?  ?  ? ?  ? ? ? ? PT Long Term Goals - 12/08/21 1553   ? ?  ? PT LONG TERM GOAL #1  ? Title Patient will increase FOTO score to 44 to demonstrate predicted increase in functional mobility to complete ADLs   ? Baseline 09/08/21 4; 11/29/21: will assess next sesison; 12/08/21: 43   ? Time 6   ?  Period Weeks   ? Status On-going   ? Target Date 01/10/22   ?  ? PT LONG TERM GOAL #2  ? Title Pt will decrease worst pain as reported on NPRS by at least 3 points in order to demonstrate clinically significant reduction in pain.   ? Baseline 09/08/21 9/10. 11/03/21 5/10 NPS   ? Time 12   ? Period Weeks   ? Status Achieved   ? Target Date 09/05/21   ?  ? PT LONG TERM GOAL #3  ? Title Pt will demonstrate full active L shoulder motion in order to complete self care ADLS   ? Baseline 09/08/21 unable to attempt. 11/03/21 limited. 11/29/21: flexion 110, abduction 85, ER 55. 12/08/21: F 110, abd 88   ? Time 6   ? Period Weeks   ? Status On-going   ? Target Date 01/10/22   ?  ? PT LONG TERM GOAL #4  ? Title Patient will demonstrate gross L shoulder MMTs of 4+/5 in order to be able to complete heavy household tasks   ? Baseline flexion 3+/5, abduction 4/5 in 20 degrees of abduction painfree range, extension 5/5 in pain free rane, IR 4/5, ER 3/5. 11/29/21: 3 to 3+/5 flexion, abduction and ER (partial ROM); 12/08/21: 3/5 MMT through incomplete range, unable to apply pressure due to pain.   ? Time 6   ? Period Weeks   ? Status On-going   ? Target Date 01/10/22   ? ?  ?  ? ?  ? ? ? ? ? ? ? ? Plan - 12/15/21 1347   ? ? Clinical Impression Statement Pt continues to demonstrate excellent motivation during session. Continuing primary PT POC with focus on L shoulder  fflexion, scaption, abduction in standing to improve strength. Pt able to perform forward elevation in scapular plane with less pain versus full sagittal plane with use of mirror as visual feedback for neutral cervical posit

## 2021-12-20 ENCOUNTER — Ambulatory Visit: Payer: PRIVATE HEALTH INSURANCE

## 2021-12-20 ENCOUNTER — Other Ambulatory Visit: Payer: Self-pay

## 2021-12-20 DIAGNOSIS — M6281 Muscle weakness (generalized): Secondary | ICD-10-CM

## 2021-12-20 DIAGNOSIS — M25512 Pain in left shoulder: Secondary | ICD-10-CM | POA: Diagnosis not present

## 2021-12-20 DIAGNOSIS — M25612 Stiffness of left shoulder, not elsewhere classified: Secondary | ICD-10-CM

## 2021-12-20 NOTE — Therapy (Signed)
[facility] [address] [place], [address] Phone: [phone]   Fax:  [phone]  Physical Therapy Treatment  Patient Details  Name: Carolyn Sampson MRN: [id number] Date of Birth: Nov 04, 1960 Referring Provider (PT): Jones Broom MD   Encounter Date: 12/20/2021   PT End of Session - 12/20/21 1413     Visit Number 23    Number of Visits 29    Date for PT Re-Evaluation 01/10/22    Authorization Type WC    Authorization Time Period 09/08/21-11/29/21    PT Start Time 1345    PT Stop Time 1425    PT Time Calculation (min) 40 min    Activity Tolerance Patient tolerated treatment well    Behavior During Therapy [facility] for tasks assessed/performed             Past Medical History:  Diagnosis Date   Fatty liver    Pre-diabetes     Past Surgical History:  Procedure Laterality Date   ABDOMINAL HYSTERECTOMY     APPENDECTOMY     CESAREAN SECTION     CESAREAN SECTION     COLONOSCOPY WITH PROPOFOL N/A 06/02/2021   Procedure: COLONOSCOPY WITH PROPOFOL;  Surgeon: Toney Reil, MD;  Location: [facility] ENDOSCOPY;  Service: Gastroenterology;  Laterality: N/A;  SPANISH INTERPRETER   LAPAROSCOPIC HYSTERECTOMY     LAPAROSCOPIC OOPHERECTOMY Right    SHOULDER ARTHROSCOPY WITH ROTATOR CUFF REPAIR AND SUBACROMIAL DECOMPRESSION Left 08/22/2021   Procedure: SHOULDER ARTHROSCOPY WITH ROTATOR CUFF REPAIR AND SUBACROMIAL DECOMPRESSION;  Surgeon: Jones Broom, MD;  Location: [facility];  Service: Orthopedics;  Laterality: Left;    There were no vitals filed for this visit.   Subjective Assessment - 12/20/21 1403     Subjective Pt reports she's been doing well in general, having pain when doing house.    Pertinent History Pt is a 61 year old female presenting s/p L shoulder arthroscopy supraspinatus repair and subacromial decompression 08/22/21. Reports post surgical pain has been 9/10 since surgery.  Is in a sling currently all hours of the day other than to shower. Has returned to surgeon 09/01/21 and got a good report. Has another appt scheduled for follow up in January. Reports incision has no drainage, but it is very senstivie to light touch. 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 In addition to full time work she reports walking 3-4x/week and she would like to return to this.  Pt reports she would like to return to her job after rehab but her boss has already told her she no longer has a job. Post surgical pain is as high as 9/10 with trunk rotation and trying to change clothes; and as low (and currently) 0/10. Pain is somewhat alleiviated by ice and pain medication. Patient lives with daughter who has been helping her though she works a lot (pt is alone often), has about 4 steps to enter 1 story home. Patient is currently not driving, daughter drives her to appts, her daughter makes meals and she is able to feed herself independently, is bathing modI ind standig in walkin shower soley with R hand, daughter fixing her hair for her, and dressing modI with no one else assisting her physically but she needs to prop her arm with her other had and it is difficult. Sleeps in her bed on her back, with several pillows around her  for comfort and to support her arm. Pt denies N/V, B&B changes, unexplained weight fluctuation, saddle paresthesia, fever, night sweats, or unrelenting night pain at this time.    Currently in Pain? No/denies   5/10 with some exercises           Intervention: 12/20/21  -Sidelying Left shoulder ABDCT 3x15 @ 1lb (too weak and restricted to perform in standing) -Sidelying Left shoulder ER c 1lb weight 3x8 (fatigues quickly, profoundly weak)  -Supine Left UE long lever shoulder flexion 1x10 (pain and severe weakness from starting, but can control and perform from starting at 25 degrees (2 pillows)   -standing yellow TB row 1x15  -standing Left elbow flexion 1x15 @ 1lb  -standing red TB row 1x15 (fatigue at 12)  -standing Left elbow flexion 1x15 @ 1lb   *performing triceps extension at home with yellow TB  *still performing pulleys wand ER  *educated on need to update exercises moving forward to prepare for DC, emphasis on 3x/week strength building       PT Short Term Goals - 12/08/21 1551       PT SHORT TERM GOAL #1   Title Pt will be independent with HEP in order to improve strength and decrease pain in order to improve pain-free function at home and work.    Baseline 09/08/21 HEP given    Time 4    Period Weeks    Status Achieved      PT SHORT TERM GOAL #3   Title Pt will improve shoulder flexion and abduction AROM by 15 degrees each indicationg progress with functional shoulder elevation    Baseline 80ABD, 105 Flexion at IE, 50 abd and 50 flex at PN 10/10. 11/03/21: flexion:135, abduction 105. 11/29/21: flexion 110, abduction, 85, ER 55. 12/08/21: F 110, abd 88    Time 6    Period Weeks    Status On-going    Target Date 01/10/22               PT Long Term Goals - 12/08/21 1553       PT LONG TERM GOAL #1   Title Patient will increase FOTO score to 44 to demonstrate predicted increase in functional mobility to complete ADLs    Baseline 09/08/21 4; 11/29/21: will assess next sesison; 12/08/21: 43    Time 6    Period Weeks    Status On-going    Target Date 01/10/22      PT LONG TERM GOAL #2   Title Pt will decrease worst pain as reported on NPRS by at least 3 points in order to demonstrate clinically significant reduction in pain.    Baseline 09/08/21 9/10. 11/03/21 5/10 NPS    Time 12    Period Weeks    Status Achieved    Target Date 09/05/21      PT LONG TERM GOAL #3   Title Pt will demonstrate full active L shoulder motion in order to complete self care ADLS    Baseline 09/08/21 unable to attempt. 11/03/21 limited. 11/29/21: flexion 110, abduction 85, ER 55. 12/08/21:  F 110, abd 88    Time 6    Period Weeks    Status On-going    Target Date 01/10/22      PT LONG TERM GOAL #4   Title Patient will demonstrate gross L shoulder MMTs of 4+/5 in order to be able to complete heavy household tasks    Baseline flexion 3+/5, abduction 4/5 in 20 degrees of abduction  painfree range, extension 5/5 in pain free rane, IR 4/5, ER 3/5. 11/29/21: 3 to 3+/5 flexion, abduction and ER (partial ROM); 12/08/21: 3/5 MMT through incomplete range, unable to apply pressure due to pain.    Time 6    Period Weeks    Status On-going    Target Date 01/10/22                   Plan - 12/20/21 1416     Clinical Impression Statement Interpreter present for Spanish interpetation. Pt has just a couple weeks remaining prior to DC, ROM deficits have persisted (A/ROM more limited than P/ROM) and with significant end-range pain. More provocative to symptoms as well as limiting in function is the patients profound weakness in the shoulder, struggles to perform flexion initiation from supine and struggles with LUE ABDCT in standing. Modified strength program this date to more appropriately achieve targeted moderate loading. Pt given adequate recovery breaks between efforts. Will begin issuing these for home performance. Pt understands that many of these will not be appropriate for 7x/week performance, but she can continue to use pulleys for daily joint mobilization.    Personal Factors and Comorbidities Age;Comorbidity 1;Fitness;Past/Current Experience;Time since onset of injury/illness/exacerbation    Comorbidities pre-diabetes    Examination-Activity Limitations Carry;Lift;Reach Overhead;Sleep    Examination-Participation Restrictions Occupation;Driving;Community Activity;Cleaning;Laundry;Meal Prep    Stability/Clinical Decision Making Evolving/Moderate complexity    Clinical Decision Making Moderate    Rehab Potential Good    PT Frequency 2x / week    PT Duration 12 weeks    PT  Treatment/Interventions ADLs/Self Care Home Management;Aquatic Therapy;Electrical Stimulation;Moist Heat;Ultrasound;DME Instruction;Functional mobility training;Therapeutic activities;Therapeutic exercise;Neuromuscular re-education;Patient/family education;Manual techniques;Passive range of motion;Energy conservation;Taping;Vasopneumatic Device;Joint Manipulations;Cryotherapy;Traction;Dry needling    PT Next Visit Plan continued AROM progression; parascapular strengthening    PT Home Exercise Plan pulleys abd and flex, bilat ER RTB, AROM flex and abd, wall slides, tricep extension with YTB (latex free)             Patient will benefit from skilled therapeutic intervention in order to improve the following deficits and impairments:  Decreased range of motion, Improper body mechanics, Decreased activity tolerance, Decreased strength, Impaired UE functional use, Decreased mobility, Difficulty walking, Decreased endurance, Postural dysfunction, Increased muscle spasms, Impaired flexibility, Impaired tone, Hypomobility, Increased fascial restricitons, Pain  Visit Diagnosis: Acute pain of left shoulder  Muscle weakness (generalized)  Stiffness of left shoulder, not elsewhere classified     Problem List Patient Active Problem List   Diagnosis Date Noted   Screen for colon cancer    Muscle cramps 05/28/2018   Pre-diabetes 05/28/2018   Fatty liver 05/28/2018   Hair loss 05/28/2018   Sciatica 11/01/2015   S/P right oophorectomy 11/01/2015   S/P hysterectomy 11/01/2015  3:50 PM, 12/20/21 Rosamaria Lints, PT, DPT Physical Therapist - [place] [phone] (Office)    Niotaze C, PT 12/20/2021, 2:27 PM  [facility] [address] [place], [address] Phone: [phone]   Fax:  [phone]  Name: Isai Atanacio MRN: [id number] Date of Birth: Feb 22, 1961

## 2021-12-22 ENCOUNTER — Other Ambulatory Visit: Payer: Self-pay

## 2021-12-22 ENCOUNTER — Ambulatory Visit: Payer: PRIVATE HEALTH INSURANCE

## 2021-12-22 DIAGNOSIS — M25512 Pain in left shoulder: Secondary | ICD-10-CM | POA: Diagnosis not present

## 2021-12-22 DIAGNOSIS — M6281 Muscle weakness (generalized): Secondary | ICD-10-CM

## 2021-12-22 DIAGNOSIS — M25612 Stiffness of left shoulder, not elsewhere classified: Secondary | ICD-10-CM

## 2021-12-22 NOTE — Therapy (Signed)
[facility] [facility] ?[address] ?[place], [place], [address] ?Phone: [phone]   Fax:  [phone] ? ?Physical Therapy Treatment ? ?Patient Details  ?Name: Carolyn Sampson ?MRN: [id number] ?Date of Birth: 05/25/1961 ?Referring Provider (PT): Tania Ade MD ? ? ?Encounter Date: 12/22/2021 ? ? PT End of Session - 12/22/21 1505   ? ? Visit Number 24   ? Number of Visits 29   ? Date for PT Re-Evaluation 01/10/22   ? Authorization Type WC   ? Authorization Time Period 12/20/21-01/10/22   ? PT Start Time 1345   ? PT Stop Time 1430   ? PT Time Calculation (min) 45 min   ? Activity Tolerance Patient tolerated treatment well;No increased pain;Patient limited by pain   ? Behavior During Therapy [facility] for tasks assessed/performed   ? ?  ?  ? ?  ? ? ?Past Medical History:  ?Diagnosis Date  ? Fatty liver   ? Pre-diabetes   ? ? ?Past Surgical History:  ?Procedure Laterality Date  ? ABDOMINAL HYSTERECTOMY    ? APPENDECTOMY    ? CESAREAN SECTION    ? CESAREAN SECTION    ? COLONOSCOPY WITH PROPOFOL N/A 06/02/2021  ? Procedure: COLONOSCOPY WITH PROPOFOL;  Surgeon: Lin Landsman, MD;  Location: [facility];  Service: Gastroenterology;  Laterality: N/A;  [place] INTERPRETER  ? LAPAROSCOPIC HYSTERECTOMY    ? LAPAROSCOPIC OOPHERECTOMY Right   ? SHOULDER ARTHROSCOPY WITH ROTATOR CUFF REPAIR AND SUBACROMIAL DECOMPRESSION Left 08/22/2021  ? Procedure: SHOULDER ARTHROSCOPY WITH ROTATOR CUFF REPAIR AND SUBACROMIAL DECOMPRESSION;  Surgeon: Tania Ade, MD;  Location: [facility];  Service: Orthopedics;  Laterality: Left;  ? ? ?There were no vitals filed for this visit. ? ? Subjective Assessment - 12/22/21 1500   ? ? Subjective Pt reports feeling generally good today. She used her Left arm to open a car door and accidentally aggravated her pain. She reports shoulder felt fine after last PT session with new exercises. Pt asks again about obtaining a TENS unit for  home use at DC for conitnued pain control.   ? Patient is accompained by: Interpreter   ? Pertinent History Carolyn Sampson is a 27yoF who is referred to OPPT for rehabilitation of left shoulder s/p Left supraspinatus repair and subacromial decompression 08/22/21 with Dr. Tamera Punt.   ? Currently in Pain? Yes   ? Pain Score 1    ? Pain Location --   left shoulder, also tender adn allodynic to Left superior and posterior scapula.  ? ?  ?  ? ?  ? ?INTERVENTION: ?-Supine LUE chest press, unloaded, hand over shoulder 1x8, elbow on towel c minA ?*does not tolerate starting from table (~20 degrees extension)  ?-Supine LUE chest press, unloaded, hand over shoulder 1x8, elbow on yoga brick  ?-Left shoulder sidelying abduction 0-90 1x10  ?-Left shoulder ER 1x10 c axillary towel roll  ?-Left shoulder sidelying abduction 0-90 1x10  ?-Left shoulder ER 1x10 c axillary towel roll  ?-standing LUE biceps curl 3lb freeweight 1x15 ?-standing LUE biceps curl 3lb freeweight 1x15 ?*education on accessing HEP on app (pictures on paper too difficult to see)  ? ? PT Education - 12/22/21 1507   ? ? Education Details use of ice, TENS at DC to manage pain and improve motor activation.   ? Person(s) Educated Patient   ? Methods Explanation;Demonstration   ? Comprehension Verbalized understanding;Returned demonstration   ? ?  ?  ? ?  ? ? ?  PT Short Term Goals - 12/08/21 1551   ? ?  ? PT SHORT TERM GOAL #1  ? Title Pt will be independent with HEP in order to improve strength and decrease pain in order to improve pain-free function at home and work.   ? Baseline 09/08/21 HEP given   ? Time 4   ? Period Weeks   ? Status Achieved   ?  ? PT SHORT TERM GOAL #3  ? Title Pt will improve shoulder flexion and abduction AROM by 15 degrees each indicationg progress with functional shoulder elevation   ? Baseline 80ABD, 105 Flexion at IE, 50 abd and 50 flex at PN 10/10. 11/03/21: flexion:135, abduction 105. 11/29/21: flexion 110, abduction, 85, ER 55. 12/08/21: F  110, abd 88   ? Time 6   ? Period Weeks   ? Status On-going   ? Target Date 01/10/22   ? ?  ?  ? ?  ? ? ? ? PT Long Term Goals - 12/22/21 1516   ? ?  ? PT LONG TERM GOAL #1  ? Title Patient will increase FOTO score to 44 to demonstrate predicted increase in functional mobility to complete ADLs   ? Baseline 09/08/21 4; 11/29/21: will assess next sesison; 12/08/21: 43   ? Time 6   ? Period Weeks   ? Status On-going   ? Target Date 01/10/22   ?  ? PT LONG TERM GOAL #2  ? Title Pt will decrease worst pain as reported on NPRS by at least 3 points in order to demonstrate clinically significant reduction in pain.   ? Baseline 09/08/21 9/10. 11/03/21 5/10 NPS   ? Time 12   ? Period Weeks   ? Status Achieved   ? Target Date 09/05/21   ?  ? PT LONG TERM GOAL #3  ? Title Pt will demonstrate full active L shoulder motion in order to complete self care ADLS.   ? Baseline 09/08/21 unable to attempt. 11/03/21 limited. 11/29/21: flexion 110, abduction 85, ER 55. 12/08/21: F 110, abd 88   ? Time 6   ? Period Weeks   ? Status On-going   ? Target Date 01/10/22   ?  ? PT LONG TERM GOAL #4  ? Title Patient will demonstrate gross L shoulder MMTs of 4+/5 in order to be able to complete heavy household tasks   ? Baseline flexion 3+/5, abduction 4/5 in 20 degrees of abduction painfree range, extension 5/5 in pain free rane, IR 4/5, ER 3/5. 11/29/21: 3 to 3+/5 flexion, abduction and ER (partial ROM); 12/08/21: 3/5 MMT through incomplete range, unable to apply pressure due to pain.   ? Time 6   ? Period Weeks   ? Status On-going   ? Target Date 01/10/22   ? ?  ?  ? ?  ? ? ? ? ? ? ? ? Plan - 12/22/21 1507   ? ? Clinical Impression Statement Provided updated HEP for basic rotator cuff strengthening, mostly based on successes from prior session. Pt has profound weakness in shoulder and ease in fatiguability which warrants some optimation of range and position to perform A/ROM with good joint control. TENS used throughtout session. Pt agreeable to attempt  updated exercises 1x over the weekend. Author offered to send off for an order for TENS so she can obtain a unit for DC.Suprapsinatus exercises remain the most weak and fatiguable.   ? Personal Factors and Comorbidities Age;Comorbidity 1;Fitness;Past/Current Experience;Time since onset of injury/illness/exacerbation   ?  Comorbidities pre-diabetes   ? Examination-Activity Limitations Carry;Lift;Reach Overhead;Sleep   ? Examination-Participation Restrictions Occupation;Driving;Community Activity;Cleaning;Laundry;Meal Prep   ? Stability/Clinical Decision Making Evolving/Moderate complexity   ? Clinical Decision Making Moderate   ? Rehab Potential Good   ? PT Frequency 2x / week   ? PT Duration 12 weeks   ? PT Treatment/Interventions ADLs/Self Care Home Management;Aquatic Therapy;Electrical Stimulation;Moist Heat;Ultrasound;DME Instruction;Functional mobility training;Therapeutic activities;Therapeutic exercise;Neuromuscular re-education;Patient/family education;Manual techniques;Passive range of motion;Energy conservation;Taping;Vasopneumatic Device;Joint Manipulations;Cryotherapy;Traction;Dry needling   ? PT Next Visit Plan Follow up on HEP issued, conitnue with basic shoulder strengthening in tolerated ranges   ? PT Home Exercise Plan pulleys abd and flex, bilat ER RTB, AROM flex and abd, wall slides, tricep extension with YTB (latex free); [id number] 12/22/21   ? Consulted and Agree with Plan of Care Patient   ? ?  ?  ? ?  ? ? ?Patient will benefit from skilled therapeutic intervention in order to improve the following deficits and impairments:  Decreased range of motion, Improper body mechanics, Decreased activity tolerance, Decreased strength, Impaired UE functional use, Decreased mobility, Difficulty walking, Decreased endurance, Postural dysfunction, Increased muscle spasms, Impaired flexibility, Impaired tone, Hypomobility, Increased fascial restricitons, Pain ? ?Visit Diagnosis: ?Acute pain of left  shoulder ? ?Muscle weakness (generalized) ? ?Stiffness of left shoulder, not elsewhere classified ? ? ? ? ?Problem List ?Patient Active Problem List  ? Diagnosis Date Noted  ? Screen for colon cancer   ? Muscle cramps

## 2021-12-27 ENCOUNTER — Ambulatory Visit: Payer: PRIVATE HEALTH INSURANCE

## 2021-12-27 ENCOUNTER — Other Ambulatory Visit: Payer: Self-pay

## 2021-12-27 DIAGNOSIS — M25512 Pain in left shoulder: Secondary | ICD-10-CM

## 2021-12-27 DIAGNOSIS — M25612 Stiffness of left shoulder, not elsewhere classified: Secondary | ICD-10-CM

## 2021-12-27 DIAGNOSIS — M6281 Muscle weakness (generalized): Secondary | ICD-10-CM

## 2021-12-27 NOTE — Therapy (Signed)
[place] [facility] [facility] ?[address] ?[place], [place], [address] ?Phone: [phone]   Fax:  [phone] ? ?Physical Therapy Treatment ? ?Patient Details  ?Name: Carolyn Sampson ?MRN: [id number] ?Date of Birth: 06/28/1961 ?Referring Provider (PT): Tania Ade MD ? ? ?Encounter Date: 12/27/2021 ? ? PT End of Session - 12/27/21 1401   ? ? Visit Number 25   ? Number of Visits 29   ? Date for PT Re-Evaluation 01/10/22   ? Authorization Type WC   ? Authorization Time Period 12/20/21-01/10/22   ? Progress Note Due on Visit 20   ? PT Start Time 1346   ? PT Stop Time 1426   ? PT Time Calculation (min) 40 min   ? Activity Tolerance Patient tolerated treatment well;No increased pain;Patient limited by pain   ? Behavior During Therapy [facility] for tasks assessed/performed   ? ?  ?  ? ?  ? ? ?Past Medical History:  ?Diagnosis Date  ? Fatty liver   ? Pre-diabetes   ? ? ?Past Surgical History:  ?Procedure Laterality Date  ? ABDOMINAL HYSTERECTOMY    ? APPENDECTOMY    ? CESAREAN SECTION    ? CESAREAN SECTION    ? COLONOSCOPY WITH PROPOFOL N/A 06/02/2021  ? Procedure: COLONOSCOPY WITH PROPOFOL;  Surgeon: Lin Landsman, MD;  [facility]: [facility] ENDOSCOPY;  Service: Gastroenterology;  Laterality: N/A;  [place] INTERPRETER  ? LAPAROSCOPIC HYSTERECTOMY    ? LAPAROSCOPIC OOPHERECTOMY Right   ? SHOULDER ARTHROSCOPY WITH ROTATOR CUFF REPAIR AND SUBACROMIAL DECOMPRESSION Left 08/22/2021  ? Procedure: SHOULDER ARTHROSCOPY WITH ROTATOR CUFF REPAIR AND SUBACROMIAL DECOMPRESSION;  Surgeon: Tania Ade, MD;  Location: [place];  Service: Orthopedics;  Laterality: Left;  ? ? ?There were no vitals filed for this visit. ? ? Subjective Assessment - 12/27/21 1359   ? ? Subjective Pt reports she tried new exercises at home. She had pain increase afterwards to 5/10.   ? Pertinent History Theo Reither is a 74yoF who is referred to OPPT for rehabilitation of left shoulder s/p Left  supraspinatus repair and subacromial decompression 08/22/21 with Dr. Tamera Punt.   ? Limitations Lifting;Reading;Writing;Walking;House hold activities   ? Currently in Pain? Yes   ? Pain Score 5    ? ?  ?  ? ?  ? ? ?INTERVENTION THIS DATE:  ?-Heat and TENS application throughout session for pain control and improved tolerance to ROM and exercise  ? ?Supine exercises (arm supported on pillows to minimize extension  ?-elbow flexion/extension x20 ?-isometric extension in neutral 15x3secH  ?-chest press 1x5  ?-external rotation from belly to perpendicular 1x10 ? ?-elbow flexion/extension x20 ?-isometric extension in neutral 15x3secH  ?-chest press 1x5  ?-external rotation from belly to perpendicular 1x10, 1lb weight ? ?Short arc flexion LUE fingers to forehead 2x10 (very painful with intermittent sharp shooting pain, otherwise can be well tolerated)  ? ? ? ? PT Education - 12/27/21 1403   ? ? Education Details review of exercises for home   ? Person(s) Educated Patient   ? Methods Explanation;Demonstration   ? Comprehension Verbalized understanding;Returned demonstration   ? ?  ?  ? ?  ? ? ? PT Short Term Goals - 12/08/21 1551   ? ?  ? PT SHORT TERM GOAL #1  ? Title Pt will be independent with HEP in order to improve strength and decrease pain in order to improve pain-free function at home and work.   ? Baseline  09/08/21 HEP given   ? Time 4   ? Period Weeks   ? Status Achieved   ?  ? PT SHORT TERM GOAL #3  ? Title Pt will improve shoulder flexion and abduction AROM by 15 degrees each indicationg progress with functional shoulder elevation   ? Baseline 80ABD, 105 Flexion at IE, 50 abd and 50 flex at PN 10/10. 11/03/21: flexion:135, abduction 105. 11/29/21: flexion 110, abduction, 85, ER 55. 12/08/21: F 110, abd 88   ? Time 6   ? Period Weeks   ? Status On-going   ? Target Date 01/10/22   ? ?  ?  ? ?  ? ? ? ? PT Long Term Goals - 12/22/21 1516   ? ?  ? PT LONG TERM GOAL #1  ? Title Patient will increase FOTO score to 44 to  demonstrate predicted increase in functional mobility to complete ADLs   ? Baseline 09/08/21 4; 11/29/21: will assess next sesison; 12/08/21: 43   ? Time 6   ? Period Weeks   ? Status On-going   ? Target Date 01/10/22   ?  ? PT LONG TERM GOAL #2  ? Title Pt will decrease worst pain as reported on NPRS by at least 3 points in order to demonstrate clinically significant reduction in pain.   ? Baseline 09/08/21 9/10. 11/03/21 5/10 NPS   ? Time 12   ? Period Weeks   ? Status Achieved   ? Target Date 09/05/21   ?  ? PT LONG TERM GOAL #3  ? Title Pt will demonstrate full active L shoulder motion in order to complete self care ADLS.   ? Baseline 09/08/21 unable to attempt. 11/03/21 limited. 11/29/21: flexion 110, abduction 85, ER 55. 12/08/21: F 110, abd 88   ? Time 6   ? Period Weeks   ? Status On-going   ? Target Date 01/10/22   ?  ? PT LONG TERM GOAL #4  ? Title Patient will demonstrate gross L shoulder MMTs of 4+/5 in order to be able to complete heavy household tasks   ? Baseline flexion 3+/5, abduction 4/5 in 20 degrees of abduction painfree range, extension 5/5 in pain free rane, IR 4/5, ER 3/5. 11/29/21: 3 to 3+/5 flexion, abduction and ER (partial ROM); 12/08/21: 3/5 MMT through incomplete range, unable to apply pressure due to pain.   ? Time 6   ? Period Weeks   ? Status On-going   ? Target Date 01/10/22   ? ?  ?  ? ?  ? ? ? ? ? ? ? ? Plan - 12/27/21 1404   ? ? Clinical Impression Statement Continued to work toward optimizing pain control and basic strengthening. Pt continues to have significant exacerbation of pain with basic movements. Encouraged modifications of effort and position which about 50-75% but movements can be very painful and limiting at times.  ?  ? Personal Factors and Comorbidities Age;Comorbidity 1;Fitness;Past/Current Experience;Time since onset of injury/illness/exacerbation   ? Comorbidities pre-diabetes   ? Examination-Activity Limitations Carry;Lift;Reach Overhead;Sleep   ? Examination-Participation  Restrictions Occupation;Driving;Community Activity;Cleaning;Laundry;Meal Prep   ? Stability/Clinical Decision Making Evolving/Moderate complexity   ? Rehab Potential Good   ? PT Frequency 2x / week   ? PT Duration 12 weeks   ? PT Treatment/Interventions ADLs/Self Care Home Management;Aquatic Therapy;Electrical Stimulation;Moist Heat;Ultrasound;DME Instruction;Functional mobility training;Therapeutic activities;Therapeutic exercise;Neuromuscular re-education;Patient/family education;Manual techniques;Passive range of motion;Energy conservation;Taping;Vasopneumatic Device;Joint Manipulations;Cryotherapy;Traction;Dry needling   ? PT Next Visit Plan Follow up on HEP  issued, conitnue with basic shoulder strengthening in tolerated ranges   ? PT Home Exercise Plan pulleys abd and flex, bilat ER RTB, AROM flex and abd, wall slides, tricep extension with YTB (latex free); [id number] 12/22/21   ? Consulted and Agree with Plan of Care Patient   ? ?  ?  ? ?  ? ? ?Patient will benefit from skilled therapeutic intervention in order to improve the following deficits and impairments:  Decreased range of motion, Improper body mechanics, Decreased activity tolerance, Decreased strength, Impaired UE functional use, Decreased mobility, Difficulty walking, Decreased endurance, Postural dysfunction, Increased muscle spasms, Impaired flexibility, Impaired tone, Hypomobility, Increased fascial restricitons, Pain ? ?Visit Diagnosis: ?Acute pain of left shoulder ? ?Muscle weakness (generalized) ? ?Stiffness of left shoulder, not elsewhere classified ? ? ? ? ?Problem List ?Patient Active Problem List  ? Diagnosis Date Noted  ? Screen for colon cancer   ? Muscle cramps 05/28/2018  ? Pre-diabetes 05/28/2018  ? Fatty liver 05/28/2018  ? Hair loss 05/28/2018  ? Sciatica 11/01/2015  ? S/P right oophorectomy 11/01/2015  ? S/P hysterectomy 11/01/2015  ? ?2:31 PM, 12/27/21 ?Etta Grandchild, PT, DPT ?Physical Therapist - [place] ?[phone]  (Office) ? ? ?Carey Johndrow C, PT ?12/27/2021, 2:12 PM ? ?[place] ?[place] [facility] ?[address] ?[place], [place], [address] ?Phone: [phone]   Fax:  [phone]

## 2021-12-29 ENCOUNTER — Ambulatory Visit: Payer: PRIVATE HEALTH INSURANCE

## 2021-12-29 DIAGNOSIS — M25612 Stiffness of left shoulder, not elsewhere classified: Secondary | ICD-10-CM

## 2021-12-29 DIAGNOSIS — M6281 Muscle weakness (generalized): Secondary | ICD-10-CM

## 2021-12-29 DIAGNOSIS — M25512 Pain in left shoulder: Secondary | ICD-10-CM

## 2021-12-29 NOTE — Therapy (Signed)
[place] ?[place] [facility] ?[address] ?[place], [place], [address] ?Phone: [phone]   Fax:  [phone] ? ?Physical Therapy Treatment ? ?Patient Details  ?Name: Carolyn Sampson ?MRN: [id number] ?Date of Birth: [birth year] ?Referring Provider (PT): Tania Ade MD ? ? ?Encounter Date: 12/29/2021 ? ? PT End of Session - 12/29/21 1413   ? ? Visit Number 26   ? Number of Visits 29   ? Date for PT Re-Evaluation 01/10/22   ? Authorization Type WC   ? Authorization Time Period 12/20/21-01/10/22   ? Authorization - Visit Number 16   ? Authorization - Number of Visits 10   ? PT Start Time 1350   ? PT Stop Time 1430   ? PT Time Calculation (min) 40 min   ? Activity Tolerance Patient tolerated treatment well;No increased pain;Patient limited by pain   ? Behavior During Therapy [facility] for tasks assessed/performed   ? ?  ?  ? ?  ? ? ?Past Medical History:  ?Diagnosis Date  ? Fatty liver   ? Pre-diabetes   ? ? ?Past Surgical History:  ?Procedure Laterality Date  ? ABDOMINAL HYSTERECTOMY    ? APPENDECTOMY    ? CESAREAN SECTION    ? CESAREAN SECTION    ? COLONOSCOPY WITH PROPOFOL N/A 06/02/2021  ? Procedure: COLONOSCOPY WITH PROPOFOL;  Surgeon: Lin Landsman, MD;  Location: [facility] ENDOSCOPY;  Service: Gastroenterology;  Laterality: N/A;  [place] INTERPRETER  ? LAPAROSCOPIC HYSTERECTOMY    ? LAPAROSCOPIC OOPHERECTOMY Right   ? SHOULDER ARTHROSCOPY WITH ROTATOR CUFF REPAIR AND SUBACROMIAL DECOMPRESSION Left 08/22/2021  ? Procedure: SHOULDER ARTHROSCOPY WITH ROTATOR CUFF REPAIR AND SUBACROMIAL DECOMPRESSION;  Surgeon: Tania Ade, MD;  Location: [place];  Service: Orthopedics;  Laterality: Left;  ? ? ?There were no vitals filed for this visit. ? ? Subjective Assessment - 12/29/21 1405   ? ? Subjective Pt reports no updates since prior visit. She says her pain is easily aggravated but managed well with ice, heat, or tylenol.   ? Pertinent History Carolyn Sampson is a 49yoF who is referred to OPPT for rehabilitation of left shoulder s/p Left supraspinatus repair and subacromial decompression 08/22/21 with Dr. Tamera Punt.   ? Pain Score 5    5-5.5/10 shoulder pain  ? ?  ?  ? ?  ? ? ? ?INTERVENTION THIS DATE:  ?-silver ball flexion rollouts 1x20 ?-blue ball ABDCT left rollouts 1x20 ?-standing LUE flexion 1x10 ?-standing LUE ABDCT 1x10 (pain free range ?-standing LUE elbow flexoin 3lb (advanced) 1x6 ?-standing row redTB 1x20 (advanced from yellow, new band for home)  ?-butter ball BUE IR isometric squeeze 15x3secH  ?-red bilat ER 1x10x3 ? ?-silver ball flexion rollouts 1x20 ?-blue ball ABDCT left rollouts 1x20 ?-standing LUE flexion 1x10 ?-standing LUE ABDCT 1x10 (pain free range ?-standing LUE elbow flexoin 3lb (advanced) 1x6 ?-standing row redTB 1x20 (advanced from yellow, new band for home)  ?-butter ball BUE IR isometric squeeze 15x3secH  ? ?Rest breaks provided intermittently due to high fatigability of shoulder  ?Herb Grays interpreting ? ? ? ? ? ? ? PT Education - 12/29/21 1417   ? ? Education Details may need to update positions at home for   ? Person(s) Educated Patient   ? Methods Explanation;Demonstration   ? Comprehension Verbalized understanding   ? ?  ?  ? ?  ? ? ? PT Short Term Goals - 12/08/21 1551   ? ?  ? PT  SHORT TERM GOAL #1  ? Title Pt will be independent with HEP in order to improve strength and decrease pain in order to improve pain-free function at home and work.   ? Baseline 09/08/21 HEP given   ? Time 4   ? Period Weeks   ? Status Achieved   ?  ? PT SHORT TERM GOAL #3  ? Title Pt will improve shoulder flexion and abduction AROM by 15 degrees each indicationg progress with functional shoulder elevation   ? Baseline 80ABD, 105 Flexion at IE, 50 abd and 50 flex at PN 10/10. 11/03/21: flexion:135, abduction 105. 11/29/21: flexion 110, abduction, 85, ER 55. 12/08/21: F 110, abd 88   ? Time 6   ? Period Weeks   ? Status On-going   ? Target Date 01/10/22   ? ?  ?   ? ?  ? ? ? ? PT Long Term Goals - 12/22/21 1516   ? ?  ? PT LONG TERM GOAL #1  ? Title Patient will increase FOTO score to 44 to demonstrate predicted increase in functional mobility to complete ADLs   ? Baseline 09/08/21 4; 11/29/21: will assess next sesison; 12/08/21: 43   ? Time 6   ? Period Weeks   ? Status On-going   ? Target Date 01/10/22   ?  ? PT LONG TERM GOAL #2  ? Title Pt will decrease worst pain as reported on NPRS by at least 3 points in order to demonstrate clinically significant reduction in pain.   ? Baseline 09/08/21 9/10. 11/03/21 5/10 NPS   ? Time 12   ? Period Weeks   ? Status Achieved   ? Target Date 09/05/21   ?  ? PT LONG TERM GOAL #3  ? Title Pt will demonstrate full active L shoulder motion in order to complete self care ADLS.   ? Baseline 09/08/21 unable to attempt. 11/03/21 limited. 11/29/21: flexion 110, abduction 85, ER 55. 12/08/21: F 110, abd 88   ? Time 6   ? Period Weeks   ? Status On-going   ? Target Date 01/10/22   ?  ? PT LONG TERM GOAL #4  ? Title Patient will demonstrate gross L shoulder MMTs of 4+/5 in order to be able to complete heavy household tasks   ? Baseline flexion 3+/5, abduction 4/5 in 20 degrees of abduction painfree range, extension 5/5 in pain free rane, IR 4/5, ER 3/5. 11/29/21: 3 to 3+/5 flexion, abduction and ER (partial ROM); 12/08/21: 3/5 MMT through incomplete range, unable to apply pressure due to pain.   ? Time 6   ? Period Weeks   ? Status On-going   ? Target Date 01/10/22   ? ?  ?  ? ?  ? ? ? ? ? ? ? ? Plan - 12/29/21 1419   ? ? Clinical Impression Statement Worked mostly with basic single plane strength in optimal and pain free ranges. Unclear if pt is having a "good day" or if it is simply position, but tolerance for loading is much improved today in upright session, whereas much of 2 prior sessions were largely in supine to side lying. Pain remains easily provoked, but confidence and facility in exercises is starting to demonstrate progress overall.  ?  ? Personal  Factors and Comorbidities Age;Comorbidity 1;Fitness;Past/Current Experience;Time since onset of injury/illness/exacerbation   ? Comorbidities pre-diabetes   ? Examination-Activity Limitations Carry;Lift;Reach Overhead;Sleep   ? Examination-Participation Restrictions Occupation;Driving;Community Activity;Cleaning;Laundry;Meal Prep   ? Stability/Clinical Decision  Making Evolving/Moderate complexity   ? Clinical Decision Making Moderate   ? Rehab Potential Good   ? PT Frequency 2x / week   ? PT Duration 12 weeks   ? PT Treatment/Interventions ADLs/Self Care Home Management;Aquatic Therapy;Electrical Stimulation;Moist Heat;Ultrasound;DME Instruction;Functional mobility training;Therapeutic activities;Therapeutic exercise;Neuromuscular re-education;Patient/family education;Manual techniques;Passive range of motion;Energy conservation;Taping;Vasopneumatic Device;Joint Manipulations;Cryotherapy;Traction;Dry needling   ? PT Next Visit Plan Follow up on HEP issued, conitnue with basic shoulder strengthening in tolerated ranges   ? PT Home Exercise Plan pulleys abd and flex, bilat ER RTB, AROM flex and abd, wall slides, tricep extension with YTB (latex free); [id number] 12/22/21   ? Consulted and Agree with Plan of Care Patient   ? ?  ?  ? ?  ? ? ?Patient will benefit from skilled therapeutic intervention in order to improve the following deficits and impairments:  Decreased range of motion, Improper body mechanics, Decreased activity tolerance, Decreased strength, Impaired UE functional use, Decreased mobility, Difficulty walking, Decreased endurance, Postural dysfunction, Increased muscle spasms, Impaired flexibility, Impaired tone, Hypomobility, Increased fascial restricitons, Pain ? ?Visit Diagnosis: ?Acute pain of left shoulder ? ?Muscle weakness (generalized) ? ?Stiffness of left shoulder, not elsewhere classified ? ? ? ? ?Problem List ?Patient Active Problem List  ? Diagnosis Date Noted  ? Screen for colon cancer   ?  Muscle cramps 05/28/2018  ? Pre-diabetes 05/28/2018  ? Fatty liver 05/28/2018  ? Hair loss 05/28/2018  ? Sciatica 11/01/2015  ? S/P right oophorectomy 11/01/2015  ? S/P hysterectomy 11/01/2015  ? ?2:31 PM, 03

## 2022-01-02 ENCOUNTER — Ambulatory Visit: Payer: Worker's Compensation | Admitting: Physical Therapy

## 2022-01-03 ENCOUNTER — Ambulatory Visit: Payer: PRIVATE HEALTH INSURANCE | Attending: Orthopedic Surgery

## 2022-01-03 DIAGNOSIS — M6281 Muscle weakness (generalized): Secondary | ICD-10-CM | POA: Diagnosis present

## 2022-01-03 DIAGNOSIS — M25612 Stiffness of left shoulder, not elsewhere classified: Secondary | ICD-10-CM | POA: Insufficient documentation

## 2022-01-03 DIAGNOSIS — M25512 Pain in left shoulder: Secondary | ICD-10-CM | POA: Diagnosis present

## 2022-01-03 NOTE — Therapy (Signed)
[facility] ?[address] ?[place], [place], [address] ?Phone: [phone]   Fax:  [phone] ? ?Physical Therapy Treatment ? ?Patient Details  ?Name: Carolyn Sampson ?MRN: [id number] ?Date of Birth: 12/19/1960 ?Referring Provider (PT): Tania Ade MD ? ? ?Encounter Date: 01/03/2022 ? ? PT End of Session - 01/03/22 1429   ? ? Visit Number 27   ? Number of Visits 29   ? Date for PT Re-Evaluation 01/10/22   ? Authorization Type WC   ? Authorization Time Period 12/20/21-01/10/22   ? PT Start Time 1345   ? PT Stop Time 1425   ? PT Time Calculation (min) 40 min   ? Activity Tolerance Patient tolerated treatment well;No increased pain;Patient limited by fatigue   ? Behavior During Therapy [facility] for tasks assessed/performed   ? ?  ?  ? ?  ? ? ?Past Medical History:  ?Diagnosis Date  ? Fatty liver   ? Pre-diabetes   ? ? ?Past Surgical History:  ?Procedure Laterality Date  ? ABDOMINAL HYSTERECTOMY    ? APPENDECTOMY    ? CESAREAN SECTION    ? CESAREAN SECTION    ? COLONOSCOPY WITH PROPOFOL N/A 06/02/2021  ? Procedure: COLONOSCOPY WITH PROPOFOL;  Surgeon: Lin Landsman, MD;  Location: [facility] ENDOSCOPY;  Service: Gastroenterology;  Laterality: N/A;  [place] INTERPRETER  ? LAPAROSCOPIC HYSTERECTOMY    ? LAPAROSCOPIC OOPHERECTOMY Right   ? SHOULDER ARTHROSCOPY WITH ROTATOR CUFF REPAIR AND SUBACROMIAL DECOMPRESSION Left 08/22/2021  ? Procedure: SHOULDER ARTHROSCOPY WITH ROTATOR CUFF REPAIR AND SUBACROMIAL DECOMPRESSION;  Surgeon: Tania Ade, MD;  Location: [place];  Service: Orthopedics;  Laterality: Left;  ? ? ?There were no vitals filed for this visit. ? ? Subjective Assessment - 01/03/22 1428   ? ? Subjective Pt doing well, felt good afte rlast session, brief post exercise soreness, similar response after exercises over weekend. Pt asks to update her HEP to standing pe rprior conversation. Pt sees Dr. Tamera Punt next date for followup.   ?  Pertinent History [name] is a 38yoF who is referred to OPPT for rehabilitation of left shoulder s/p Left supraspinatus repair and subacromial decompression 08/22/21 with Dr. Tamera Punt.   ? Currently in Pain? No/denies   ? ?  ?  ? ?  ? ?Reassessment 3/9: ?FOTO - 43 ?MMT incomplete ROM, 3+/5 available range  ?A/ROM: - 110 degrees flexion, 88 degrees abduction ? ?INTERVENTION THIS DATE:  ?-blue ball flexion rollouts 1x15, 2x30sec ? ?-standing LUE shoulder flexion 1x10  ?-standing LUE elbow flexion 2lb 1*10 ?-standing LUE shoulder ABDCT 1x10 (pain free range, most completed to ~80 degrees)  ?-standing ER red TB 1x10, supinated ?-standing BUE cable row 1x10 @ 10lb  ? ?*recovery interval  ? ?-standing LUE shoulder flexion 1x10  ?-standing LUE elbow flexion 2lb 1*10 ?-standing LUE shoulder ABDCT 1x10 (pain free range, most completed to ~80 degrees)  ?-standing ER red TB 1x10, supinated ?-standing BUE cable row 1x10 @ 10lb  ? ?*reviewed HEP print out and updates  ? ?-wall pushup 1x15 (self-selected distance from wall for resistance adjustment)  ?-quadruped UE weight shifting 4x each UE, limited by weakness/pain ?-quadruped sagittal rocking weight shift x10 ? ?-------------------------------- ?INTERVENTION 12/29/21: ?  ?-silver ball flexion rollouts 1x20 ?-blue ball ABDCT left rollouts 1x20 ?-standing LUE flexion 1x10 ?-standing LUE ABDCT 1x10 (pain free range ?-standing LUE elbow flexoin 3lb (advanced) 1x6 ?-standing row redTB 1x20 (advanced from yellow, new band for home)  ?-  butter ball BUE IR isometric squeeze 15x3secH  ?-red bilat ER 1x10x3 ?  ?-silver ball flexion rollouts 1x20 ?-blue ball ABDCT left rollouts 1x20 ?-standing LUE flexion 1x10 ?-standing LUE ABDCT 1x10 (pain free range ?-standing LUE elbow flexoin 3lb (advanced) 1x6 ?-standing row redTB 1x20 (advanced from yellow, new band for home)  ?-butter ball BUE IR isometric squeeze 15x3secH  ?  ?Rest breaks provided intermittently due to high fatigability  of shoulder  ?Herb Grays interpreting ? ?--------------------------------- ?INTERVENTION 12/27/21 ?-Heat and TENS application throughout session for pain control and improved tolerance to ROM and exercise  ?  ?Supine exercises (arm supported on pillows to minimize extension  ?-elbow flexion/extension x20 ?-isometric extension in neutral 15x3secH  ?-chest press 1x5  ?-external rotation from belly to perpendicular 1x10 ?  ?-elbow flexion/extension x20 ?-isometric extension in neutral 15x3secH  ?-chest press 1x5  ?-external rotation from belly to perpendicular 1x10, 1lb weight ?  ?Short arc flexion LUE fingers to forehead 2x10 (very painful with intermittent sharp shooting pain, otherwise can be well tolerated)  ? ? ? PT Education - 01/03/22 1430   ? ? Education Details limiting exercises based on fatigue/pain   ? Person(s) Educated Patient   ? Methods Explanation   ? Comprehension Verbalized understanding;Returned demonstration   ? ?  ?  ? ?  ? ? ? PT Short Term Goals - 12/08/21 1551   ? ?  ? PT SHORT TERM GOAL #1  ? Title Pt will be independent with HEP in order to improve strength and decrease pain in order to improve pain-free function at home and work.   ? Baseline 09/08/21 HEP given   ? Time 4   ? Period Weeks   ? Status Achieved   ?  ? PT SHORT TERM GOAL #3  ? Title Pt will improve shoulder flexion and abduction AROM by 15 degrees each indicationg progress with functional shoulder elevation   ? Baseline 80ABD, 105 Flexion at IE, 50 abd and 50 flex at PN 10/10. 11/03/21: flexion:135, abduction 105. 11/29/21: flexion 110, abduction, 85, ER 55. 12/08/21: F 110, abd 88   ? Time 6   ? Period Weeks   ? Status On-going   ? Target Date 01/10/22   ? ?  ?  ? ?  ? ? ? ? PT Long Term Goals - 01/03/22 1541   ? ?  ? PT LONG TERM GOAL #1  ? Title Patient will increase FOTO score to 44 to demonstrate predicted increase in functional mobility to complete ADLs   ? Baseline 09/08/21 4;  12/08/21: 43   ? Time 6   ? Period Weeks   ? Status  On-going   ? Target Date 01/10/22   ?  ? PT LONG TERM GOAL #2  ? Title Pt will decrease worst pain as reported on NPRS by at least 3 points in order to demonstrate clinically significant reduction in pain.   ? Baseline 09/08/21 9/10. 11/03/21 5/10 NPS; 01/03/22: 5/10   ? Time 12   ? Period Weeks   ? Status Achieved   ? Target Date 09/05/21   ?  ? PT LONG TERM GOAL #3  ? Title Pt will demonstrate full active L shoulder motion in order to complete self care ADLS.   ? Baseline 09/08/21 unable to attempt. 11/03/21 limited. 11/29/21: flexion 110, abduction 85, ER 55. 12/08/21: F 110, abd 88   ? Time 6   ? Period Weeks   ? Status On-going   ?  Target Date 01/10/22   ?  ? PT LONG TERM GOAL #4  ? Title Patient will demonstrate gross L shoulder MMTs of 4+/5 in order to be able to complete heavy household tasks   ? Baseline flexion 3+/5, abduction 4/5 in 20 degrees of abduction painfree range, extension 5/5 in pain free rane, IR 4/5, ER 3/5. 11/29/21: 3 to 3+/5 flexion, abduction and ER (partial ROM); 12/08/21: 3/5 MMT through incomplete range, unable to apply pressure due to pain; 01/03/22: grossly 3+/5 in Left shoulder   ? Time 6   ? Period Weeks   ? Status On-going   ? Target Date 01/10/22   ? ?  ?  ? ?  ? ? ? ? ? ? ? ? Plan - 01/03/22 1431   ? ? Clinical Impression Statement Continued with exercise set from last session as it was notably better in tolerance compared to prior set in supine/side lying. Pt continued to demonstrate remarkable fatiguability and weakness in left shoulder, however heavy focus on strrength interventions last 3-4 sessions is starting to reveal benefits. Pt able to advance resistance in certain exercises. Pt is improving in her ability to know when to take a rest break as motor fatigue is often associated with a loss of kinematic coupling of segments and rapid progression pain. Pt is given HEP handout update at end of session to reflect current best tolerated and targeted strengthening activity for home, but Pryor Curia  explains that she still have severla weeks to months remaining for full resolution of arm function and strength. Ended with attempted quaduped closed-chain loading, however pt is not quite strong enough to t

## 2022-01-04 ENCOUNTER — Ambulatory Visit: Payer: PRIVATE HEALTH INSURANCE | Admitting: Physical Therapy

## 2022-01-05 ENCOUNTER — Ambulatory Visit: Payer: PRIVATE HEALTH INSURANCE

## 2022-01-05 DIAGNOSIS — M25612 Stiffness of left shoulder, not elsewhere classified: Secondary | ICD-10-CM | POA: Diagnosis not present

## 2022-01-05 DIAGNOSIS — M25512 Pain in left shoulder: Secondary | ICD-10-CM

## 2022-01-05 DIAGNOSIS — M6281 Muscle weakness (generalized): Secondary | ICD-10-CM

## 2022-01-05 NOTE — Therapy (Signed)
?[facility] ?[address] ?[place], [place], [address] ?Phone: [phone]   Fax:  [phone] ? ?Physical Therapy Treatment/Reassessment  ?Recertification  ? ?Patient Details  ?Name: Carolyn Sampson ?MRN: [id number] ?Date of Birth: 1961/05/06 ?Referring Provider (PT): Tania Ade MD ? ? ?Encounter Date: 01/05/2022 ? ? PT End of Session - 01/05/22 0841   ? ? Visit Number 28   ? Number of Visits 41   ? Date for PT Re-Evaluation 02/16/22   ? Authorization Type WC   ? Authorization Time Period 12/20/21-01/10/22; 01/05/22-02/16/22   ? Progress Note Due on Visit 30   ? PT Start Time 0831   ? PT Stop Time 0911   ? PT Time Calculation (min) 40 min   ? Activity Tolerance Patient tolerated treatment well;No increased pain;Patient limited by fatigue   ? Behavior During Therapy [facility] for tasks assessed/performed   ? ?  ?  ? ?  ? ? ?Past Medical History:  ?Diagnosis Date  ? Fatty liver   ? Pre-diabetes   ? ? ?Past Surgical History:  ?Procedure Laterality Date  ? ABDOMINAL HYSTERECTOMY    ? APPENDECTOMY    ? CESAREAN SECTION    ? CESAREAN SECTION    ? COLONOSCOPY WITH PROPOFOL N/A 06/02/2021  ? Procedure: COLONOSCOPY WITH PROPOFOL;  Surgeon: Lin Landsman, MD;  Location[facility]COPY;  Service: Gastroenterology;  Laterality: N/A;  [place] INTERPRETER  ? LAPAROSCOPIC HYSTERECTOMY    ? LAPAROSCOPIC OOPHERECTOMY Right   ? SHOULDER ARTHROSCOPY WITH ROTATOR CUFF REPAIR AND SUBACROMIAL DECOMPRESSION Left 08/22/2021  ? Procedure: SHOULDER ARTHROSCOPY WITH ROTATOR CUFF REPAIR AND SUBACROMIAL DECOMPRESSION;  Surgeon: Tania Ade, MD;  Location: [place];  Service: Orthopedics;  Laterality: Left;  ? ? ?There were no vitals filed for this visit. ? ? Subjective Assessment - 01/05/22 0832   ? ? Subjective Pt doing well today, shoulder feels good as it is early in the day. She saw ortho who was pleased with progress, wnts pt to conitnue with 6 more weeks of  PT. HEP still going well.   ? Pertinent History Carolyn Sampson is a 79yoF who is referred to OPPT for rehabilitation of left shoulder s/p Left supraspinatus repair and subacromial decompression 08/22/21 with Dr. Tamera Punt.   ? Currently in Pain? No/denies   ? Pain Score 0-No pain   ? ?  ?  ? ?  ? ?Reassessment 01/05/22: ?FOTO - 46 (01/05/22); 43 (12/08/21) ? ?Shoulder ROM Assessment 01/05/22     ? Right  Left (operative) Left (12/08/21) Left (12/29)  ?Flexion A/ROM 154 106 110   ?ABDCT A/ROM  155 96 88   ?External rotation  C6    ?Internal Rotation   L4    ?   Left 2/28   ?Flexion P/ROM  128 110 ~90  ?ABDCT P/ROM  112 85 ~90  ?ER P/ROM  65 55 ~45  ?IR P/ROM  64  ~45  ?  Left (01/05/22)    ?MMT shoulder Flexion  4/5    ?MMT shoulder ABDCT  4/5    ? ?INTERVENTION THIS DATE:  ?-Supine isometric stabilization left shoulder flexion at 90 degrees 10x15sec H  ? ? ?INTERVENTION:  ?-blue ball flexion rollouts 1x15, 2x30sec ?  ?-standing LUE shoulder flexion 1x10  ?-standing LUE elbow flexion 2lb 1*10 ?-standing LUE shoulder ABDCT 1x10 (pain free range, most completed to ~80 degrees)  ?-standing ER red TB 1x10, supinated ?-standing BUE cable row  1x10 @ 10lb  ?  ?*recovery interval  ?  ?-standing LUE shoulder flexion 1x10  ?-standing LUE elbow flexion 2lb 1*10 ?-standing LUE shoulder ABDCT 1x10 (pain free range, most completed to ~80 degrees)  ?-standing ER red TB 1x10, supinated ?-standing BUE cable row 1x10 @ 10lb  ?  ?*reviewed HEP print out and updates  ?  ?-wall pushup 1x15 (self-selected distance from wall for resistance adjustment)  ?-quadruped UE weight shifting 4x each UE, limited by weakness/pain ?-quadruped sagittal rocking weight shift x10 ?  ?-------------------------------- ?INTERVENTION 12/29/21: ?  ?-silver ball flexion rollouts 1x20 ?-blue ball ABDCT left rollouts 1x20 ?-standing LUE flexion 1x10 ?-standing LUE ABDCT 1x10 (pain free range ?-standing LUE elbow flexoin 3lb (advanced) 1x6 ?-standing row redTB 1x20 (advanced  from yellow, new band for home)  ?-butter ball BUE IR isometric squeeze 15x3secH  ?-red bilat ER 1x10x3 ?  ?-silver ball flexion rollouts 1x20 ?-blue ball ABDCT left rollouts 1x20 ?-standing LUE flexion 1x10 ?-standing LUE ABDCT 1x10 (pain free range ?-standing LUE elbow flexoin 3lb (advanced) 1x6 ?-standing row redTB 1x20 (advanced from yellow, new band for home)  ?-butter ball BUE IR isometric squeeze 15x3secH  ?  ?Rest breaks provided intermittently due to high fatigability of shoulder  ?Herb Grays interpreting ? ? ? ? PT Education - 01/05/22 0842   ? ? Education Details how to use TENS script   ? Methods Explanation   ? Comprehension Verbalized understanding   ? ?  ?  ? ?  ? ? ? PT Short Term Goals - 12/08/21 1551   ? ?  ? PT SHORT TERM GOAL #1  ? Title Pt will be independent with HEP in order to improve strength and decrease pain in order to improve pain-free function at home and work.   ? Baseline 09/08/21 HEP given   ? Time 4   ? Period Weeks   ? Status Achieved   ?  ? PT SHORT TERM GOAL #3  ? Title Pt will improve shoulder flexion and abduction AROM by 15 degrees each indicationg progress with functional shoulder elevation   ? Baseline 80ABD, 105 Flexion at IE, 50 abd and 50 flex at PN 10/10. 11/03/21: flexion:135, abduction 105. 11/29/21: flexion 110, abduction, 85, ER 55. 12/08/21: F 110, abd 88   ? Time 6   ? Period Weeks   ? Status On-going   ? Target Date 01/10/22   ? ?  ?  ? ?  ? ? ? ? PT Long Term Goals - 01/03/22 1541   ? ?  ? PT LONG TERM GOAL #1  ? Title Patient will increase FOTO score to 44 to demonstrate predicted increase in functional mobility to complete ADLs   ? Baseline 09/08/21 4;  12/08/21: 43   ? Time 6   ? Period Weeks   ? Status On-going   ? Target Date 01/10/22   ?  ? PT LONG TERM GOAL #2  ? Title Pt will decrease worst pain as reported on NPRS by at least 3 points in order to demonstrate clinically significant reduction in pain.   ? Baseline 09/08/21 9/10. 11/03/21 5/10 NPS; 01/03/22: 5/10   ? Time  12   ? Period Weeks   ? Status Achieved   ? Target Date 09/05/21   ?  ? PT LONG TERM GOAL #3  ? Title Pt will demonstrate full active L shoulder motion in order to complete self care ADLS.   ? Baseline 09/08/21 unable to attempt. 11/03/21  limited. 11/29/21: flexion 110, abduction 85, ER 55. 12/08/21: F 110, abd 88   ? Time 6   ? Period Weeks   ? Status On-going   ? Target Date 01/10/22   ?  ? PT LONG TERM GOAL #4  ? Title Patient will demonstrate gross L shoulder MMTs of 4+/5 in order to be able to complete heavy household tasks   ? Baseline flexion 3+/5, abduction 4/5 in 20 degrees of abduction painfree range, extension 5/5 in pain free rane, IR 4/5, ER 3/5. 11/29/21: 3 to 3+/5 flexion, abduction and ER (partial ROM); 12/08/21: 3/5 MMT through incomplete range, unable to apply pressure due to pain; 01/03/22: grossly 3+/5 in Left shoulder   ? Time 6   ? Period Weeks   ? Status On-going   ? Target Date 01/10/22   ? ?  ?  ? ?  ? ? ? ? ? ? ? ? Plan - 01/05/22 0901   ? ? Clinical Impression Statement Reassessment of ROM this date, noted improved ROM in general with a wide gap between active and passive due to pain. MMT improving as well. Pt will be recerted today based on updated orthopedics recommendations. Pt is improving but still largely limited in tolerated ROM, true ROM, and strength in shoulder- pt remains far from baseline. Pt issued a script from her MD for TENS unit per her request. Pt very pleased that she will be able to continue with therapy.   ? Personal Factors and Comorbidities Age;Comorbidity 1;Fitness;Past/Current Experience;Time since onset of injury/illness/exacerbation   ? Comorbidities pre-diabetes   ? Examination-Activity Limitations Carry;Lift;Reach Overhead;Sleep   ? Examination-Participation Restrictions Occupation;Driving;Community Activity;Cleaning;Laundry;Meal Prep   ? Stability/Clinical Decision Making Evolving/Moderate complexity   ? Clinical Decision Making Moderate   ? Rehab Potential Good   ? PT  Frequency 2x / week   ? PT Duration 12 weeks   ? PT Treatment/Interventions ADLs/Self Care Home Management;Aquatic Therapy;Electrical Stimulation;Moist Heat;Ultrasound;DME Instruction;Functional mobili

## 2022-01-09 ENCOUNTER — Ambulatory Visit: Payer: PRIVATE HEALTH INSURANCE

## 2022-01-09 DIAGNOSIS — M25612 Stiffness of left shoulder, not elsewhere classified: Secondary | ICD-10-CM | POA: Diagnosis not present

## 2022-01-09 DIAGNOSIS — M6281 Muscle weakness (generalized): Secondary | ICD-10-CM

## 2022-01-09 DIAGNOSIS — M25512 Pain in left shoulder: Secondary | ICD-10-CM

## 2022-01-09 NOTE — Therapy (Signed)
[facility] ?[address] ?[place], [place], [address] ?Phone: [phone]   Fax:  [phone] ? ?Physical Therapy Treatment ? ?Patient Details  ?Name: Carolyn Sampson ?MRN: [id number] ?Date of Birth: [birth year] ?Referring Provider (PT): Tania Ade MD ? ? ?Encounter Date: 01/09/2022 ? ? PT End of Session - 01/09/22 1136   ? ? Visit Number 29   ? Number of Visits 41   ? Date for PT Re-Evaluation 02/16/22   ? Authorization Type WC   ? Authorization Time Period 12/20/21-01/10/22; 01/05/22-02/16/22   ? Progress Note Due on Visit 30   ? PT Start Time 1130   ? PT Stop Time 1210   ? PT Time Calculation (min) 40 min   ? Activity Tolerance Patient tolerated treatment well;No increased pain;Patient limited by fatigue   ? Behavior During Therapy [facility] for tasks assessed/performed   ? ?  ?  ? ?  ? ? ?Past Medical History:  ?Diagnosis Date  ? Fatty liver   ? Pre-diabetes   ? ? ?Past Surgical History:  ?Procedure Laterality Date  ? ABDOMINAL HYSTERECTOMY    ? APPENDECTOMY    ? CESAREAN SECTION    ? CESAREAN SECTION    ? COLONOSCOPY WITH PROPOFOL N/A 06/02/2021  ? Procedure: COLONOSCOPY WITH PROPOFOL;  Surgeon: Lin Landsman, MD;  Location: [facility] ENDOSCOPY;  Service: Gastroenterology;  Laterality: N/A;  [place] INTERPRETER  ? LAPAROSCOPIC HYSTERECTOMY    ? LAPAROSCOPIC OOPHERECTOMY Right   ? SHOULDER ARTHROSCOPY WITH ROTATOR CUFF REPAIR AND SUBACROMIAL DECOMPRESSION Left 08/22/2021  ? Procedure: SHOULDER ARTHROSCOPY WITH ROTATOR CUFF REPAIR AND SUBACROMIAL DECOMPRESSION;  Surgeon: Tania Ade, MD;  Location: [facility];  Service: Orthopedics;  Laterality: Left;  ? ? ?There were no vitals filed for this visit. ? ? Subjective Assessment - 01/09/22 1133   ? ? Subjective Pt doing well today. Worked on her HEP twice since last visit, some pain with rows and retraction.   ? Pertinent History Carolyn Sampson is a 8yoF who is referred to OPPT for  rehabilitation of left shoulder s/p Left supraspinatus repair and subacromial decompression 08/22/21 with Dr. Tamera Punt.   ? Currently in Pain? Yes   ? Pain Score 1    ? Pain Location --   left  ? ?  ?  ? ?  ? ? ? ? ? ? ?Reassessment 01/05/22: ?FOTO - 46 (01/05/22); 43 (12/08/21) ?  ?      ?Shoulder ROM Assessment 01/05/22        ?  Right  Left (operative) Left (12/08/21) Left (12/29)  ?Flexion A/ROM 154 106 110    ?ABDCT A/ROM  155 96 88    ?External rotation   C6      ?Internal Rotation    L4      ?      Left 2/28    ?Flexion P/ROM   128 110 ~90  ?ABDCT P/ROM   112 85 ~90  ?ER P/ROM   65 55 ~45  ?IR P/ROM   64   ~45  ?    Left (01/05/22)      ?MMT shoulder Flexion   4/5      ?MMT shoulder ABDCT   4/5      ?  ? ?INTERVENTION THIS DATE:  ?Blue ball rollouts 1x15x3secH, 3x30sec  ? ?*TENS application throughout session ? ?-LUE shoulder ABDCT to 90 1x10  ?-LUE shoulder flexion to 90 1x12 ?-standing row 1x10 @  12lb (increased today)  ?-Left elbow flexion 1x12 @ 2.5lb  ?-Left GTB elbow triceps extension 1x15 ? ?-LUE shoulder ABDCT to 90 1x10  ?-LUE shoulder flexion to 90 1x12 ?-standing row 1x10 @ 12lb (increased today)  ?-Left elbow flexion 1x12 @ 2.5lb  ?-Left GTB elbow triceps extension 1x15 ? ?-BUE ER red TB 1x15  ?-wall pushups 1x15 ?-BUE ER red TB 1x15  ?-wall pushups 1x15 ? ?--------------------------------- ? ?INTERVENTION 4/6:  ?-Supine isometric stabilization left shoulder flexion at 90 degrees 10x15sec H  ?  ? INTERVENTION 4/4: ?-blue ball flexion rollouts 1x15, 2x30sec ?  ?-standing LUE shoulder flexion 1x10  ?-standing LUE elbow flexion 2lb 1*10 ?-standing LUE shoulder ABDCT 1x10 (pain free range, most completed to ~80 degrees)  ?-standing ER red TB 1x10, supinated ?-standing BUE cable row 1x10 @ 10lb  ?  ?*recovery interval  ?  ?-standing LUE shoulder flexion 1x10  ?-standing LUE elbow flexion 2lb 1*10 ?-standing LUE shoulder ABDCT 1x10 (pain free range, most completed to ~80 degrees)  ?-standing ER red TB 1x10,  supinated ?-standing BUE cable row 1x10 @ 10lb  ?  ?*reviewed HEP print out and updates  ?  ?-wall pushup 1x15 (self-selected distance from wall for resistance adjustment)  ?-quadruped UE weight shifting 4x each UE, limited by weakness/pain ?-quadruped sagittal rocking weight shift x10 ?  ?-------------------------------- ?INTERVENTION 12/29/21: ?  ?-silver ball flexion rollouts 1x20 ?-blue ball ABDCT left rollouts 1x20 ?-standing LUE flexion 1x10 ?-standing LUE ABDCT 1x10 (pain free range ?-standing LUE elbow flexoin 3lb (advanced) 1x6 ?-standing row redTB 1x20 (advanced from yellow, new band for home)  ?-butter ball BUE IR isometric squeeze 15x3secH  ?-red bilat ER 1x10x3 ?  ?-silver ball flexion rollouts 1x20 ?-blue ball ABDCT left rollouts 1x20 ?-standing LUE flexion 1x10 ?-standing LUE ABDCT 1x10 (pain free range ?-standing LUE elbow flexoin 3lb (advanced) 1x6 ?-standing row redTB 1x20 (advanced from yellow, new band for home)  ?-butter ball BUE IR isometric squeeze 15x3secH  ? ? ? ? PT Education - 01/09/22 1143   ? ? Education Details When to take rest breaks   ? Person(s) Educated Patient   ? Methods Explanation   ? Comprehension Verbalized understanding   ? ?  ?  ? ?  ? ? ? PT Short Term Goals - 12/08/21 1551   ? ?  ? PT SHORT TERM GOAL #1  ? Title Pt will be independent with HEP in order to improve strength and decrease pain in order to improve pain-free function at home and work.   ? Baseline 09/08/21 HEP given   ? Time 4   ? Period Weeks   ? Status Achieved   ?  ? PT SHORT TERM GOAL #3  ? Title Pt will improve shoulder flexion and abduction AROM by 15 degrees each indicationg progress with functional shoulder elevation   ? Baseline 80ABD, 105 Flexion at IE, 50 abd and 50 flex at PN 10/10. 11/03/21: flexion:135, abduction 105. 11/29/21: flexion 110, abduction, 85, ER 55. 12/08/21: F 110, abd 88   ? Time 6   ? Period Weeks   ? Status On-going   ? Target Date 01/10/22   ? ?  ?  ? ?  ? ? ? ? PT Long Term Goals -  01/03/22 1541   ? ?  ? PT LONG TERM GOAL #1  ? Title Patient will increase FOTO score to 44 to demonstrate predicted increase in functional mobility to complete ADLs   ? Baseline 09/08/21 4;  12/08/21: 43   ? Time 6   ? Period Weeks   ? Status On-going   ? Target Date 01/10/22   ?  ? PT LONG TERM GOAL #2  ? Title Pt will decrease worst pain as reported on NPRS by at least 3 points in order to demonstrate clinically significant reduction in pain.   ? Baseline 09/08/21 9/10. 11/03/21 5/10 NPS; 01/03/22: 5/10   ? Time 12   ? Period Weeks   ? Status Achieved   ? Target Date 09/05/21   ?  ? PT LONG TERM GOAL #3  ? Title Pt will demonstrate full active L shoulder motion in order to complete self care ADLS.   ? Baseline 09/08/21 unable to attempt. 11/03/21 limited. 11/29/21: flexion 110, abduction 85, ER 55. 12/08/21: F 110, abd 88   ? Time 6   ? Period Weeks   ? Status On-going   ? Target Date 01/10/22   ?  ? PT LONG TERM GOAL #4  ? Title Patient will demonstrate gross L shoulder MMTs of 4+/5 in order to be able to complete heavy household tasks   ? Baseline flexion 3+/5, abduction 4/5 in 20 degrees of abduction painfree range, extension 5/5 in pain free rane, IR 4/5, ER 3/5. 11/29/21: 3 to 3+/5 flexion, abduction and ER (partial ROM); 12/08/21: 3/5 MMT through incomplete range, unable to apply pressure due to pain; 01/03/22: grossly 3+/5 in Left shoulder   ? Time 6   ? Period Weeks   ? Status On-going   ? Target Date 01/10/22   ? ?  ?  ? ?  ? ? ? ? ? ? ? ? Plan - 01/09/22 1143   ? ? Clinical Impression Statement Continued with current plan of care as laid out in evaluation and recent prior sessions. All interventions and precautions maintained as indicated by postoperative protocol per referring provider and/or subsequent communication with said provider. Author continues to advance interventions in volume or intensity when appropriate in order to maximize future carryover to daily functional tasks. Surgical incision remains unremarkable  upon visual inspection. Pt educated on best technique for each intervention- author uses verbal, visual, tactile cues to optimize learning. Author takes steps to maximize patient independence when appropriate. Pt remain

## 2022-01-12 ENCOUNTER — Telehealth: Payer: Self-pay

## 2022-01-12 ENCOUNTER — Ambulatory Visit: Payer: PRIVATE HEALTH INSURANCE | Admitting: Physical Therapy

## 2022-01-12 NOTE — Telephone Encounter (Signed)
Pt did not show for appointment. Author asked Spanish language interpreter Leola Brazil to call patient, call completed. Pt reports she forgot about appointment time, as most of her appointments are later in day. Pt plans to make next scheduled appointment.  ? ?8:58 AM, 01/12/22 ?Etta Grandchild, PT, DPT ?Physical Therapist - [place] ?[phone] (Office) ? ?

## 2022-01-20 ENCOUNTER — Encounter: Payer: Self-pay | Admitting: Physical Therapy

## 2022-01-20 ENCOUNTER — Ambulatory Visit: Payer: PRIVATE HEALTH INSURANCE | Admitting: Physical Therapy

## 2022-01-20 DIAGNOSIS — M6281 Muscle weakness (generalized): Secondary | ICD-10-CM

## 2022-01-20 DIAGNOSIS — M25512 Pain in left shoulder: Secondary | ICD-10-CM

## 2022-01-20 DIAGNOSIS — M25612 Stiffness of left shoulder, not elsewhere classified: Secondary | ICD-10-CM | POA: Diagnosis not present

## 2022-01-20 NOTE — Therapy (Signed)
[facility] ?[address] ?[place], [place], [address] ?Phone: [phone]   Fax:  [phone] ? ?Physical Therapy Treatment/Physical Therapy Progress Note ? ? ?Dates of reporting period  12/08/21   to   01/20/22 ? ? ?Patient Details  ?Name: Carolyn Sampson ?MRN: [id number] ?Date of Birth: 1961/04/13 ?Referring Provider (PT): Tania Ade MD ? ? ?Encounter Date: 01/20/2022 ? ? PT End of Session - 01/20/22 1140   ? ? Visit Number 30   ? Number of Visits 41   ? Date for PT Re-Evaluation 02/16/22   ? Authorization Type WC   ? Authorization Time Period 12/20/21-01/10/22; 01/05/22-02/16/22   ? Progress Note Due on Visit 30   ? PT Start Time 1134   ? PT Stop Time 6789   ? PT Time Calculation (min) 41 min   ? Activity Tolerance Patient tolerated treatment well;No increased pain;Patient limited by fatigue   ? Behavior During Therapy [facility] for tasks assessed/performed   ? ?  ?  ? ?  ? ? ?Past Medical History:  ?Diagnosis Date  ? Fatty liver   ? Pre-diabetes   ? ? ?Past Surgical History:  ?Procedure Laterality Date  ? ABDOMINAL HYSTERECTOMY    ? APPENDECTOMY    ? CESAREAN SECTION    ? CESAREAN SECTION    ? COLONOSCOPY WITH PROPOFOL N/A 06/02/2021  ? Procedure: COLONOSCOPY WITH PROPOFOL;  Surgeon: Lin Landsman, MD;  Location: [facility];  Service: Gastroenterology;  Laterality: N/A;  [place] INTERPRETER  ? LAPAROSCOPIC HYSTERECTOMY    ? LAPAROSCOPIC OOPHERECTOMY Right   ? SHOULDER ARTHROSCOPY WITH ROTATOR CUFF REPAIR AND SUBACROMIAL DECOMPRESSION Left 08/22/2021  ? Procedure: SHOULDER ARTHROSCOPY WITH ROTATOR CUFF REPAIR AND SUBACROMIAL DECOMPRESSION;  Surgeon: Tania Ade, MD;  Location: [place];  Service: Orthopedics;  Laterality: Left;  ? ? ?There were no vitals filed for this visit. ? ? Subjective Assessment - 01/20/22 1139   ? ? Subjective Pt states she is doing well today. Has been compliant with HEP. Denies pain upon arrival.   ?  Pertinent History Carolyn Sampson is a 87yoF who is referred to OPPT for rehabilitation of left shoulder s/p Left supraspinatus repair and subacromial decompression 08/22/21 with Dr. Tamera Punt.   ? Currently in Pain? No/denies   ? ?  ?  ? ?  ? ? ? ? ?*PROGRESS NOTE* ? ?       ?Shoulder ROM Assessment 01/20/22    ?  Right  Left (operative)  ?Flexion A/ROM 155 112  ?ABDCT A/ROM  155 92  ?External rotation   C6  ?Internal Rotation    L4  ?       ?Flexion P/ROM   125  ?ABDCT P/ROM   110  ?ER P/ROM   65  ?IR P/ROM   64  ?    Left (01/20/22)  ?MMT shoulder Flexion  4/5  ?MMT shoulder ABDCT   4-/5  ?MMT shoulder ER  4-/5  ?MMT shoulder IR  4+/5  ? ? ?INTERVENTION:  ?Blue ball rollouts with 3 second hold, x15 followed by 3x30sec holds  ?  ?*TENS application L shoulder throughout session ?  ?-Omega standing cable single arm row 5#, 3x10   ? ?-LUE shoulder ABD to 90 2x10  ?-LUE shoulder flexion to 90 2x10 ?-LUE elbow flexion 3#, 2x10 ?-LUE triceps extension 2# DB 2x10 ?  ?-LUE ER RedTB 2x10 ?-LUE IR RedTB 2x10 ? ?Assessment of ROM and strength (  see table above). ? ? ? ?Clinical Impression: Pt is pleasant and motivated throughout session. Limited by pain however improved since last seen by this author. TENS unit applied throughout session for pain modulation. POC was continued for gentle strengthening. Pt remains limited in both active and passive ROM in left shoulder. Strength is progressing nicely however left shoulder remains significantly weaker than right. PT continues to encourage HEP for optimal recovery. Limited body awareness with tactile cueing for improved mechanics with many exercises. Pt will continue to benefit from skilled PT to address limitations in strength, ROM and functional use for improved completion of ADLs, work tasks and quality of life. ? ? ?Patient's condition has the potential to improve in response to therapy. Maximum improvement is yet to be obtained. The anticipated improvement is attainable and  reasonable in a generally predictable time.   ? ? ? ? ? ? ? ? ? ? PT Short Term Goals - 01/20/22 1236   ? ?  ? PT SHORT TERM GOAL #1  ? Title Pt will be independent with HEP in order to improve strength and decrease pain in order to improve pain-free function at home and work.   ? Baseline 09/08/21 HEP given   ? Time 4   ? Period Weeks   ? Status Achieved   ?  ? PT SHORT TERM GOAL #3  ? Title Pt will improve shoulder flexion and abduction AROM by 15 degrees each indicationg progress with functional shoulder elevation   ? Baseline 80ABD, 105 Flexion at IE, 50 abd and 50 flex at PN 10/10. 11/03/21: flexion:135, abduction 105. 11/29/21: flexion 110, abduction, 85, ER 55. 12/08/21: F 110, abd 88   ? Time 6   ? Period Weeks   ? Status Achieved   ? Target Date 01/10/22   ? ?  ?  ? ?  ? ? ? ? PT Long Term Goals - 01/20/22 1236   ? ?  ? PT LONG TERM GOAL #1  ? Title Patient will increase FOTO score to 44 to demonstrate predicted increase in functional mobility to complete ADLs   ? Baseline 09/08/21 4;  12/08/21: 43   ? Time 6   ? Period Weeks   ? Status On-going   ? Target Date 01/10/22   ?  ? PT LONG TERM GOAL #2  ? Title Pt will decrease worst pain as reported on NPRS by at least 3 points in order to demonstrate clinically significant reduction in pain.   ? Baseline 09/08/21 9/10. 11/03/21 5/10 NPS; 01/03/22: 5/10   ? Time 12   ? Period Weeks   ? Status Achieved   ? Target Date 09/05/21   ?  ? PT LONG TERM GOAL #3  ? Title Pt will demonstrate full active L shoulder motion in order to complete self care ADLS.   ? Baseline 09/08/21 unable to attempt. 11/03/21 limited. 11/29/21: flexion 110, abduction 85, ER 55. 12/08/21: F 110, abd 88; 01/20/22: flexion 112, abd 92   ? Time 6   ? Period Weeks   ? Status On-going   ? Target Date 03/03/22   ?  ? PT LONG TERM GOAL #4  ? Title Patient will demonstrate gross L shoulder MMTs of 4+/5 in order to be able to complete heavy household tasks   ? Baseline flexion 3+/5, abduction 4/5 in 20 degrees of  abduction painfree range, extension 5/5 in pain free rane, IR 4/5, ER 3/5. 11/29/21: 3 to 3+/5 flexion, abduction and  ER (partial ROM); 12/08/21: 3/5 MMT through incomplete range, unable to apply pressure due to pain; 01/03/22: grossly 3+/5 in Left shoulder; 01/20/22: grossly 4-/5 in left shoulder   ? Time 6   ? Period Weeks   ? Status On-going   ? Target Date 03/03/22   ? ?  ?  ? ?  ? ? ? ? ? ? ? ? Plan - 01/20/22 1234   ? ? Clinical Impression Statement Pt is pleasant and motivated throughout session. Limited by pain however improved since last seen by this author. TENS unit applied throughout session for pain modulation. POC was continued for gentle strengthening. Pt remains limited in both active and passive ROM in left shoulder. Strength is progressing nicely however left shoulder remains significantly weaker than right. PT continues to encourage HEP for optimal recovery. Limited body awareness with tactile cueing for improved mechanics with many exercises. Pt will continue to benefit from skilled PT to address limitations in strength, ROM and functional use for improved completion of ADLs, work tasks and quality of life.   ? Personal Factors and Comorbidities Age;Comorbidity 1;Fitness;Past/Current Experience;Time since onset of injury/illness/exacerbation   ? Comorbidities pre-diabetes   ? Examination-Activity Limitations Carry;Lift;Reach Overhead;Sleep   ? Examination-Participation Restrictions Occupation;Driving;Community Activity;Cleaning;Laundry;Meal Prep   ? Stability/Clinical Decision Making Evolving/Moderate complexity   ? Rehab Potential Good   ? PT Frequency 2x / week   ? PT Duration 12 weeks   ? PT Treatment/Interventions ADLs/Self Care Home Management;Aquatic Therapy;Electrical Stimulation;Moist Heat;Ultrasound;DME Instruction;Functional mobility training;Therapeutic activities;Therapeutic exercise;Neuromuscular re-education;Patient/family education;Manual techniques;Passive range of motion;Energy  conservation;Taping;Vasopneumatic Device;Joint Manipulations;Cryotherapy;Traction;Dry needling   ? PT Next Visit Plan Follow up on HEP issued, conitnue with basic shoulder strengthening in tolerated ranges   ? PT Home Ex

## 2022-01-25 ENCOUNTER — Encounter: Payer: Self-pay | Admitting: Physical Therapy

## 2022-01-25 ENCOUNTER — Ambulatory Visit: Payer: Worker's Compensation | Attending: Orthopedic Surgery | Admitting: Physical Therapy

## 2022-01-25 DIAGNOSIS — M25612 Stiffness of left shoulder, not elsewhere classified: Secondary | ICD-10-CM | POA: Diagnosis not present

## 2022-01-25 DIAGNOSIS — M25512 Pain in left shoulder: Secondary | ICD-10-CM | POA: Insufficient documentation

## 2022-01-25 DIAGNOSIS — M6281 Muscle weakness (generalized): Secondary | ICD-10-CM | POA: Insufficient documentation

## 2022-01-25 NOTE — Therapy (Signed)
[place] [facility] ?[address] ?[place], [place], [address] ?Phone: [phone]   Fax:  [phone] ? ?Physical Therapy Treatment ? ?Patient Details  ?Name: Carolyn Sampson ?MRN: [id number] ?Date of Birth: 1960-12-30 ?Referring Provider (PT): Tania Ade MD ? ? ?Encounter Date: 01/25/2022 ? ? PT End of Session - 01/25/22 1259   ? ? Visit Number 31   ? Number of Visits 41   ? Date for PT Re-Evaluation 02/16/22   ? Authorization Type WC   ? Authorization Time Period 12/20/21-01/10/22; 01/05/22-02/16/22   ? Progress Note Due on Visit 30   ? PT Start Time 5027   ? PT Stop Time 0959   ? PT Time Calculation (min) 39 min   ? Activity Tolerance Patient tolerated treatment well;No increased pain;Patient limited by fatigue   ? Behavior During Therapy [facility] for tasks assessed/performed   ? ?  ?  ? ?  ? ? ?Past Medical History:  ?Diagnosis Date  ? Fatty liver   ? Pre-diabetes   ? ? ?Past Surgical History:  ?Procedure Laterality Date  ? ABDOMINAL HYSTERECTOMY    ? APPENDECTOMY    ? CESAREAN SECTION    ? CESAREAN SECTION    ? COLONOSCOPY WITH PROPOFOL N/A 06/02/2021  ? Procedure: COLONOSCOPY WITH PROPOFOL;  Surgeon: Lin Landsman, MD;  Location: [facility] ENDOSCOPY;  Service: Gastroenterology;  Laterality: N/A;  [place] INTERPRETER  ? LAPAROSCOPIC HYSTERECTOMY    ? LAPAROSCOPIC OOPHERECTOMY Right   ? SHOULDER ARTHROSCOPY WITH ROTATOR CUFF REPAIR AND SUBACROMIAL DECOMPRESSION Left 08/22/2021  ? Procedure: SHOULDER ARTHROSCOPY WITH ROTATOR CUFF REPAIR AND SUBACROMIAL DECOMPRESSION;  Surgeon: Tania Ade, MD;  Location: [place];  Service: Orthopedics;  Laterality: Left;  ? ? ?There were no vitals filed for this visit. ? ? Subjective Assessment - 01/25/22 0927   ? ? Subjective Pt states she remained sore following last session. She believes it was due to PROM measurements taken at end of session. Lowest pain since last session is 4/10. Current pain is 6/10.  Has been icing  frequently.   ? Pertinent History Carolyn Sampson is a 17yoF who is referred to OPPT for rehabilitation of left shoulder s/p Left supraspinatus repair and subacromial decompression 08/22/21 with Dr. Tamera Punt.   ? Currently in Pain? Yes   ? Pain Score 6    ? ?  ?  ? ?  ? ? ? ? ? ?INTERVENTION:  ?Blue ball rollouts with 3 second hold, x15 followed  ?  ?*TENS application L shoulder throughout session ?  ?-Omega standing cable single arm row 5#, 2x10   ?  ?-LUE shoulder ABD to 90 (improved ROM during final sets) 2x10  ?-LUE shoulder flexion to 90 2x10 ?-LUE elbow flexion 3# progressed to 4# DM, 2x10 ?-LUE triceps extension 2# progressed to 3# DB 2x10 ?  ?- STM utilizing effleurage, p?trissage and trigger point release to lateral deltoid, brachialis and triceps x12 minutes. ?  ?  ?  ?Clinical Impression: Pt is pleasant and motivated throughout session. PT was cautious to not aggravate further pain this session and finished session with pain modulation via STM techniques. Pt reported feeling better by end of session. PT encouraged pt to complete IR and ER at home due to lack of time. She has difficulty abducting to 90d actively however improves with repetition. Pt will continue to benefit from skilled PT to address limitations in strength, ROM and functional use for improved completion of  ADLs, work tasks and quality of life. ? ? ? ? ? ? ? ? PT Short Term Goals - 01/20/22 1236   ? ?  ? PT SHORT TERM GOAL #1  ? Title Pt will be independent with HEP in order to improve strength and decrease pain in order to improve pain-free function at home and work.   ? Baseline 09/08/21 HEP given   ? Time 4   ? Period Weeks   ? Status Achieved   ?  ? PT SHORT TERM GOAL #3  ? Title Pt will improve shoulder flexion and abduction AROM by 15 degrees each indicationg progress with functional shoulder elevation   ? Baseline 80ABD, 105 Flexion at IE, 50 abd and 50 flex at PN 10/10. 11/03/21: flexion:135, abduction 105. 11/29/21:  flexion 110, abduction, 85, ER 55. 12/08/21: F 110, abd 88   ? Time 6   ? Period Weeks   ? Status Achieved   ? Target Date 01/10/22   ? ?  ?  ? ?  ? ? ? ? PT Long Term Goals - 01/20/22 1236   ? ?  ? PT LONG TERM GOAL #1  ? Title Patient will increase FOTO score to 44 to demonstrate predicted increase in functional mobility to complete ADLs   ? Baseline 09/08/21 4;  12/08/21: 43   ? Time 6   ? Period Weeks   ? Status On-going   ? Target Date 01/10/22   ?  ? PT LONG TERM GOAL #2  ? Title Pt will decrease worst pain as reported on NPRS by at least 3 points in order to demonstrate clinically significant reduction in pain.   ? Baseline 09/08/21 9/10. 11/03/21 5/10 NPS; 01/03/22: 5/10   ? Time 12   ? Period Weeks   ? Status Achieved   ? Target Date 09/05/21   ?  ? PT LONG TERM GOAL #3  ? Title Pt will demonstrate full active L shoulder motion in order to complete self care ADLS.   ? Baseline 09/08/21 unable to attempt. 11/03/21 limited. 11/29/21: flexion 110, abduction 85, ER 55. 12/08/21: F 110, abd 88; 01/20/22: flexion 112, abd 92   ? Time 6   ? Period Weeks   ? Status On-going   ? Target Date 03/03/22   ?  ? PT LONG TERM GOAL #4  ? Title Patient will demonstrate gross L shoulder MMTs of 4+/5 in order to be able to complete heavy household tasks   ? Baseline flexion 3+/5, abduction 4/5 in 20 degrees of abduction painfree range, extension 5/5 in pain free rane, IR 4/5, ER 3/5. 11/29/21: 3 to 3+/5 flexion, abduction and ER (partial ROM); 12/08/21: 3/5 MMT through incomplete range, unable to apply pressure due to pain; 01/03/22: grossly 3+/5 in Left shoulder; 01/20/22: grossly 4-/5 in left shoulder   ? Time 6   ? Period Weeks   ? Status On-going   ? Target Date 03/03/22   ? ?  ?  ? ?  ? ? ? ? ? ? ? ? Plan - 01/25/22 1300   ? ? Clinical Impression Statement Pt is pleasant and motivated throughout session. PT was cautious to not aggravate further pain this session and finished session with pain modulation via STM techniques. Pt reported feeling  better by end of session. PT encouraged pt to complete IR and ER at home due to lack of time. She has difficulty abducting to 90d actively however improves with repetition. Pt will continue  to benefit from skilled PT to address limitations in strength, ROM and functional use for improved completion of ADLs, work tasks and quality of life.   ? Personal Factors and Comorbidities Age;Comorbidity 1;Fitness;Past/Current Experience;Time since onset of injury/illness/exacerbation   ? Comorbidities pre-diabetes   ? Examination-Activity Limitations Carry;Lift;Reach Overhead;Sleep   ? Examination-Participation Restrictions Occupation;Driving;Community Activity;Cleaning;Laundry;Meal Prep   ? Stability/Clinical Decision Making Evolving/Moderate complexity   ? Rehab Potential Good   ? PT Frequency 2x / week   ? PT Duration 12 weeks   ? PT Treatment/Interventions ADLs/Self Care Home Management;Aquatic Therapy;Electrical Stimulation;Moist Heat;Ultrasound;DME Instruction;Functional mobility training;Therapeutic activities;Therapeutic exercise;Neuromuscular re-education;Patient/family education;Manual techniques;Passive range of motion;Energy conservation;Taping;Vasopneumatic Device;Joint Manipulations;Cryotherapy;Traction;Dry needling   ? PT Next Visit Plan Follow up on HEP issued, conitnue with basic shoulder strengthening in tolerated ranges   ? PT Home Exercise Plan 01/02/22: can conitnue with table slides daily for gentle mobility;  [id number]   ? Consulted and Agree with Plan of Care Patient   ? ?  ?  ? ?  ? ? ?Patient will benefit from skilled therapeutic intervention in order to improve the following deficits and impairments:  Decreased range of motion, Improper body mechanics, Decreased activity tolerance, Decreased strength, Impaired UE functional use, Decreased mobility, Difficulty walking, Decreased endurance, Postural dysfunction, Increased muscle spasms, Impaired flexibility, Impaired tone, Hypomobility, Increased fascial  restricitons, Pain ? ?Visit Diagnosis: ?Stiffness of left shoulder, not elsewhere classified ? ?Muscle weakness (generalized) ? ?Acute pain of left shoulder ? ? ? ? ?Problem List ?Patient Active Problem L

## 2022-01-31 ENCOUNTER — Encounter: Payer: Self-pay | Admitting: Physical Therapy

## 2022-01-31 ENCOUNTER — Ambulatory Visit: Payer: PRIVATE HEALTH INSURANCE | Attending: Orthopedic Surgery | Admitting: Physical Therapy

## 2022-01-31 DIAGNOSIS — M25512 Pain in left shoulder: Secondary | ICD-10-CM | POA: Insufficient documentation

## 2022-01-31 DIAGNOSIS — M6281 Muscle weakness (generalized): Secondary | ICD-10-CM | POA: Diagnosis present

## 2022-01-31 DIAGNOSIS — M25612 Stiffness of left shoulder, not elsewhere classified: Secondary | ICD-10-CM | POA: Insufficient documentation

## 2022-01-31 NOTE — Therapy (Signed)
[place] [facility] ?[address] ?[place], [place], [address] ?Phone: [phone]   Fax:  [phone] ? ?Physical Therapy Treatment ? ?Patient Details  ?Name: Carolyn Sampson ?MRN: [id number] ?Date of Birth: 28-Jul-1961 ?Referring Provider (PT): Tania Ade MD ? ? ?Encounter Date: 01/31/2022 ? ? PT End of Session - 01/31/22 1535   ? ? Visit Number 32   ? Number of Visits 41   ? Date for PT Re-Evaluation 02/16/22   ? Authorization Type WC   ? Authorization Time Period 12/20/21-01/10/22; 01/05/22-02/16/22   ? Progress Note Due on Visit 30   ? PT Start Time 1432   ? PT Stop Time 7517   ? PT Time Calculation (min) 46 min   ? Activity Tolerance Patient tolerated treatment well;No increased pain;Patient limited by fatigue   ? Behavior During Therapy [facility] for tasks assessed/performed   ? ?  ?  ? ?  ? ? ?Past Medical History:  ?Diagnosis Date  ? Fatty liver   ? Pre-diabetes   ? ? ?Past Surgical History:  ?Procedure Laterality Date  ? ABDOMINAL HYSTERECTOMY    ? APPENDECTOMY    ? CESAREAN SECTION    ? CESAREAN SECTION    ? COLONOSCOPY WITH PROPOFOL N/A 06/02/2021  ? Procedure: COLONOSCOPY WITH PROPOFOL;  Surgeon: Lin Landsman, MD;  Location: [facility] ENDOSCOPY;  Service: Gastroenterology;  Laterality: N/A;  [place] INTERPRETER  ? LAPAROSCOPIC HYSTERECTOMY    ? LAPAROSCOPIC OOPHERECTOMY Right   ? SHOULDER ARTHROSCOPY WITH ROTATOR CUFF REPAIR AND SUBACROMIAL DECOMPRESSION Left 08/22/2021  ? Procedure: SHOULDER ARTHROSCOPY WITH ROTATOR CUFF REPAIR AND SUBACROMIAL DECOMPRESSION;  Surgeon: Tania Ade, MD;  Location: [place];  Service: Orthopedics;  Laterality: Left;  ? ? ?There were no vitals filed for this visit. ? ? Subjective Assessment - 01/31/22 1434   ? ? Subjective Pt states she is doing better today. Reports 2/10 today. Is compliant with HEP. Reports pain in left upper trap.   ? Pertinent History Carolyn Sampson is a 65yoF who is referred to OPPT  for rehabilitation of left shoulder s/p Left supraspinatus repair and subacromial decompression 08/22/21 with Dr. Tamera Punt.   ? ?  ?  ? ?  ? ? ? ? ? ?INTERVENTION:  ?Arm ergometer, level 1 for 5 minutes; change in direction midway. ?  ?*TENS application L shoulder throughout session ?  ?-Omega standing cable single arm row 5#, 3x10   ?-Omega chest press 5#, 3x10 ? -used both arms with PT providing mild assist  ?  ?-LUE shoulder ABD to 90 2x10  ?-LUE shoulder flexion to 90 2x10 ?-LUE elbow flexion 4# DB, 2x10 ?-LUE triceps extension 3# DB 2x10 ?-GH ER RedTB, 2x10 ?  ?- STM utilizing effleurage, p?trissage and trigger point release to upper trap; suboccipital release x10 minutes. ?  ?  ?  ?Clinical Impression: Pt is pleasant and motivated throughout session. Interventions were progressed with resistance for elbow strengthening and added chest press. She reported pain in left upper trap upon arrival. Trigger point was identified; she had an excellent response to [facility] and demonstrated improved and pain-free cervical ROM. Pt will continue to benefit from skilled PT to address limitations in strength, ROM and functional use for improved completion of ADLs, work tasks and quality of life. ? ? ? ? ? ? ? ? PT Short Term Goals - 01/20/22 1236   ? ?  ? PT SHORT TERM GOAL #1  ? Title Pt  will be independent with HEP in order to improve strength and decrease pain in order to improve pain-free function at home and work.   ? Baseline 09/08/21 HEP given   ? Time 4   ? Period Weeks   ? Status Achieved   ?  ? PT SHORT TERM GOAL #3  ? Title Pt will improve shoulder flexion and abduction AROM by 15 degrees each indicationg progress with functional shoulder elevation   ? Baseline 80ABD, 105 Flexion at IE, 50 abd and 50 flex at PN 10/10. 11/03/21: flexion:135, abduction 105. 11/29/21: flexion 110, abduction, 85, ER 55. 12/08/21: F 110, abd 88   ? Time 6   ? Period Weeks   ? Status Achieved   ? Target Date 01/10/22   ? ?  ?  ? ?  ? ? ? ? PT Long  Term Goals - 01/20/22 1236   ? ?  ? PT LONG TERM GOAL #1  ? Title Patient will increase FOTO score to 44 to demonstrate predicted increase in functional mobility to complete ADLs   ? Baseline 09/08/21 4;  12/08/21: 43   ? Time 6   ? Period Weeks   ? Status On-going   ? Target Date 01/10/22   ?  ? PT LONG TERM GOAL #2  ? Title Pt will decrease worst pain as reported on NPRS by at least 3 points in order to demonstrate clinically significant reduction in pain.   ? Baseline 09/08/21 9/10. 11/03/21 5/10 NPS; 01/03/22: 5/10   ? Time 12   ? Period Weeks   ? Status Achieved   ? Target Date 09/05/21   ?  ? PT LONG TERM GOAL #3  ? Title Pt will demonstrate full active L shoulder motion in order to complete self care ADLS.   ? Baseline 09/08/21 unable to attempt. 11/03/21 limited. 11/29/21: flexion 110, abduction 85, ER 55. 12/08/21: F 110, abd 88; 01/20/22: flexion 112, abd 92   ? Time 6   ? Period Weeks   ? Status On-going   ? Target Date 03/03/22   ?  ? PT LONG TERM GOAL #4  ? Title Patient will demonstrate gross L shoulder MMTs of 4+/5 in order to be able to complete heavy household tasks   ? Baseline flexion 3+/5, abduction 4/5 in 20 degrees of abduction painfree range, extension 5/5 in pain free rane, IR 4/5, ER 3/5. 11/29/21: 3 to 3+/5 flexion, abduction and ER (partial ROM); 12/08/21: 3/5 MMT through incomplete range, unable to apply pressure due to pain; 01/03/22: grossly 3+/5 in Left shoulder; 01/20/22: grossly 4-/5 in left shoulder   ? Time 6   ? Period Weeks   ? Status On-going   ? Target Date 03/03/22   ? ?  ?  ? ?  ? ? ? ? ? ? ? ? Plan - 01/31/22 1535   ? ? Clinical Impression Statement Pt is pleasant and motivated throughout session. Interventions were progressed with resistance for elbow strengthening and added chest press. She reported pain in left upper trap upon arrival. Trigger point was identified; she had an excellent response to [facility] and demonstrated improved and pain-free cervical ROM. Pt will continue to benefit from  skilled PT to address limitations in strength, ROM and functional use for improved completion of ADLs, work tasks and quality of life.   ? Personal Factors and Comorbidities Age;Comorbidity 1;Fitness;Past/Current Experience;Time since onset of injury/illness/exacerbation   ? Comorbidities pre-diabetes   ? Examination-Activity Limitations Carry;Lift;Reach Overhead;Sleep   ?  Examination-Participation Restrictions Occupation;Driving;Community Activity;Cleaning;Laundry;Meal Prep   ? Stability/Clinical Decision Making Evolving/Moderate complexity   ? Rehab Potential Good   ? PT Frequency 2x / week   ? PT Duration 12 weeks   ? PT Treatment/Interventions ADLs/Self Care Home Management;Aquatic Therapy;Electrical Stimulation;Moist Heat;Ultrasound;DME Instruction;Functional mobility training;Therapeutic activities;Therapeutic exercise;Neuromuscular re-education;Patient/family education;Manual techniques;Passive range of motion;Energy conservation;Taping;Vasopneumatic Device;Joint Manipulations;Cryotherapy;Traction;Dry needling   ? PT Next Visit Plan Follow up on HEP issued, conitnue with basic shoulder strengthening in tolerated ranges   ? PT Home Exercise Plan 01/02/22: can conitnue with table slides daily for gentle mobility;  [id number]   ? Consulted and Agree with Plan of Care Patient   ? ?  ?  ? ?  ? ? ?Patient will benefit from skilled therapeutic intervention in order to improve the following deficits and impairments:  Decreased range of motion, Improper body mechanics, Decreased activity tolerance, Decreased strength, Impaired UE functional use, Decreased mobility, Difficulty walking, Decreased endurance, Postural dysfunction, Increased muscle spasms, Impaired flexibility, Impaired tone, Hypomobility, Increased fascial restricitons, Pain ? ?Visit Diagnosis: ?Stiffness of left shoulder, not elsewhere classified ? ?Muscle weakness (generalized) ? ?Acute pain of left shoulder ? ? ? ? ?Problem List ?Patient Active Problem  List  ? Diagnosis Date Noted  ? Screen for colon cancer   ? Muscle cramps 05/28/2018  ? Pre-diabetes 05/28/2018  ? Fatty liver 05/28/2018  ? Hair loss 05/28/2018  ? Sciatica 11/01/2015  ? S/P right oophorect

## 2022-02-02 ENCOUNTER — Ambulatory Visit: Payer: PRIVATE HEALTH INSURANCE | Admitting: Physical Therapy

## 2022-02-02 ENCOUNTER — Encounter: Payer: Self-pay | Admitting: Physical Therapy

## 2022-02-02 DIAGNOSIS — M6281 Muscle weakness (generalized): Secondary | ICD-10-CM

## 2022-02-02 DIAGNOSIS — M25612 Stiffness of left shoulder, not elsewhere classified: Secondary | ICD-10-CM

## 2022-02-02 DIAGNOSIS — M25512 Pain in left shoulder: Secondary | ICD-10-CM

## 2022-02-02 NOTE — Therapy (Signed)
[facility] ?[address] ?[place], [place], [address] ?Phone: [phone]   Fax:  [phone] ? ?Physical Therapy Treatment ? ?Patient Details  ?Name: Carolyn Sampson ?MRN: [id number] ?Date of Birth: [birth year] ?Referring Provider (PT): Tania Ade MD ? ? ?Encounter Date: 02/02/2022 ? ? PT End of Session - 02/02/22 1355   ? ? Visit Number 33   ? Number of Visits 41   ? Date for PT Re-Evaluation 02/16/22   ? Authorization Type WC   ? Authorization Time Period 12/20/21-01/10/22; 01/05/22-02/16/22   ? Progress Note Due on Visit 30   ? PT Start Time 9357   ? PT Stop Time 1430   ? PT Time Calculation (min) 41 min   ? Activity Tolerance Patient tolerated treatment well;No increased pain;Patient limited by fatigue   ? Behavior During Therapy [facility] for tasks assessed/performed   ? ?  ?  ? ?  ? ? ?Past Medical History:  ?Diagnosis Date  ? Fatty liver   ? Pre-diabetes   ? ? ?Past Surgical History:  ?Procedure Laterality Date  ? ABDOMINAL HYSTERECTOMY    ? APPENDECTOMY    ? CESAREAN SECTION    ? CESAREAN SECTION    ? COLONOSCOPY WITH PROPOFOL N/A 06/02/2021  ? Procedure: COLONOSCOPY WITH PROPOFOL;  Surgeon: Lin Landsman, MD;  Location: [facility] ENDOSCOPY;  Service: Gastroenterology;  Laterality: N/A;  [place] INTERPRETER  ? LAPAROSCOPIC HYSTERECTOMY    ? LAPAROSCOPIC OOPHERECTOMY Right   ? SHOULDER ARTHROSCOPY WITH ROTATOR CUFF REPAIR AND SUBACROMIAL DECOMPRESSION Left 08/22/2021  ? Procedure: SHOULDER ARTHROSCOPY WITH ROTATOR CUFF REPAIR AND SUBACROMIAL DECOMPRESSION;  Surgeon: Tania Ade, MD;  Location: [facility];  Service: Orthopedics;  Laterality: Left;  ? ? ?There were no vitals filed for this visit. ? ? Subjective Assessment - 02/02/22 1353   ? ? Subjective Pt states she has felt better since our last session. No longer reporting neck/upper trap pain. Denies pain at arrival.   ? Pertinent History Carolyn Sampson is a 15yoF who is referred  to OPPT for rehabilitation of left shoulder s/p Left supraspinatus repair and subacromial decompression 08/22/21 with Dr. Tamera Punt.   ? Currently in Pain? No/denies   ? ?  ?  ? ?  ? ? ?INTERVENTION:  ?Arm ergometer, level 1 for 4 minutes; change in direction midway. ?  ?*TENS application L shoulder throughout session ?  ?-Omega standing cable BUE row 10#, 2x10   ?-Omega chest press 5#, 2x10 ?            -used both arms with PT providing assist with eccentric control. Partial ROM for comfort.  ?  ?-LUE shoulder ABD through available range 2x10  ?-LUE shoulder flexion through available range 2x10 ?-GH ER RedTB, 2x10 ? ?-LUE elbow flexion 5# DB, 2x10 ?-LUE triceps extension 4# DB 2x10 ?-LUE bent over row 5# DB, 2x10 ?  ?- STM utilizing effleurage, p?trissage and trigger point release to anterior/lateral/posterior deltoid and supraspinatus;  x10 minutes. ?  ?  ?  ?Clinical Impression: Pt is pleasant and motivated throughout session. She tolerated exercises well and demonstrated improved abduction AROM - able to lift >90d during initial reps however ROM decreased with muscle fatigue. Would like to attempt adding light resistance to shoulder flexion and abduction next session. Transferred focus of STM to deltoid and supraspinatus - pt again responded well with immediate pain relief upon movement. Pt will continue to benefit from skilled PT to address  limitations in strength, ROM and functional use for improved completion of ADLs, work tasks and quality of life. ? ?  ? ? ? ? ? ? PT Short Term Goals - 01/20/22 1236   ? ?  ? PT SHORT TERM GOAL #1  ? Title Pt will be independent with HEP in order to improve strength and decrease pain in order to improve pain-free function at home and work.   ? Baseline 09/08/21 HEP given   ? Time 4   ? Period Weeks   ? Status Achieved   ?  ? PT SHORT TERM GOAL #3  ? Title Pt will improve shoulder flexion and abduction AROM by 15 degrees each indicationg progress with functional shoulder  elevation   ? Baseline 80ABD, 105 Flexion at IE, 50 abd and 50 flex at PN 10/10. 11/03/21: flexion:135, abduction 105. 11/29/21: flexion 110, abduction, 85, ER 55. 12/08/21: F 110, abd 88   ? Time 6   ? Period Weeks   ? Status Achieved   ? Target Date 01/10/22   ? ?  ?  ? ?  ? ? ? ? PT Long Term Goals - 01/20/22 1236   ? ?  ? PT LONG TERM GOAL #1  ? Title Patient will increase FOTO score to 44 to demonstrate predicted increase in functional mobility to complete ADLs   ? Baseline 09/08/21 4;  12/08/21: 43   ? Time 6   ? Period Weeks   ? Status On-going   ? Target Date 01/10/22   ?  ? PT LONG TERM GOAL #2  ? Title Pt will decrease worst pain as reported on NPRS by at least 3 points in order to demonstrate clinically significant reduction in pain.   ? Baseline 09/08/21 9/10. 11/03/21 5/10 NPS; 01/03/22: 5/10   ? Time 12   ? Period Weeks   ? Status Achieved   ? Target Date 09/05/21   ?  ? PT LONG TERM GOAL #3  ? Title Pt will demonstrate full active L shoulder motion in order to complete self care ADLS.   ? Baseline 09/08/21 unable to attempt. 11/03/21 limited. 11/29/21: flexion 110, abduction 85, ER 55. 12/08/21: F 110, abd 88; 01/20/22: flexion 112, abd 92   ? Time 6   ? Period Weeks   ? Status On-going   ? Target Date 03/03/22   ?  ? PT LONG TERM GOAL #4  ? Title Patient will demonstrate gross L shoulder MMTs of 4+/5 in order to be able to complete heavy household tasks   ? Baseline flexion 3+/5, abduction 4/5 in 20 degrees of abduction painfree range, extension 5/5 in pain free rane, IR 4/5, ER 3/5. 11/29/21: 3 to 3+/5 flexion, abduction and ER (partial ROM); 12/08/21: 3/5 MMT through incomplete range, unable to apply pressure due to pain; 01/03/22: grossly 3+/5 in Left shoulder; 01/20/22: grossly 4-/5 in left shoulder   ? Time 6   ? Period Weeks   ? Status On-going   ? Target Date 03/03/22   ? ?  ?  ? ?  ? ? ? ? ? ? ? ? Plan - 02/02/22 1713   ? ? Clinical Impression Statement Pt is pleasant and motivated throughout session. She tolerated  exercises well and demonstrated improved abduction AROM - able to lift >90d during initial reps however ROM decreased with muscle fatigue. Would like to attempt adding light resistance to shoulder flexion and abduction next session. Transferred focus of STM to deltoid and  supraspinatus - pt again responded well with immediate pain relief upon movement. Pt will continue to benefit from skilled PT to address limitations in strength, ROM and functional use for improved completion of ADLs, work tasks and quality of life.   ? Personal Factors and Comorbidities Age;Comorbidity 1;Fitness;Past/Current Experience;Time since onset of injury/illness/exacerbation   ? Comorbidities pre-diabetes   ? Examination-Activity Limitations Carry;Lift;Reach Overhead;Sleep   ? Examination-Participation Restrictions Occupation;Driving;Community Activity;Cleaning;Laundry;Meal Prep   ? Stability/Clinical Decision Making Evolving/Moderate complexity   ? Rehab Potential Good   ? PT Frequency 2x / week   ? PT Duration 12 weeks   ? PT Treatment/Interventions ADLs/Self Care Home Management;Aquatic Therapy;Electrical Stimulation;Moist Heat;Ultrasound;DME Instruction;Functional mobility training;Therapeutic activities;Therapeutic exercise;Neuromuscular re-education;Patient/family education;Manual techniques;Passive range of motion;Energy conservation;Taping;Vasopneumatic Device;Joint Manipulations;Cryotherapy;Traction;Dry needling   ? PT Next Visit Plan Conitnue with basic shoulder strengthening in tolerated ranges   ? PT Home Exercise Plan 01/02/22: can conitnue with table slides daily for gentle mobility;  [email or web address]   ? Consulted and Agree with Plan of Care Patient   ? ?  ?  ? ?  ? ? ?Patient will benefit from skilled therapeutic intervention in order to improve the following deficits and impairments:  Decreased range of motion, Improper body mechanics, Decreased activity tolerance, Decreased strength, Impaired UE functional use, Decreased mobility,  Difficulty walking, Decreased endurance, Postural dysfunction, Increased muscle spasms, Impaired flexibility, Impaired tone, Hypomobility, Increased fascial restricitons, Pain ? ?Visit Diagnosis: ?Stiffness o

## 2022-02-07 ENCOUNTER — Encounter: Payer: Medicaid Other | Admitting: Physical Therapy

## 2022-02-09 ENCOUNTER — Encounter: Payer: Medicaid Other | Admitting: Physical Therapy

## 2022-02-14 ENCOUNTER — Ambulatory Visit: Payer: PRIVATE HEALTH INSURANCE | Admitting: Physical Therapy

## 2022-02-14 ENCOUNTER — Encounter: Payer: Self-pay | Admitting: Physical Therapy

## 2022-02-14 DIAGNOSIS — M25612 Stiffness of left shoulder, not elsewhere classified: Secondary | ICD-10-CM

## 2022-02-14 DIAGNOSIS — M6281 Muscle weakness (generalized): Secondary | ICD-10-CM

## 2022-02-14 DIAGNOSIS — M25512 Pain in left shoulder: Secondary | ICD-10-CM

## 2022-02-14 NOTE — Therapy (Signed)
[facility] ?[address] ?[place], [place], [address] ?Phone: [phone]   Fax:  [phone] ? ?Physical Therapy Treatment ? ?Patient Details  ?Name: Carolyn Sampson ?MRN: [id number] ?Date of Birth: Nov 24, 1960 ?Referring Provider (PT): Tania Ade MD ? ? ?Encounter Date: 02/14/2022 ? ? ? ?Past Medical History:  ?Diagnosis Date  ? Fatty liver   ? Pre-diabetes   ? ? ?Past Surgical History:  ?Procedure Laterality Date  ? ABDOMINAL HYSTERECTOMY    ? APPENDECTOMY    ? CESAREAN SECTION    ? CESAREAN SECTION    ? COLONOSCOPY WITH PROPOFOL N/A 06/02/2021  ? Procedure: COLONOSCOPY WITH PROPOFOL;  Surgeon: Lin Landsman, MD;  Location: [facility] ENDOSCOPY;  Service: Gastroenterology;  Laterality: N/A;  [place] INTERPRETER  ? LAPAROSCOPIC HYSTERECTOMY    ? LAPAROSCOPIC OOPHERECTOMY Right   ? SHOULDER ARTHROSCOPY WITH ROTATOR CUFF REPAIR AND SUBACROMIAL DECOMPRESSION Left 08/22/2021  ? Procedure: SHOULDER ARTHROSCOPY WITH ROTATOR CUFF REPAIR AND SUBACROMIAL DECOMPRESSION;  Surgeon: Tania Ade, MD;  Location: [facility];  Service: Orthopedics;  Laterality: Left;  ? ? ?There were no vitals filed for this visit. ? ? Subjective Assessment - 02/14/22 1354   ? ? Subjective Patinet reports minimal pain, only 1/10 pain this pm. Patient reports continued difficulty with driving, carrying, and reports her arm falls when she tries to old it up to do anything.   ? Patient is accompained by: Interpreter   ? Pertinent History Carolyn Sampson is a 39yoF who is referred to OPPT for rehabilitation of left shoulder s/p Left supraspinatus repair and subacromial decompression 08/22/21 with Dr. Tamera Punt.   ? Limitations Lifting;Reading;Writing;Walking;House hold activities   ? How long can you sit comfortably? unlimited   ? How long can you stand comfortably? unlimited   ? How long can you walk comfortably? unlimited   ? Diagnostic tests none post surgery   ?  Patient Stated Goals restore motion/strength in shoulder in order to complete normal household tasks   ? Pain Onset 1 to 4 weeks ago   ? ?  ?  ? ?  ? ? ? ?INTERVENTION:  ?Arm ergometer, level 1 for 4 minutes; change in direction midway. ?  ?Access Code: [email or web address] ?URL: [email or web address] ?Date: 02/14/2022 ?Prepared by: Durwin Reges ? ?PT reviewed the following HEP with patient with patient able to demonstrate a set of the following with min cuing for correction needed. PT educated patient on parameters of therex (how/when to inc/decrease intensity, frequency, rep/set range, stretch hold time, and purpose of therex) with verbalized understanding.  ?- Seated Shoulder Flexion AAROM with Pulley Behind  - 1-2 x daily - 7 x weekly - 12 reps ?- Seated Shoulder Abduction AAROM with Pulley Behind  - 1-2 x daily - 7 x weekly - 12 reps ?- Standing Shoulder Internal Rotation AAROM with Pulley  - 1-2 x daily - 7 x weekly - 12 reps ?- Seated Thoracic Lumbar Extension  - 1-2 x daily - 7 x weekly - 12 reps ?- Standing Row with Resistance with Anchored Resistance at Chest Height [place] Down  - 1 x daily - 2 x weekly - 3 sets - 6-10 reps ?- Standing Shoulder Horizontal Abduction with Resistance  - 1 x daily - 2 x weekly - 3 sets - 6-10 reps ?- Low Trap Setting at [place]  - 1 x daily - 2 x weekly - 3 sets - 6-10 reps ?- Shoulder External Rotation and Scapular Retraction  with Resistance  - 1 x daily - 2 x weekly - 3 sets - 10 reps ? ? ? ? ? ? ? ? ? ? ? ? ? ? ? ? ? ? ? ? ? ? PT Education - 02/14/22 1357   ? ? Education Details HEP update   ? Person(s) Educated Patient   ? Methods Explanation;Demonstration;Verbal cues   ? Comprehension Verbalized understanding;Returned demonstration;Verbal cues required   ? ?  ?  ? ?  ? ? ? PT Short Term Goals - 01/20/22 1236   ? ?  ? PT SHORT TERM GOAL #1  ? Title Pt will be independent with HEP in order to improve strength and decrease pain in order to improve pain-free function at  home and work.   ? Baseline 09/08/21 HEP given   ? Time 4   ? Period Weeks   ? Status Achieved   ?  ? PT SHORT TERM GOAL #3  ? Title Pt will improve shoulder flexion and abduction AROM by 15 degrees each indicationg progress with functional shoulder elevation   ? Baseline 80ABD, 105 Flexion at IE, 50 abd and 50 flex at PN 10/10. 11/03/21: flexion:135, abduction 105. 11/29/21: flexion 110, abduction, 85, ER 55. 12/08/21: F 110, abd 88   ? Time 6   ? Period Weeks   ? Status Achieved   ? Target Date 01/10/22   ? ?  ?  ? ?  ? ? ? ? PT Long Term Goals - 02/14/22 1358   ? ?  ? PT LONG TERM GOAL #1  ? Title Patient will increase FOTO score to 44 to demonstrate predicted increase in functional mobility to complete ADLs   ? Baseline 09/08/21 4;  12/08/21: 43;   ? Time 6   ? Period Weeks   ?  ? PT LONG TERM GOAL #2  ? Title Pt will decrease worst pain as reported on NPRS by at least 3 points in order to demonstrate clinically significant reduction in pain.   ? Baseline 09/08/21 9/10. 11/03/21 5/10 NPS; 01/03/22: 5/10; 02/14/22 6/10   ? Time 12   ? Period Weeks   ? Status Achieved   ?  ? PT LONG TERM GOAL #3  ? Title Pt will demonstrate full active L shoulder motion in order to complete self care ADLS.   ? Baseline 09/08/21 unable to attempt. 11/03/21 limited. 11/29/21: flexion 110, abduction 85, ER 55. 12/08/21: F 110, abd 88; 01/20/22: flexion 112, abd 92; 02/14/22 01/20/22: flexion 125, abd 106; ER C6; IR L2   ? Time 6   ? Period Weeks   ? Status On-going   ? Target Date 03/31/22   ?  ? PT LONG TERM GOAL #4  ? Title Patient will demonstrate gross L shoulder MMTs of 4+/5 in order to be able to complete heavy household tasks   ? Baseline flexion 3+/5, abduction 4/5 in 20 degrees of abduction painfree range, extension 5/5 in pain free rane, IR 4/5, ER 3/5. 11/29/21: 3 to 3+/5 flexion, abduction and ER (partial ROM); 12/08/21: 3/5 MMT through incomplete range, unable to apply pressure due to pain; 01/03/22: grossly 3+/5 in Left shoulder; 01/20/22: grossly  4-/5 in left shoulder; 02/14/22 within range: flex 4/5; abd 4-/5; IR 4/5; ER 4-/5   ? Time 6   ? Period Weeks   ? Status On-going   ? Target Date 03/31/22   ? ?  ?  ? ?  ? ? ? ? ? ? ? ?  Plan - 02/14/22 1435   ? ? Clinical Impression Statement PT reassessed patient today for cert compliance. Patinet with increased strength in under 90d, with minimal strength above 90d in any plain. She is increasing AROM, but is still not full for overhead ADLs. PT updated HEP to reflect ongoing goals of overhead mobility, strength and endurance. Patient is able to demonstrate all therex and verbalize understanding of education on parameters of HEP. Patient will continue to benefit from skilled PT 2x/week for 6 weeks to continue progress toward goals as needed for ADLs and occupation.   ? Personal Factors and Comorbidities Age;Comorbidity 1;Fitness;Past/Current Experience;Time since onset of injury/illness/exacerbation   ? Comorbidities pre-diabetes   ? Examination-Activity Limitations Carry;Lift;Reach Overhead;Sleep   ? Examination-Participation Restrictions Occupation;Driving;Community Activity;Cleaning;Laundry;Meal Prep   ? Stability/Clinical Decision Making Evolving/Moderate complexity   ? Clinical Decision Making Moderate   ? Rehab Potential Good   ? PT Frequency 2x / week   ? PT Duration 12 weeks   ? PT Treatment/Interventions ADLs/Self Care Home Management;Aquatic Therapy;Electrical Stimulation;Moist Heat;Ultrasound;DME Instruction;Functional mobility training;Therapeutic activities;Therapeutic exercise;Neuromuscular re-education;Patient/family education;Manual techniques;Passive range of motion;Energy conservation;Taping;Vasopneumatic Device;Joint Manipulations;Cryotherapy;Traction;Dry needling   ? PT Next Visit Plan Conitnue with basic shoulder strengthening in tolerated ranges   ? PT Home Exercise Plan 01/02/22: can conitnue with table slides daily for gentle mobility;  [email or web address]   ? Consulted and Agree with Plan of Care  Patient   ? ?  ?  ? ?  ? ? ?Patient will benefit from skilled therapeutic intervention in order to improve the following deficits and impairments:  Decreased range of motion, Improper body mechanics, Decreased a

## 2022-02-22 ENCOUNTER — Ambulatory Visit: Payer: PRIVATE HEALTH INSURANCE | Admitting: Physical Therapy

## 2022-03-02 ENCOUNTER — Encounter: Payer: Medicaid Other | Admitting: Physical Therapy

## 2022-03-07 ENCOUNTER — Encounter: Payer: Medicaid Other | Admitting: Physical Therapy

## 2022-03-08 ENCOUNTER — Encounter: Payer: Medicaid Other | Admitting: Physical Therapy

## 2022-03-08 ENCOUNTER — Other Ambulatory Visit: Payer: Self-pay | Admitting: Orthopedic Surgery

## 2022-03-08 DIAGNOSIS — M25512 Pain in left shoulder: Secondary | ICD-10-CM

## 2022-03-14 ENCOUNTER — Encounter: Payer: Medicaid Other | Admitting: Physical Therapy

## 2022-03-16 ENCOUNTER — Encounter: Payer: Medicaid Other | Admitting: Physical Therapy

## 2022-03-18 ENCOUNTER — Ambulatory Visit
Admission: RE | Admit: 2022-03-18 | Discharge: 2022-03-18 | Disposition: A | Discharge status: Home / Self Care | Payer: PRIVATE HEALTH INSURANCE | Source: Ambulatory Visit | Attending: Orthopedic Surgery | Admitting: Orthopedic Surgery

## 2022-03-18 DIAGNOSIS — M25512 Pain in left shoulder: Secondary | ICD-10-CM | POA: Diagnosis present

## 2022-03-18 IMAGING — MR MR SHOULDER*L* W/O CM
4 of 5 series · 31 of 40 positions shown · non-contrast
Comparison: MRI left shoulder [DATE]

CLINICAL DATA: Status post left shoulder surgery [DATE]. Left
shoulder pain and weakness.

EXAM:
MRI OF THE LEFT SHOULDER WITHOUT CONTRAST
TECHNIQUE: Multiplanar, multisequence MR imaging of the shoulder was performed.
No intravenous contrast was administered.

[Series 5: T2 fat-sat · axial · left · 4.0mm · 0.44mm/px · z∈[-49,+69]mm · 8 of 26 slices shown (1 of 3)]
[im 1/26]
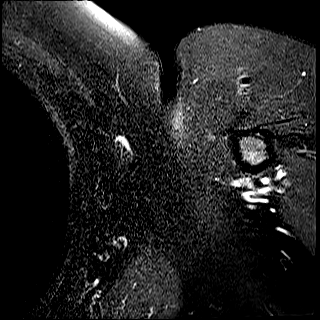
[im 4/26]
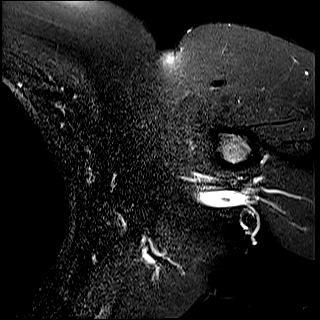
[im 8/26]
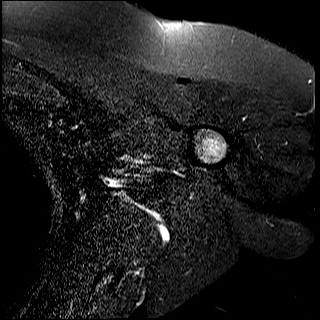
[im 11/26]
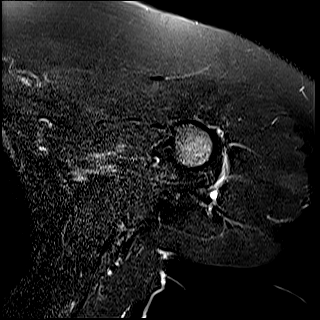
[im 15/26]
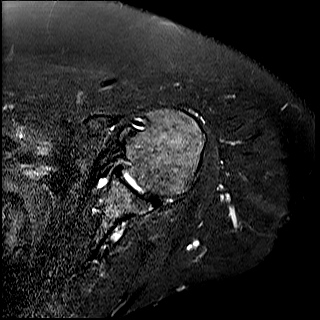
[im 18/26]
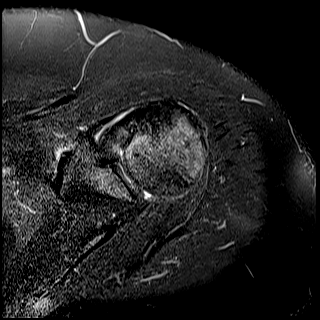
[im 22/26]
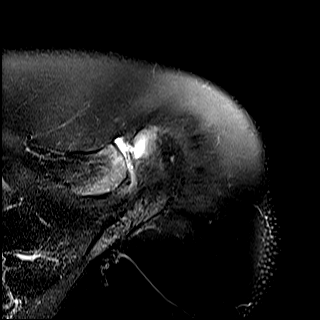
[im 26/26]
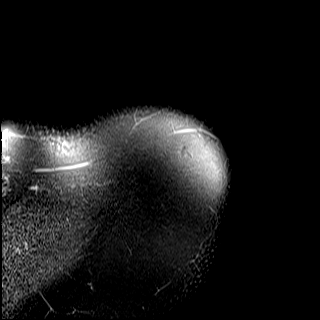

[Series 6: PD · oblique · left · 4.0mm · 0.44mm/px · 9 of 26 slices shown]
[im 1/26]
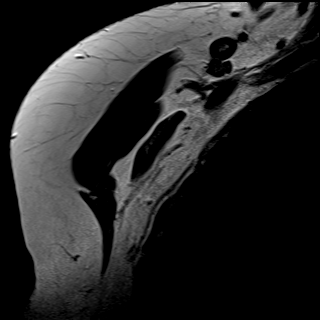
[im 4/26]
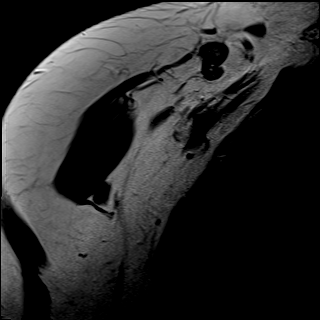
[im 7/26]
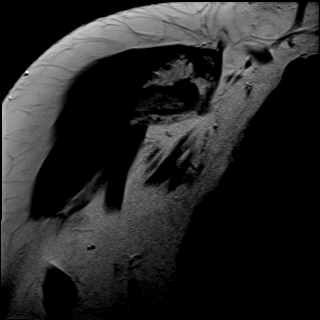
[im 10/26]
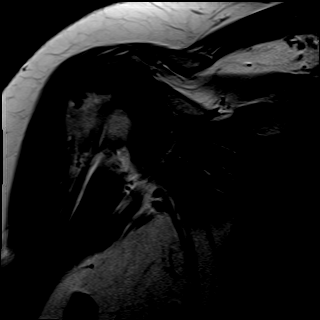
[im 13/26]
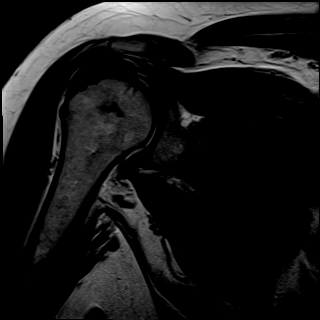
[im 16/26]
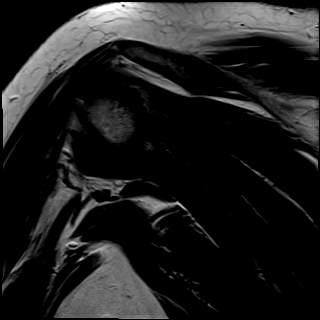
[im 19/26]
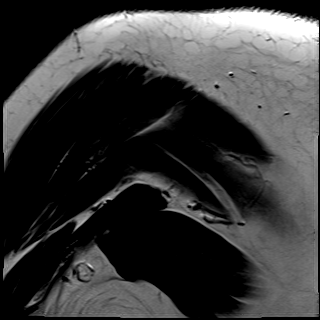
[im 22/26]
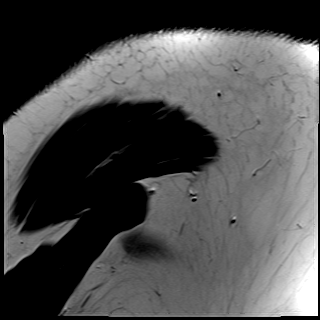
[im 26/26]
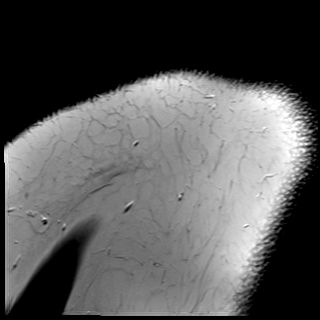

[Series 7: T2 fat-sat · oblique · left · 4.0mm · 0.44mm/px · 9 of 26 slices shown (2 of 3)]
[im 1/26]
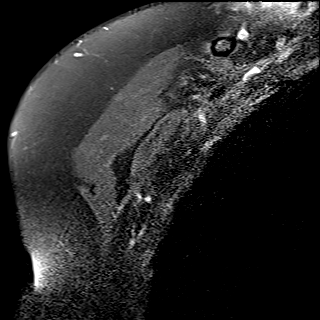
[im 4/26]
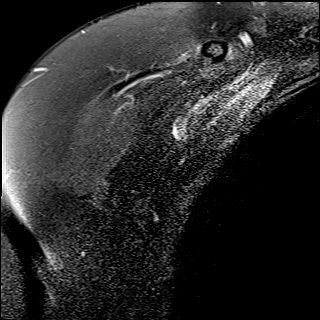
[im 7/26]
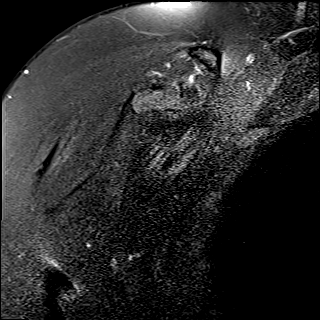
[im 10/26]
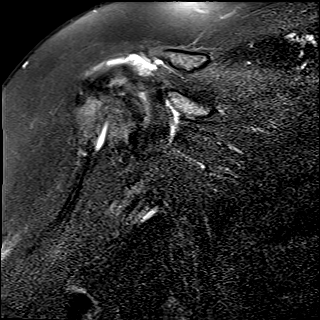
[im 13/26]
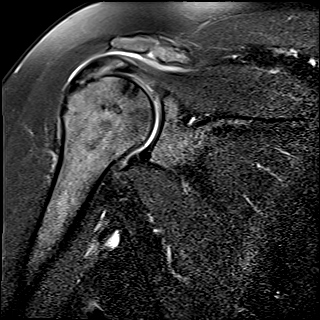
[im 16/26]
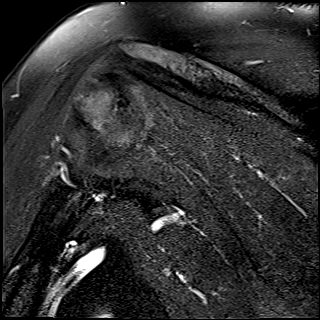
[im 19/26]
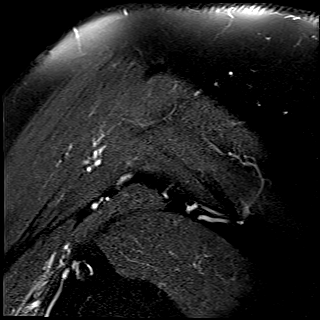
[im 22/26]
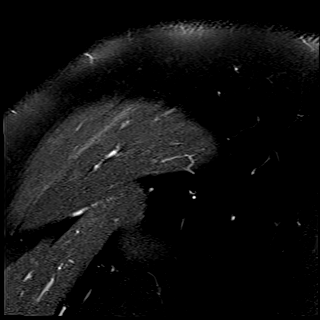
[im 26/26]
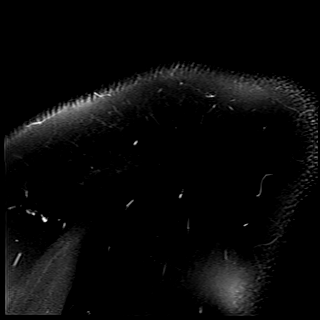

[Series 8: T2 fat-sat · oblique · left · 4.0mm · 0.22mm/px · 5 of 22 slices shown (3 of 3)]
[im 1/22]
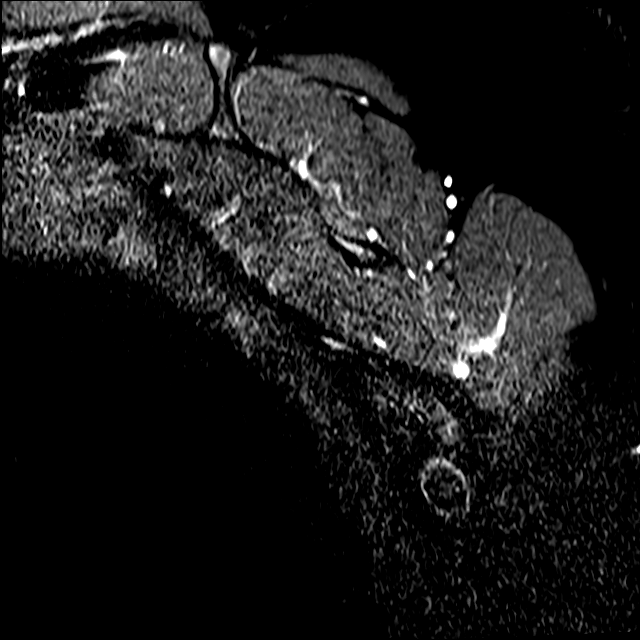
[im 4/22]
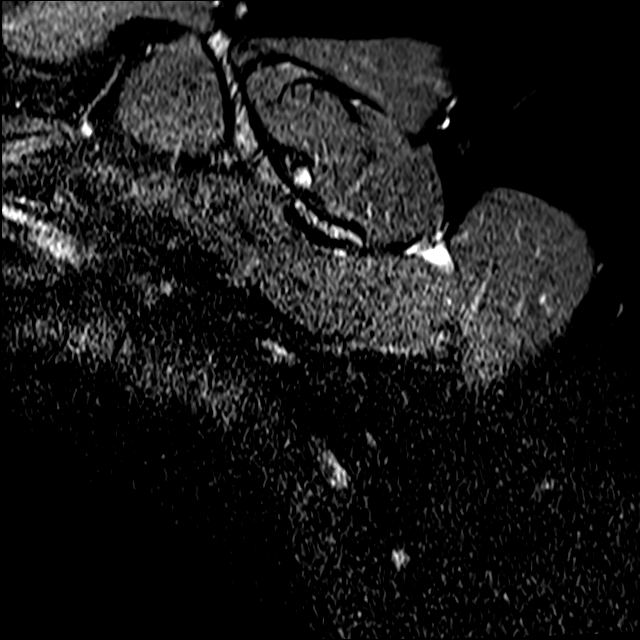
[im 8/22]
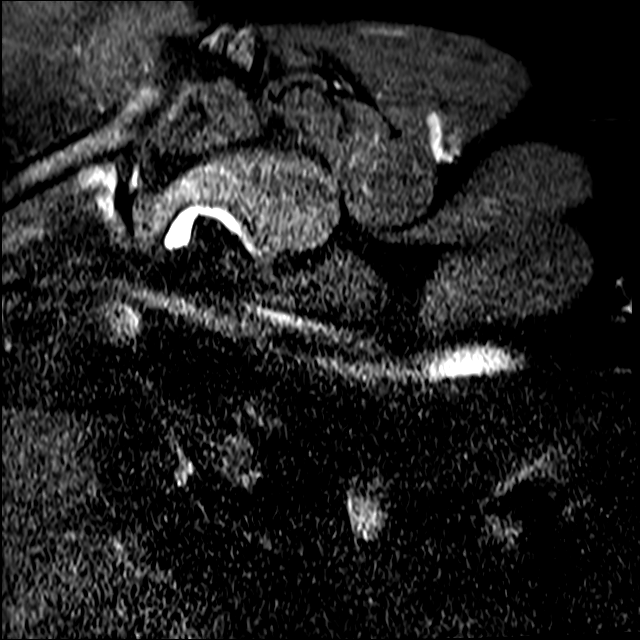
[im 11/22]
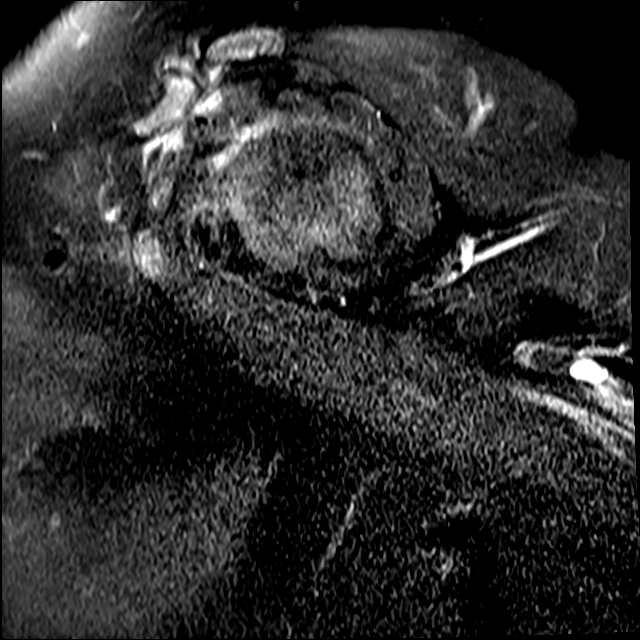
[im 18/22]
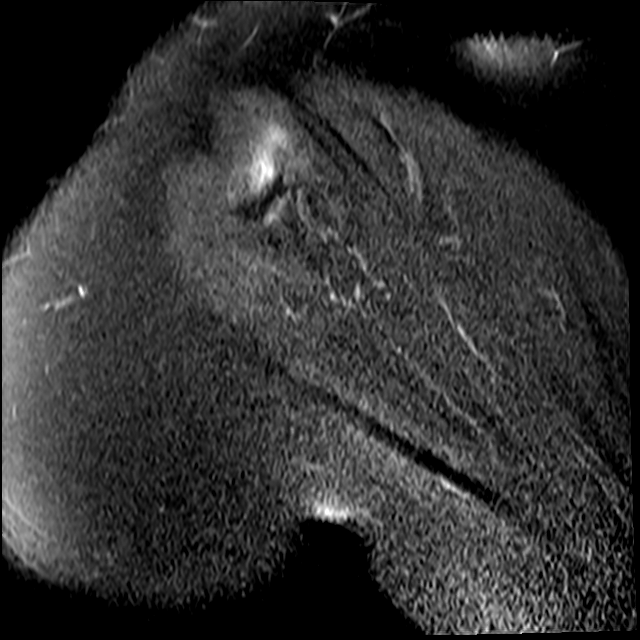

[31 of 40 positions shown; findings below may reference images not displayed]

FINDINGS: Rotator cuff: There are 2 new screw anchors within the anterior
greater tuberosity. The more superior screw anchor fixates the
anterior supraspinatus tendon footprint insertion where a high-grade
partial-thickness tear was previously seen. There is moderate to
high-grade intermediate T2 signal and enlargement of the anterior
supraspinatus tendon likely chronic tendinosis. The prior fluid
bright high-grade partial-thickness tear is no longer visualized. No
significant tendon retraction. Moderate anterior infraspinatus
intermediate T2 signal tendinosis. Resolution of the prior trace
fluid bright signal at the anterior deep aspect of the infraspinatus
musculotendinous junction. Mild superior subscapularis chronic
tendinosis. The teres minor is intact.

Muscles: No rotator cuff muscle atrophy, fatty infiltration, or
edema.

Biceps long head: The proximal long head of the biceps tendon again
demonstrates moderate intermediate T2 signal tendinosis.

Acromioclavicular Joint: Likely partial distal clavicle excision and
possible mild acromioplasty. Type 2 acromion. Mild-to-moderate fluid
within the subacromial/subdeltoid bursa, similar to prior.

Glenohumeral Joint: Mild glenoid and humeral head cartilage
thinning.

Labrum: Grossly intact, but evaluation is limited by lack of
intraarticular fluid.

Bones:  No acute fracture.

Other: None.
IMPRESSION: Compared to [DATE]:

1. Interval anterior supraspinatus tendon repair. The prior
high-grade partial-thickness tear is no longer seen. There is
moderate to high-grade anterior supraspinatus chronic tendinosis.
2. Moderate anterior infraspinatus and mild superior subscapularis
chronic tendinosis.
3. Moderate proximal long head of the biceps tendinosis.
4. Likely interval partial distal clavicle excision and mild
acromioplasty.
5. Mild-to-moderate subacromial/subdeltoid bursitis, similar to
prior.

## 2022-03-21 ENCOUNTER — Encounter: Payer: Medicaid Other | Admitting: Physical Therapy

## 2022-03-23 ENCOUNTER — Encounter: Payer: Medicaid Other | Admitting: Physical Therapy

## 2022-03-28 ENCOUNTER — Encounter: Payer: Medicaid Other | Admitting: Physical Therapy

## 2022-03-30 ENCOUNTER — Encounter: Payer: Medicaid Other | Admitting: Physical Therapy

## 2023-04-19 ENCOUNTER — Ambulatory Visit: Payer: Medicaid Other | Attending: Family Medicine | Admitting: Physical Therapy

## 2023-04-19 DIAGNOSIS — G8929 Other chronic pain: Secondary | ICD-10-CM | POA: Insufficient documentation

## 2023-04-19 DIAGNOSIS — M25612 Stiffness of left shoulder, not elsewhere classified: Secondary | ICD-10-CM | POA: Insufficient documentation

## 2023-04-19 DIAGNOSIS — M25512 Pain in left shoulder: Secondary | ICD-10-CM | POA: Diagnosis present

## 2023-04-19 NOTE — Therapy (Signed)
OUTPATIENT PHYSICAL THERAPY SHOULDER EVALUATION   Patient Name: Carolyn Sampson MRN: [id number] DOB:1961/09/24, 62 y.o., female Today's Date: 04/19/2023  END OF SESSION:  PT End of Session - 04/19/23 1611     Visit Number 1    Number of Visits 20    Date for PT Re-Evaluation 06/28/23    Authorization Type [place] [facility] Medicaid    Authorization Time Period 04/19/23-06/28/23    Authorization - Visit Number 1    Authorization - Number of Visits 20    Progress Note Due on Visit 10    PT Start Time 1300    PT Stop Time 1345    PT Time Calculation (min) 45 min    Activity Tolerance Patient limited by pain    Behavior During Therapy WFL for tasks assessed/performed             Past Medical History:  Diagnosis Date   Fatty liver    Pre-diabetes    Past Surgical History:  Procedure Laterality Date   ABDOMINAL HYSTERECTOMY     APPENDECTOMY     CESAREAN SECTION     CESAREAN SECTION     COLONOSCOPY WITH PROPOFOL N/A 06/02/2021   Procedure: COLONOSCOPY WITH PROPOFOL;  Surgeon: Toney Reil, MD;  Location: [facility];  Service: Gastroenterology;  Laterality: N/A;  SPANISH INTERPRETER   LAPAROSCOPIC HYSTERECTOMY     LAPAROSCOPIC OOPHERECTOMY Right    SHOULDER ARTHROSCOPY WITH ROTATOR CUFF REPAIR AND SUBACROMIAL DECOMPRESSION Left 08/22/2021   Procedure: SHOULDER ARTHROSCOPY WITH ROTATOR CUFF REPAIR AND SUBACROMIAL DECOMPRESSION;  Surgeon: Jones Broom, MD;  Location: [facility];  Service: Orthopedics;  Laterality: Left;   Patient Active Problem List   Diagnosis Date Noted   Screen for colon cancer    Muscle cramps 05/28/2018   Pre-diabetes 05/28/2018   Fatty liver 05/28/2018   Hair loss 05/28/2018   Sciatica 11/01/2015   S/P right oophorectomy 11/01/2015   S/P hysterectomy 11/01/2015    PCP: No PCP   REFERRING PROVIDER: Dr. Berenda Morale   REFERRING DIAG: Left Shoulder Pain   THERAPY DIAG:  Chronic left shoulder pain - Plan: PT plan of care  cert/re-cert  Rationale for Evaluation and Treatment: Rehabilitation  ONSET DATE: 07/17/2020   SUBJECTIVE:                                                                                                                                                                                      SUBJECTIVE STATEMENT: See pertinent history  Hand dominance: Right  PERTINENT HISTORY: Pt initially sustained a left shoulder injury while at work around July 2021. She had requested that a physician's note be  given to her employeer to provide working restrictions, but her employeer did not respect her restrictions. As a result, she suffered a left supraspinatus tear. She then had a left shoulder supraspinatus repair on June of 2023 and she was having work comp pay but this ran out and she still had left shoulder deficits.     PAIN:  Are you having pain? Yes: NPRS scale: 7/10 Pain location: Left shoulder on lateral deltoid  Pain description: Achy  Aggravating factors: Moving the left shoulder in the wrong way-abduction and flexion  Relieving factors: Hold it by her side   PRECAUTIONS: None  RED FLAGS: None of the red flags are applicable  None   WEIGHT BEARING RESTRICTIONS: No  FALLS:  Has patient fallen in last 6 months? No  LIVING ENVIRONMENT: Lives with: lives with their daughter Lives in: House/apartment Stairs: Yes: External: 4-5 steps; on right going up Has following equipment at home: None  OCCUPATION: She is on disability, so not working now   PLOF: Independent  PATIENT GOALS: She wants to be able to use left shoulder again so she can return to work and to reduce left shoulder pain.   NEXT MD VISIT: September 2024 , Dr. Marisa Sprinkles is her PCP   OBJECTIVE:   VITALS: BP HR 69 SpO2 100  DIAGNOSTIC FINDINGS:  CLINICAL DATA:  Status post left shoulder surgery 08/2021. Left shoulder pain and weakness.   EXAM: MRI OF THE LEFT SHOULDER WITHOUT CONTRAST   TECHNIQUE: Multiplanar,  multisequence MR imaging of the shoulder was performed. No intravenous contrast was administered.   COMPARISON:  MRI left shoulder 07/12/2021   FINDINGS: Rotator cuff: There are 2 new screw anchors within the anterior greater tuberosity. The more superior screw anchor fixates the anterior supraspinatus tendon footprint insertion where a high-grade partial-thickness tear was previously seen. There is moderate to high-grade intermediate T2 signal and enlargement of the anterior supraspinatus tendon likely chronic tendinosis. The prior fluid bright high-grade partial-thickness tear is no longer visualized. No significant tendon retraction. Moderate anterior infraspinatus intermediate T2 signal tendinosis. Resolution of the prior trace fluid bright signal at the anterior deep aspect of the infraspinatus musculotendinous junction. Mild superior subscapularis chronic tendinosis. The teres minor is intact.   Muscles: No rotator cuff muscle atrophy, fatty infiltration, or edema.   Biceps long head: The proximal long head of the biceps tendon again demonstrates moderate intermediate T2 signal tendinosis.   Acromioclavicular Joint: Likely partial distal clavicle excision and possible mild acromioplasty. Type 2 acromion. Mild-to-moderate fluid within the subacromial/subdeltoid bursa, similar to prior.   Glenohumeral Joint: Mild glenoid and humeral head cartilage thinning.   Labrum: Grossly intact, but evaluation is limited by lack of intraarticular fluid.   Bones:  No acute fracture.   Other: None.   IMPRESSION: Compared to 07/12/2021:   1. Interval anterior supraspinatus tendon repair. The prior high-grade partial-thickness tear is no longer seen. There is moderate to high-grade anterior supraspinatus chronic tendinosis. 2. Moderate anterior infraspinatus and mild superior subscapularis chronic tendinosis. 3. Moderate proximal long head of the biceps tendinosis. 4. Likely  interval partial distal clavicle excision and mild acromioplasty. 5. Mild-to-moderate subacromial/subdeltoid bursitis, similar to prior.     Electronically Signed   By: Neita Garnet M.D.   On: 03/20/2022 12:28    PATIENT SURVEYS:  FOTO 37/100 with target of 61   COGNITION: Overall cognitive status: Within functional limits for tasks assessed     SENSATION: WFL  POSTURE: No deficits   UPPER EXTREMITY  ROM:                           Active ROM Right eval Left eval  Shoulder flexion 180 90  Shoulder extension    Shoulder abduction 180 90  Shoulder adduction    Shoulder internal rotation    Shoulder external rotation    Elbow flexion    Elbow extension    Wrist flexion    Wrist extension    Wrist ulnar deviation    Wrist radial deviation    Wrist pronation    Wrist supination    (Blank rows = not tested)                                                         UPPER EXTREMITY MMT:  MMT Right eval Left eval  Shoulder flexion 4 3+  Shoulder extension    Shoulder abduction    Shoulder adduction 4 3+  Shoulder internal rotation 4 4-  Shoulder external rotation 4 4  Middle trapezius    Lower trapezius    Elbow flexion    Elbow extension    Wrist flexion    Wrist extension    Wrist ulnar deviation    Wrist radial deviation    Wrist pronation    Wrist supination    Grip strength (lbs)    (Blank rows = not tested)   JOINT MOBILITY TESTING:  Deferred until next session   PALPATION:  Anterior and lateral portion of left shoulder TTP    TODAY'S TREATMENT:                                                                                                                                         DATE:   04/19/23: All single UE exercises performed on LUE  Shoulder Flex/ Ext AAROM Pulleys 3 x 10  Shoulder Abd/Add AAROM Pulleys 3 x 10  Left Upper Trap Stretch 2 x 30 sec  Shoulder Flexion Isometric 1 x 10 with 5 sec hold  Shoulder Abduction Isometric 1  x 10 with 5 sec hold    PATIENT EDUCATION: Education details: form and technique for correct performance of exercise  Person educated: Patient Education method: Explanation, Demonstration, Verbal cues, and Handouts Education comprehension: verbalized understanding, returned demonstration, and verbal cues required  HOME EXERCISE PROGRAM: Access Code: [email or web address] URL: [email or web address] Date: 04/19/2023 Prepared by: Ellin Goodie  Exercises - Seated Upper Trapezius Stretch  - 1 x daily - 3 reps - 30-60 sec  hold - Seated Shoulder Flexion AAROM with Pulley Behind  - 1 x daily - 3 sets - 10 reps - Seated Shoulder Abduction AAROM with Pulley Behind  - 1 x  daily - 3 sets - 10 reps - Isometric Shoulder Flexion at Wall  - 1 x weekly - 2 sets - 10 reps - 5 sec hold hold - Isometric Shoulder Abduction at Wall  - 1 x daily - 2 sets - 10 reps - 5 sec  hold  ASSESSMENT:  CLINICAL IMPRESSION: Patient is a 62 y.o. hispanic female who was seen today for physical therapy evaluation and treatment for left shoulder pain s/p 1 year left supraspinatus repair. She demonstrates significant decrease in left shoulder ROM and strength along with left shoulder pain with movement. PT recommends additional medical screening from orthopedist to determine explanation for why shoulder surgery did not help with left shoulder deficits. Pt is in agreement with this plan. She will benefit form skilled PT to address these aforementioned deficits to return to using left shoulder for reaching, pushing, and pulling tasks for improved UE function to return work and cooking and cleaning at home.  OBJECTIVE IMPAIRMENTS: decreased ROM, decreased strength, impaired UE functional use, and pain.   ACTIVITY LIMITATIONS: carrying, lifting, dressing, reach over head, and hygiene/grooming  PARTICIPATION LIMITATIONS: meal prep, cleaning, and laundry  PERSONAL FACTORS: Age, Past/current experiences, and Time since onset  of injury/illness/exacerbation are also affecting patient's functional outcome.   REHAB POTENTIAL: Fair need ortho consult for better idea of prognosis   CLINICAL DECISION MAKING: Stable/uncomplicated  EVALUATION COMPLEXITY: Moderate   GOALS: Goals reviewed with patient? No  SHORT TERM GOALS: Target date: 05/03/2023  Pt will be independent with HEP in order to improve strength and balance in order to decrease fall risk and improve function at home and work. Baseline: NT  Goal status: INITIAL   LONG TERM GOALS: Target date: 06/28/2023  Patient will have improved function and activity level as evidenced by an increase in FOTO score by 10 points or more.  Baseline: 37/100 with target of 61 Goal status: INITIAL  2.  Pt will demonstrate full active L shoulder motion with 180 deg abduction and flexion in order to complete self care ADLS.  Baseline: Shoulder Flex L 90, Shoulder Abd 90   Goal status: INITIAL  3.  Patient will demonstrate gross L shoulder MMTs of 4+/5 in order to be able to complete heavy household tasks.  Baseline: Shoulder Flex R/L 4/3+, Shoulder Abd R/L 4/3+  Goal status: INITIAL    PLAN:  PT FREQUENCY: 1-2x/week  PT DURATION: 10 weeks  PLANNED INTERVENTIONS: Therapeutic exercises, Therapeutic activity, Neuromuscular re-education, Patient/Family education, Self Care, Joint mobilization, Joint manipulation, Dry Needling, Electrical stimulation, Spinal manipulation, Spinal mobilization, Cryotherapy, Moist heat, Manual therapy, and Re-evaluation  PLAN FOR NEXT SESSION: Joint mobility assessment, continue to progress ROM and strength exercises. Assess parascapular strength. OMEGA Seated Rows   Ellin Goodie PT, DPT  [facility] [address], [place], [address] Phone: [phone]   Fax:  [phone]

## 2023-04-25 ENCOUNTER — Ambulatory Visit: Payer: Medicaid Other | Admitting: Physical Therapy

## 2023-04-25 DIAGNOSIS — M25612 Stiffness of left shoulder, not elsewhere classified: Secondary | ICD-10-CM

## 2023-04-25 DIAGNOSIS — G8929 Other chronic pain: Secondary | ICD-10-CM

## 2023-04-25 DIAGNOSIS — M25512 Pain in left shoulder: Secondary | ICD-10-CM | POA: Diagnosis not present

## 2023-04-25 NOTE — Therapy (Signed)
OUTPATIENT PHYSICAL THERAPY SHOULDER TREATMENT    Patient Name: Carolyn Sampson MRN: [id number] DOB:25-Oct-1960, 62 y.o., female Today's Date: 04/25/2023  END OF SESSION:  PT End of Session - 04/25/23 1304     Visit Number 2    Number of Visits 20    Date for PT Re-Evaluation 06/28/23    Authorization Type [place] [facility] Medicaid    Authorization Time Period 04/19/23-06/28/23    Authorization - Visit Number 2    Authorization - Number of Visits 20    Progress Note Due on Visit 10    PT Start Time 1300    PT Stop Time 1345    PT Time Calculation (min) 45 min    Activity Tolerance Patient limited by pain    Behavior During Therapy WFL for tasks assessed/performed             Past Medical History:  Diagnosis Date   Fatty liver    Pre-diabetes    Past Surgical History:  Procedure Laterality Date   ABDOMINAL HYSTERECTOMY     APPENDECTOMY     CESAREAN SECTION     CESAREAN SECTION     COLONOSCOPY WITH PROPOFOL N/A 06/02/2021   Procedure: COLONOSCOPY WITH PROPOFOL;  Surgeon: Toney Reil, MD;  Location: [facility];  Service: Gastroenterology;  Laterality: N/A;  SPANISH INTERPRETER   LAPAROSCOPIC HYSTERECTOMY     LAPAROSCOPIC OOPHERECTOMY Right    SHOULDER ARTHROSCOPY WITH ROTATOR CUFF REPAIR AND SUBACROMIAL DECOMPRESSION Left 08/22/2021   Procedure: SHOULDER ARTHROSCOPY WITH ROTATOR CUFF REPAIR AND SUBACROMIAL DECOMPRESSION;  Surgeon: Jones Broom, MD;  Location: [facility];  Service: Orthopedics;  Laterality: Left;   Patient Active Problem List   Diagnosis Date Noted   Screen for colon cancer    Muscle cramps 05/28/2018   Pre-diabetes 05/28/2018   Fatty liver 05/28/2018   Hair loss 05/28/2018   Sciatica 11/01/2015   S/P right oophorectomy 11/01/2015   S/P hysterectomy 11/01/2015    PCP: No PCP   REFERRING PROVIDER: Dr. Berenda Morale   REFERRING DIAG: Left Shoulder Pain   THERAPY DIAG:  Chronic left shoulder pain  Stiffness of left  shoulder, not elsewhere classified  Rationale for Evaluation and Treatment: Rehabilitation  ONSET DATE: 07/17/2020   SUBJECTIVE:                                                                                                                                                                                      SUBJECTIVE STATEMENT: Pt reports that she was unable to reach out to Dr. Joice Lofts because she did have his number. She continues to have left shoulder pain but she was able  to do all the exercises.  Hand dominance: Right  PERTINENT HISTORY: Pt initially sustained a left shoulder injury while at work around July 2021. She had requested that a physician's note be given to her employeer to provide working restrictions, but her employeer did not respect her restrictions. As a result, she suffered a left supraspinatus tear. She then had a left shoulder supraspinatus repair on June of 2023 and she was having work comp pay but this ran out and she still had left shoulder deficits.     PAIN:  Are you having pain? Yes: NPRS scale: 3/10 Pain location: Left shoulder on lateral deltoid  Pain description: Achy  Aggravating factors: Moving the left shoulder in the wrong way-abduction and flexion  Relieving factors: Hold it by her side   PRECAUTIONS: None  RED FLAGS: None of the red flags are applicable  None   WEIGHT BEARING RESTRICTIONS: No  FALLS:  Has patient fallen in last 6 months? No  LIVING ENVIRONMENT: Lives with: lives with their daughter Lives in: House/apartment Stairs: Yes: External: 4-5 steps; on right going up Has following equipment at home: None  OCCUPATION: She is on disability, so not working now   PLOF: Independent  PATIENT GOALS: She wants to be able to use left shoulder again so she can return to work and to reduce left shoulder pain.   NEXT MD VISIT: September 2024 , Dr. Marisa Sprinkles is her PCP   OBJECTIVE:   VITALS: BP HR 69 SpO2 100  DIAGNOSTIC FINDINGS:   CLINICAL DATA:  Status post left shoulder surgery 08/2021. Left shoulder pain and weakness.   EXAM: MRI OF THE LEFT SHOULDER WITHOUT CONTRAST   TECHNIQUE: Multiplanar, multisequence MR imaging of the shoulder was performed. No intravenous contrast was administered.   COMPARISON:  MRI left shoulder 07/12/2021   FINDINGS: Rotator cuff: There are 2 new screw anchors within the anterior greater tuberosity. The more superior screw anchor fixates the anterior supraspinatus tendon footprint insertion where a high-grade partial-thickness tear was previously seen. There is moderate to high-grade intermediate T2 signal and enlargement of the anterior supraspinatus tendon likely chronic tendinosis. The prior fluid bright high-grade partial-thickness tear is no longer visualized. No significant tendon retraction. Moderate anterior infraspinatus intermediate T2 signal tendinosis. Resolution of the prior trace fluid bright signal at the anterior deep aspect of the infraspinatus musculotendinous junction. Mild superior subscapularis chronic tendinosis. The teres minor is intact.   Muscles: No rotator cuff muscle atrophy, fatty infiltration, or edema.   Biceps long head: The proximal long head of the biceps tendon again demonstrates moderate intermediate T2 signal tendinosis.   Acromioclavicular Joint: Likely partial distal clavicle excision and possible mild acromioplasty. Type 2 acromion. Mild-to-moderate fluid within the subacromial/subdeltoid bursa, similar to prior.   Glenohumeral Joint: Mild glenoid and humeral head cartilage thinning.   Labrum: Grossly intact, but evaluation is limited by lack of intraarticular fluid.   Bones:  No acute fracture.   Other: None.   IMPRESSION: Compared to 07/12/2021:   1. Interval anterior supraspinatus tendon repair. The prior high-grade partial-thickness tear is no longer seen. There is moderate to high-grade anterior supraspinatus chronic  tendinosis. 2. Moderate anterior infraspinatus and mild superior subscapularis chronic tendinosis. 3. Moderate proximal long head of the biceps tendinosis. 4. Likely interval partial distal clavicle excision and mild acromioplasty. 5. Mild-to-moderate subacromial/subdeltoid bursitis, similar to prior.     Electronically Signed   By: Neita Garnet M.D.   On: 03/20/2022 12:28    PATIENT SURVEYS:  FOTO 37/100 with target of 61   COGNITION: Overall cognitive status: Within functional limits for tasks assessed     SENSATION: WFL  POSTURE: No deficits   UPPER EXTREMITY ROM:                           Active ROM Right eval Left eval  Shoulder flexion 180 90  Shoulder extension    Shoulder abduction 180 90  Shoulder adduction    Shoulder internal rotation    Shoulder external rotation    Elbow flexion    Elbow extension    Wrist flexion    Wrist extension    Wrist ulnar deviation    Wrist radial deviation    Wrist pronation    Wrist supination    (Blank rows = not tested)                                                         UPPER EXTREMITY MMT:  MMT Right eval Left eval  Shoulder flexion 4 3+  Shoulder extension    Shoulder abduction    Shoulder adduction 4 3+  Shoulder internal rotation 4 4-  Shoulder external rotation 4 4  Middle trapezius    Lower trapezius    Elbow flexion    Elbow extension    Wrist flexion    Wrist extension    Wrist ulnar deviation    Wrist radial deviation    Wrist pronation    Wrist supination    Grip strength (lbs)    (Blank rows = not tested)   JOINT MOBILITY TESTING:  Deferred until next session   PALPATION:  Anterior and lateral portion of left shoulder TTP    TODAY'S TREATMENT:                                                                                                                                         DATE:   04/25/23: All single UE exercises performed on LUE  Shoulder Flex/ Ext AAROM  Pulleys 3 x 10  Shoulder Abd/Add AAROM Pulleys 3 x 10  OMEGA Seated Rows #15 1 x 10  -Pt unable to continue performing due to pain  Seated Shoulder retraction 1 x 10 -Pt unable to continue performing due to pain  Seated Shoulder Flexion Isometric with 3 sec hold 1 x 5  Seated Shoulder Abduction Isometric with 3 sec hold 1 x 5  Supine Shoulder Flexion AAROM 1 x 10  Supine Shoulder Abduction AROM 1 x 10  -Pt feels increased pain at and above 90 degrees   04/19/23: All single UE exercises performed on LUE  Shoulder Flex/ Ext AAROM Pulleys 3 x 10  Shoulder Abd/Add AAROM Pulleys 3 x 10  Left Upper Trap Stretch 2 x 30 sec  Shoulder Flexion Isometric 1 x 10 with 5 sec hold  Shoulder Abduction Isometric 1 x 10 with 5 sec hold    PATIENT EDUCATION: Education details: form and technique for correct performance of exercise  Person educated: Patient Education method: Explanation, Demonstration, Verbal cues, and Handouts Education comprehension: verbalized understanding, returned demonstration, and verbal cues required  HOME EXERCISE PROGRAM: Access Code: [email or web address] URL: [email or web address] Date: 04/19/2023 Prepared by: Ellin Goodie  Exercises - Seated Upper Trapezius Stretch  - 1 x daily - 3 reps - 30-60 sec  hold - Seated Shoulder Flexion AAROM with Pulley Behind  - 1 x daily - 3 sets - 10 reps - Seated Shoulder Abduction AAROM with Pulley Behind  - 1 x daily - 3 sets - 10 reps - Isometric Shoulder Flexion at Wall  - 1 x weekly - 2 sets - 10 reps - 5 sec hold hold - Isometric Shoulder Abduction at Wall  - 1 x daily - 2 sets - 10 reps - 5 sec  hold  ASSESSMENT:  CLINICAL IMPRESSION:           Pt presents for f/u for initial treatment after left shoulder pain in setting of left supraspinatus tendinosis. She continues to be significantly limited in PT session by pain in during isometric exercises. PT recommends returning to orthopedist for further evaluation and treatment  of underlying left shoulder pain. PT provided pt with Dr. Binnie Rail number to schedule apt and will route note to PCP. Pt to remove apts from schedule and she will reschedule once she gets consult from orthopedist.    OBJECTIVE IMPAIRMENTS: decreased ROM, decreased strength, impaired UE functional use, and pain.   ACTIVITY LIMITATIONS: carrying, lifting, dressing, reach over head, and hygiene/grooming  PARTICIPATION LIMITATIONS: meal prep, cleaning, and laundry  PERSONAL FACTORS: Age, Past/current experiences, and Time since onset of injury/illness/exacerbation are also affecting patient's functional outcome.   REHAB POTENTIAL: Fair need ortho consult for better idea of prognosis   CLINICAL DECISION MAKING: Stable/uncomplicated  EVALUATION COMPLEXITY: Moderate   GOALS: Goals reviewed with patient? No  SHORT TERM GOALS: Target date: 05/03/2023  Pt will be independent with HEP in order to improve strength and balance in order to decrease fall risk and improve function at home and work. Baseline: Could not perform on her own. 04/25/23: Performing independently  Goal status: Achieved    LONG TERM GOALS: Target date: 06/28/2023  Patient will have improved function and activity level as evidenced by an increase in FOTO score by 10 points or more.  Baseline: 37/100 with target of 61 Goal status: Ongoing   2.  Pt will demonstrate full active L shoulder motion with 180 deg abduction and flexion in order to complete self care ADLS.  Baseline: Shoulder Flex L 90, Shoulder Abd 90   Goal status: Ongoing   3.  Patient will demonstrate gross L shoulder MMTs of 4+/5 in order to be able to complete heavy household tasks.  Baseline: Shoulder Flex R/L 4/3+, Shoulder Abd R/L 4/3+  Goal status: Ongoing     PLAN:  PT FREQUENCY: 1-2x/week  PT DURATION: 10 weeks  PLANNED INTERVENTIONS: Therapeutic exercises, Therapeutic activity, Neuromuscular re-education, Patient/Family education, Self Care,  Joint mobilization, Joint manipulation, Dry Needling, Electrical stimulation, Spinal manipulation, Spinal mobilization, Cryotherapy, Moist heat, Manual therapy, and Re-evaluation  PLAN FOR NEXT SESSION: Joint mobility assessment, continue to progress ROM and strength exercises. Assess parascapular  strength. OMEGA Seated Rows   Ellin Goodie PT, DPT  [facility] [address], [place], [address] Phone: [phone]   Fax:  [phone]

## 2023-04-26 ENCOUNTER — Ambulatory Visit: Payer: Medicaid Other | Admitting: Physical Therapy

## 2023-05-07 ENCOUNTER — Encounter: Payer: Medicaid Other | Admitting: Physical Therapy

## 2023-05-09 ENCOUNTER — Encounter: Payer: Medicaid Other | Admitting: Physical Therapy

## 2023-05-14 ENCOUNTER — Encounter: Payer: Medicaid Other | Admitting: Physical Therapy

## 2023-05-16 ENCOUNTER — Encounter: Payer: Medicaid Other | Admitting: Physical Therapy

## 2023-05-21 ENCOUNTER — Encounter: Payer: Medicaid Other | Admitting: Physical Therapy

## 2023-05-23 ENCOUNTER — Encounter: Payer: Medicaid Other | Admitting: Physical Therapy

## 2023-05-24 ENCOUNTER — Encounter: Payer: Medicaid Other | Admitting: Physical Therapy

## 2023-05-28 ENCOUNTER — Encounter: Payer: Medicaid Other | Admitting: Physical Therapy

## 2023-05-29 ENCOUNTER — Encounter: Payer: Medicaid Other | Admitting: Physical Therapy

## 2023-05-31 ENCOUNTER — Encounter: Payer: Medicaid Other | Admitting: Physical Therapy

## 2023-07-02 ENCOUNTER — Ambulatory Visit: Payer: Medicaid Other | Attending: Orthopedic Surgery

## 2023-07-02 DIAGNOSIS — G8929 Other chronic pain: Secondary | ICD-10-CM | POA: Insufficient documentation

## 2023-07-02 DIAGNOSIS — M25512 Pain in left shoulder: Secondary | ICD-10-CM | POA: Insufficient documentation

## 2023-07-02 DIAGNOSIS — M6281 Muscle weakness (generalized): Secondary | ICD-10-CM | POA: Insufficient documentation

## 2023-07-02 DIAGNOSIS — M25612 Stiffness of left shoulder, not elsewhere classified: Secondary | ICD-10-CM | POA: Insufficient documentation

## 2023-07-02 NOTE — Therapy (Cosign Needed Addendum)
OUTPATIENT PHYSICAL THERAPY SHOULDER EVALUATION   Patient Name: Carolyn Sampson MRN: [id number] DOB:1961-09-22, 62 y.o., female Today's Date: 07/02/2023  END OF SESSION:  PT End of Session - 07/02/23 1637     Visit Number 1    Number of Visits 17    Date for PT Re-Evaluation 08/27/23    PT Start Time 1517    PT Stop Time 1558    PT Time Calculation (min) 41 min    Activity Tolerance Patient limited by pain    Behavior During Therapy [facility] for tasks assessed/performed             Past Medical History:  Diagnosis Date   Fatty liver    Pre-diabetes    Past Surgical History:  Procedure Laterality Date   ABDOMINAL HYSTERECTOMY     APPENDECTOMY     CESAREAN SECTION     CESAREAN SECTION     COLONOSCOPY WITH PROPOFOL N/A 06/02/2021   Procedure: COLONOSCOPY WITH PROPOFOL;  Surgeon: Toney Reil, MD;  Location: [facility];  Service: Gastroenterology;  Laterality: N/A;  SPANISH INTERPRETER   LAPAROSCOPIC HYSTERECTOMY     LAPAROSCOPIC OOPHERECTOMY Right    SHOULDER ARTHROSCOPY WITH ROTATOR CUFF REPAIR AND SUBACROMIAL DECOMPRESSION Left 08/22/2021   Procedure: SHOULDER ARTHROSCOPY WITH ROTATOR CUFF REPAIR AND SUBACROMIAL DECOMPRESSION;  Surgeon: Jones Broom, MD;  Location: [facility];  Service: Orthopedics;  Laterality: Left;   Patient Active Problem List   Diagnosis Date Noted   Screen for colon cancer    Muscle cramps 05/28/2018   Pre-diabetes 05/28/2018   Fatty liver 05/28/2018   Hair loss 05/28/2018   Sciatica 11/01/2015   S/P right oophorectomy 11/01/2015   S/P hysterectomy 11/01/2015    PCP: N/A  REFERRING PROVIDER: Jones Broom, MD  REFERRING DIAG:  M25.519 (ICD-10-CM) - Pain in unspecified shoulder    THERAPY DIAG:  Chronic left shoulder pain  Stiffness of left shoulder, not elsewhere classified  Muscle weakness (generalized)  Acute pain of left shoulder  Rationale for Evaluation and Treatment: Rehabilitation  ONSET  DATE: 07/17/2020  SUBJECTIVE:                                                                                                                                                                                      SUBJECTIVE STATEMENT: Pt is a pleasant 62 y.o F presenting to PT with bilateral shoulder pain LUE> RUE.  Hand dominance: Right  PERTINENT HISTORY: Pt is a 62 y/o F w/ chronic bilat shoulder pain LUE> RUE. Pt s/p L shoulder arthroscopy w/ RTC repair and subacromial decompression in November, 2022. Pt's has had ongoing pain since this surgery and  has had multiple bouts of physical therapy. Her previous physical therapy being in July, 2024 was d/c to have further orthopedic specialist treatment of the L shoulder. Pt received a cortisone injection in her L shoulder which decreased her pain mildly, and received a new referral to continue physical therapy. Pt reports increased R shoulder pain 2/2 to overuse to compensate from L shoulder pain/ weakness. Pt reports difficulty with carrying objects and lifting with her left arm. Pt's sx are relieved by ceasing movement, icing the L shoulder and mild relief from corticosteroids injections. Pain at best is 4-5/10 and pain at worst is a 7/10. Pt notes mild N/T traveling down the L arm. Along with, difficulty sleeping on her left side, increasing her pain and must have a pillow under her arm.   PAIN:  Are you having pain? Yes: NPRS scale: 7/10 Pain location: L lateral and anterior shoulder  Pain description: Sharp, stabbing Aggravating factors: Holding something, cooking, lifting arm up  Relieving factors: Steroid injection, ice 4-5/10 NPRS pain at best  PRECAUTIONS: None  RED FLAGS: None   WEIGHT BEARING RESTRICTIONS: No  FALLS:  Has patient fallen in last 6 months? No  OCCUPATION: Unemployed 2/2 L shoulder pain   PLOF: Independent  PATIENT GOALS: To reduce her pain.   NEXT MD VISIT:   OBJECTIVE:  Note: Objective measures were  completed at Evaluation unless otherwise noted.  DIAGNOSTIC FINDINGS:  03/18/2022- IMPRESSION:  Status post left shoulder surgery 08/2021. Left shoulder pain and weakness. Compared to 07/12/2021:   1. Interval anterior supraspinatus tendon repair. The prior high-grade partial-thickness tear is no longer seen. There is moderate to high-grade anterior supraspinatus chronic tendinosis. 2. Moderate anterior infraspinatus and mild superior subscapularis chronic tendinosis. 3. Moderate proximal long head of the biceps tendinosis. 4. Likely interval partial distal clavicle excision and mild acromioplasty. 5. Mild-to-moderate subacromial/subdeltoid bursitis, similar to prior.  PATIENT SURVEYS:  FOTO Deferred to next session    COGNITION: Overall cognitive status: Within functional limits for tasks assessed     SENSATION: UQNS: mild decrease in sensation at C2-C3 and C4-C5 dermatomal pattern   POSTURE: Rounded shoulders noted  UPPER EXTREMITY ROM:  Cervical AROM:       Lateral flexion L- 35deg R-45 deg*     Rotation L 50 deg R 60 deg*    Extension 20 deg*    Flexion 50 deg   Active ROM Right eval Left eval  Shoulder flexion WFL 105*  Shoulder extension    Shoulder abduction WFL* 91*  Shoulder adduction    Shoulder internal rotation L1 L buttock/65 deg*  Shoulder external rotation T7* C2/35 deg*  Elbow flexion    Elbow extension    Wrist flexion    Wrist extension    Wrist ulnar deviation    Wrist radial deviation    Wrist pronation    Wrist supination    (Blank rows = not tested)  PROM: L IR: 75 deg* L ER: 55 deg*   UPPER EXTREMITY MMT:  MMT Right eval Left eval  Shoulder flexion 3+* 2-*  Shoulder extension    Shoulder abduction 3* 2-*  Shoulder adduction    Shoulder internal rotation 4- 2-*  Shoulder external rotation 4- 3+*  Middle trapezius    Lower trapezius    Elbow flexion    Elbow extension    Wrist flexion    Wrist extension    Wrist ulnar  deviation    Wrist radial deviation    Wrist pronation  Wrist supination    Grip strength (lbs)    (Blank rows = not tested)  SHOULDER SPECIAL TESTS: Not performed 2/2 to increased irritability of pt's sx.   JOINT MOBILITY TESTING:  Not performed 2/2 to increased irritability of pt's sx.    PALPATION:  Anterior and lateral portion of left shoulder TTP    TODAY'S TREATMENT: DATE:    PATIENT EDUCATION: Education details: HEP given to pt Person educated: Patient Education method: Medical illustrator Education comprehension: verbalized understanding and returned demonstration  HOME EXERCISE PROGRAM: (Josh to input)   ASSESSMENT:  CLINICAL IMPRESSION: Patient is a 62 y.o. F who was seen today for physical therapy evaluation and treatment for chronic bilat shoulder pain LUE> RUE. Time constraint noted throughout evaluation 2/2 to requiring an interpreter. Pt has been experiencing shoulder pain since 2022 following her L shoulder arthroscopy w/ RTC repair and subacromial decompression surgery. Pt presents with 7/10 NPS, with sx increasing with carrying and lifting activities. Upon examination, AROM deficits were noted with the most limited ROM deficit being in L shoulder ER and abduction w/ concordant pain along, w/ limited ROM and pain w/ PROM. Pt also notes strength deficits in L shoulder ER, IR, flexion, and abduction displayed with MMT and decreased ability to perform functional movements 2/2 weakness. Pt reports mild sx of N/T down LUE, in certain positions, however a Spurling's test could not be performed 2/2 to limited cervical ROM.  These symptoms are limiting pt's ADL's. Pt would benefit from skilled PT to address strength and pain deficits to improve QoL and return to PLOF.   OBJECTIVE IMPAIRMENTS: decreased mobility, decreased ROM, decreased strength, hypomobility, impaired flexibility, impaired UE functional use, and pain.   ACTIVITY LIMITATIONS: carrying, lifting,  sleeping, and bed mobility  PARTICIPATION LIMITATIONS: cleaning, laundry, shopping, and community activity  PERSONAL FACTORS: Age, Past/current experiences, and Time since onset of injury/illness/exacerbation are also affecting patient's functional outcome.   REHAB POTENTIAL: Fair chronicity of L shoulder pain  CLINICAL DECISION MAKING: Evolving/moderate complexity  EVALUATION COMPLEXITY: Moderate   GOALS: Goals reviewed with patient? Yes  SHORT TERM GOALS: Target date: 07/30/2023  Pt will be independent with HEP to improve L shoulder strength and decrease pain with functional activities   Baseline:07/02/23: HEP given at today's session.  Goal status: INITIAL  LONG TERM GOALS: Target date: 08/27/2023  Pt will improve FOTO to target score to demonstrate clinically significant improvement in functional mobility.  Baseline: 07/02/23: Deferred to next session Goal status: INITIAL  2.  Pt will decrease pain to <5/10 to demonstrate clinically significant improvement in pain levels with functional activity and ADL's.  Baseline: 07/02/23: 7/10 NPRS Goal status: INITIAL  3.  Pt will improve all L shoulder AROM to WFL/ to match pt's R shoulder to improve functional mobility of the LUE.  Baseline: 07/02/23:  Active ROM Right eval Left eval  Shoulder flexion WFL 105*  Shoulder extension    Shoulder abduction WFL* 91*  Shoulder adduction    Shoulder internal rotation L1 L buttock/65 deg*  Shoulder external rotation T7* C2/35 deg*    Goal status: INITIAL  4.   Pt will self report the ability to sleep on her left arm positioned to pt comfort, w/o increased pain to demonstrate improvements with ability to improve sleep quality.  Baseline: 07/02/23: Unable to sleep on left arm w/o a pillow underneath.  Goal status: INITIAL    PLAN:  PT FREQUENCY: 2x/week  PT DURATION: 8 weeks  PLANNED INTERVENTIONS: Therapeutic exercises,  Therapeutic activity, Neuromuscular re-education, Balance  training, Gait training, Patient/Family education, Self Care, Joint mobilization, Joint manipulation, Spinal manipulation, Spinal mobilization, Cryotherapy, Moist heat, and Re-evaluation  PLAN FOR NEXT SESSION: FOTO, Assess cervical joint mobility more in depth, L shoulder PAM's, Add to HEP    Marney Doctor, Student-PT 07/02/2023, 5:17 PM

## 2023-07-03 NOTE — Addendum Note (Signed)
Addended by: Phineas Real on: 07/03/2023 09:14 AM   Modules accepted: Orders

## 2023-07-09 ENCOUNTER — Ambulatory Visit: Payer: Medicaid Other | Attending: Orthopedic Surgery

## 2023-07-09 DIAGNOSIS — M25612 Stiffness of left shoulder, not elsewhere classified: Secondary | ICD-10-CM | POA: Diagnosis present

## 2023-07-09 DIAGNOSIS — M6281 Muscle weakness (generalized): Secondary | ICD-10-CM | POA: Insufficient documentation

## 2023-07-09 DIAGNOSIS — M25512 Pain in left shoulder: Secondary | ICD-10-CM | POA: Diagnosis present

## 2023-07-09 DIAGNOSIS — G8929 Other chronic pain: Secondary | ICD-10-CM | POA: Insufficient documentation

## 2023-07-09 NOTE — Therapy (Addendum)
OUTPATIENT PHYSICAL THERAPY SHOULDER TREATMENT    Patient Name: Carolyn Sampson MRN: [id number] DOB:1960-11-07, 62 y.o., female Today's Date: 07/09/2023  END OF SESSION:  PT End of Session - 07/09/23 1117     Visit Number 2    Number of Visits 17    Date for PT Re-Evaluation 08/27/23    PT Start Time 1120    PT Stop Time 1200    PT Time Calculation (min) 40 min    Activity Tolerance Patient limited by pain    Behavior During Therapy [facility] for tasks assessed/performed             Past Medical History:  Diagnosis Date   Fatty liver    Pre-diabetes    Past Surgical History:  Procedure Laterality Date   ABDOMINAL HYSTERECTOMY     APPENDECTOMY     CESAREAN SECTION     CESAREAN SECTION     COLONOSCOPY WITH PROPOFOL N/A 06/02/2021   Procedure: COLONOSCOPY WITH PROPOFOL;  Surgeon: Toney Reil, MD;  Location: [facility];  Service: Gastroenterology;  Laterality: N/A;  SPANISH INTERPRETER   LAPAROSCOPIC HYSTERECTOMY     LAPAROSCOPIC OOPHERECTOMY Right    SHOULDER ARTHROSCOPY WITH ROTATOR CUFF REPAIR AND SUBACROMIAL DECOMPRESSION Left 08/22/2021   Procedure: SHOULDER ARTHROSCOPY WITH ROTATOR CUFF REPAIR AND SUBACROMIAL DECOMPRESSION;  Surgeon: Jones Broom, MD;  Location: [facility];  Service: Orthopedics;  Laterality: Left;   Patient Active Problem List   Diagnosis Date Noted   Screen for colon cancer    Muscle cramps 05/28/2018   Pre-diabetes 05/28/2018   Fatty liver 05/28/2018   Hair loss 05/28/2018   Sciatica 11/01/2015   S/P right oophorectomy 11/01/2015   S/P hysterectomy 11/01/2015    PCP: N/A  REFERRING PROVIDER: Jones Broom, MD  REFERRING DIAG:  M25.519 (ICD-10-CM) - Pain in unspecified shoulder    THERAPY DIAG:  Chronic left shoulder pain  Stiffness of left shoulder, not elsewhere classified  Muscle weakness (generalized)  Acute pain of left shoulder  Rationale for Evaluation and Treatment: Rehabilitation  ONSET  DATE: 07/17/2020  SUBJECTIVE:                                                                                                                                                                                      SUBJECTIVE STATEMENT: Pt states that the HEP have helped her neck mobility. Pt reports her pain at rest 3-4/10 and 9/10 w/ activity.  Hand dominance: Right  PERTINENT HISTORY: Pt is a 62 y/o F w/ chronic bilat shoulder pain LUE> RUE. Pt s/p L shoulder arthroscopy w/ RTC repair and subacromial decompression in November, 2022. Pt's has had  ongoing pain since this surgery and has had multiple bouts of physical therapy. Her previous physical therapy being in July, 2024 was d/c to have further orthopedic specialist treatment of the L shoulder. Pt received a cortisone injection in her L shoulder which decreased her pain mildly, and received a new referral to continue physical therapy. Pt reports increased R shoulder pain 2/2 to overuse to compensate from L shoulder pain/ weakness. Pt reports difficulty with carrying objects and lifting with her left arm. Pt's sx are relieved by ceasing movement, icing the L shoulder and mild relief from corticosteroids injections. Pain at best is 4-5/10 and pain at worst is a 7/10. Pt notes mild N/T traveling down the L arm. Along with, difficulty sleeping on her left side, increasing her pain and must have a pillow under her arm.   PAIN:  Are you having pain? Yes: NPRS scale: 3-4/10 Pain location: L lateral and anterior shoulder  Pain description: Sharp, stabbing Aggravating factors: Holding something, cooking, lifting arm up  Relieving factors: Steroid injection, ice 4-5/10 NPRS pain at best  PRECAUTIONS: None  RED FLAGS: None   WEIGHT BEARING RESTRICTIONS: No  FALLS:  Has patient fallen in last 6 months? No  OCCUPATION: Unemployed 2/2 L shoulder pain   PLOF: Independent  PATIENT GOALS: To reduce her pain.   NEXT MD VISIT:   OBJECTIVE:   Note: Objective measures were completed at Evaluation unless otherwise noted.  DIAGNOSTIC FINDINGS:  03/18/2022- IMPRESSION:  Status post left shoulder surgery 08/2021. Left shoulder pain and weakness. Compared to 07/12/2021:   1. Interval anterior supraspinatus tendon repair. The prior high-grade partial-thickness tear is no longer seen. There is moderate to high-grade anterior supraspinatus chronic tendinosis. 2. Moderate anterior infraspinatus and mild superior subscapularis chronic tendinosis. 3. Moderate proximal long head of the biceps tendinosis. 4. Likely interval partial distal clavicle excision and mild acromioplasty. 5. Mild-to-moderate subacromial/subdeltoid bursitis, similar to prior.  PATIENT SURVEYS:  FOTO 38/54    COGNITION: Overall cognitive status: Within functional limits for tasks assessed     SENSATION: UQNS: mild decrease in sensation at C2-C3 and C4-C5 dermatomal pattern   POSTURE: Rounded shoulders noted  UPPER EXTREMITY ROM:  Cervical AROM:       Lateral flexion L- 35deg R-45 deg*     Rotation L 50 deg R 60 deg*    Extension 20 deg*    Flexion 50 deg   Active ROM Right eval Left eval  Shoulder flexion WFL 105*  Shoulder extension    Shoulder abduction WFL* 91*  Shoulder adduction    Shoulder internal rotation L1 L buttock/65 deg*  Shoulder external rotation T7* C2/35 deg*  Elbow flexion    Elbow extension    Wrist flexion    Wrist extension    Wrist ulnar deviation    Wrist radial deviation    Wrist pronation    Wrist supination    (Blank rows = not tested)  PROM: L IR: 75 deg* L ER: 55 deg*  *denotes pain with movement  PROM: R Shoulder- Flexion and Abduction WFL w/ pain at end range PROM: L Shoulder- Flexion and Abduction limited to 90 degrees w/ pain throughout the motion and pain limiting further assessment.   UPPER EXTREMITY MMT:  MMT Right eval Left eval  Shoulder flexion 3+* 2-*  Shoulder extension    Shoulder  abduction 3* 2-*  Shoulder adduction    Shoulder internal rotation 4- 2-*  Shoulder external rotation 4- 3+*  Middle trapezius  Lower trapezius    Elbow flexion    Elbow extension    Wrist flexion    Wrist extension    Wrist ulnar deviation    Wrist radial deviation    Wrist pronation    Wrist supination    Grip strength (lbs)    (Blank rows = not tested)  *denotes pain with movement  SHOULDER SPECIAL TESTS: Not performed 2/2 to increased irritability of pt's sx.   JOINT MOBILITY TESTING:   No significant hypomobility noted between R shoulder and L shoulder joint. L shoulder noting increased pain with muscle guarding. AP/PA and inferior bilaterally  PALPATION:  Anterior and lateral portion of left shoulder TTP    TODAY'S TREATMENT: DATE: 07/09/23  Session focused on further evaluation of pt's L shoulder pain including joint mobility testing (see above), bilat UE PROM assessment, pt education on current condition and completion of FOTO assessment.    PATIENT EDUCATION: Education details: HEP given to pt Person educated: Patient Education method: Medical illustrator Education comprehension: verbalized understanding and returned demonstration  HOME EXERCISE PROGRAM:   Access Code: [email or web address] URL: [email or web address] Date: 07/03/2023 Prepared by: Tomasa Hose  Exercises - Seated Upper Trapezius Stretch  - 1 x daily - 5-7 x weekly - 2 sets - 3 reps - 20 hold - Cervical Extension Stretch  - 1 x daily - 5-7 x weekly - 1 sets - 10 reps - 5 hold   ASSESSMENT:  CLINICAL IMPRESSION: Session focused on further assessment of L shoulder pain. Pt requiring interpreter increasing time spent during session. Pt notes no significant hypomobility in the L shoulder compared to the R shoulder displayed w/ bilat shoulder PAM's. Pt however does note increased pain and muscle guarding in the L shoulder. Pt educated on etiology of current condition and goals of  physical therapy to assist in increasing L shoulder strength and ROM and to decrease her L shoulder pain. PROM assessment noting increased L shoulder pain above 90 degrees of L shoulder flexion and abduction. These symptoms are limiting pt's ADL's and pt would benefit from skilled PT to address strength and pain deficits to improve QoL and return to PLOF.    OBJECTIVE IMPAIRMENTS: decreased mobility, decreased ROM, decreased strength, hypomobility, impaired flexibility, impaired UE functional use, and pain.   ACTIVITY LIMITATIONS: carrying, lifting, sleeping, and bed mobility  PARTICIPATION LIMITATIONS: cleaning, laundry, shopping, and community activity  PERSONAL FACTORS: Age, Past/current experiences, and Time since onset of injury/illness/exacerbation are also affecting patient's functional outcome.   REHAB POTENTIAL: Fair chronicity of L shoulder pain  CLINICAL DECISION MAKING: Evolving/moderate complexity  EVALUATION COMPLEXITY: Moderate   GOALS: Goals reviewed with patient? Yes  SHORT TERM GOALS: Target date: 07/30/2023  Pt will be independent with HEP to improve L shoulder strength and decrease pain with functional activities   Baseline:07/02/23: HEP given at today's session.  Goal status: INITIAL  LONG TERM GOALS: Target date: 08/27/2023  Pt will improve FOTO to target score to demonstrate clinically significant improvement in functional mobility.  Baseline: 07/02/23: Deferred to next session 07/09/23: 38/54 Goal status: INITIAL  2.  Pt will decrease pain to <5/10 to demonstrate clinically significant improvement in pain levels with functional activity and ADL's.  Baseline: 07/02/23: 7/10 NPRS Goal status: INITIAL  3.  Pt will improve all L shoulder AROM to WFL/ to match pt's R shoulder to improve functional mobility of the LUE.  Baseline: 07/02/23:  Active ROM Right eval Left eval  Shoulder flexion WFL 105*  Shoulder extension  Shoulder abduction WFL* 91*  Shoulder  adduction    Shoulder internal rotation L1 L buttock/65 deg*  Shoulder external rotation T7* C2/35 deg*    Goal status: INITIAL  4.   Pt will self report the ability to sleep on her left arm positioned to pt comfort, w/o increased pain to demonstrate improvements with ability to improve sleep quality.  Baseline: 07/02/23: Unable to sleep on left arm w/o a pillow underneath.  Goal status: INITIAL    PLAN:  PT FREQUENCY: 2x/week  PT DURATION: 8 weeks  PLANNED INTERVENTIONS: Therapeutic exercises, Therapeutic activity, Neuromuscular re-education, Balance training, Gait training, Patient/Family education, Self Care, Joint mobilization, Joint manipulation, Spinal manipulation, Spinal mobilization, Cryotherapy, Moist heat, and Re-evaluation  PLAN FOR NEXT SESSION: Assess cervical joint mobility more in depth, Add exercises to HEP.      Marney Doctor, Student-PT  Delphia Grates. Fairly IV, PT, DPT Physical Therapist- [facility]  07/09/23, 1:42 PM

## 2023-07-11 ENCOUNTER — Ambulatory Visit: Payer: Medicaid Other

## 2023-07-11 DIAGNOSIS — M25512 Pain in left shoulder: Secondary | ICD-10-CM | POA: Diagnosis not present

## 2023-07-11 DIAGNOSIS — M6281 Muscle weakness (generalized): Secondary | ICD-10-CM

## 2023-07-11 DIAGNOSIS — M25612 Stiffness of left shoulder, not elsewhere classified: Secondary | ICD-10-CM

## 2023-07-11 DIAGNOSIS — G8929 Other chronic pain: Secondary | ICD-10-CM

## 2023-07-11 NOTE — Therapy (Addendum)
OUTPATIENT PHYSICAL THERAPY SHOULDER TREATMENT    Patient Name: Carolyn Sampson MRN: [id number] DOB:1960-12-22, 62 y.o., female Today's Date: 07/11/2023  END OF SESSION:  PT End of Session - 07/11/23 1025     Visit Number 3    Number of Visits 17    Date for PT Re-Evaluation 08/27/23    PT Start Time 1030    PT Stop Time 1108    PT Time Calculation (min) 38 min    Activity Tolerance Patient limited by pain    Behavior During Therapy WFL for tasks assessed/performed             Past Medical History:  Diagnosis Date   Fatty liver    Pre-diabetes    Past Surgical History:  Procedure Laterality Date   ABDOMINAL HYSTERECTOMY     APPENDECTOMY     CESAREAN SECTION     CESAREAN SECTION     COLONOSCOPY WITH PROPOFOL N/A 06/02/2021   Procedure: COLONOSCOPY WITH PROPOFOL;  Surgeon: Toney Reil, MD;  Location: [facility];  Service: Gastroenterology;  Laterality: N/A;  SPANISH INTERPRETER   LAPAROSCOPIC HYSTERECTOMY     LAPAROSCOPIC OOPHERECTOMY Right    SHOULDER ARTHROSCOPY WITH ROTATOR CUFF REPAIR AND SUBACROMIAL DECOMPRESSION Left 08/22/2021   Procedure: SHOULDER ARTHROSCOPY WITH ROTATOR CUFF REPAIR AND SUBACROMIAL DECOMPRESSION;  Surgeon: Jones Broom, MD;  Location: [facility];  Service: Orthopedics;  Laterality: Left;   Patient Active Problem List   Diagnosis Date Noted   Screen for colon cancer    Muscle cramps 05/28/2018   Pre-diabetes 05/28/2018   Fatty liver 05/28/2018   Hair loss 05/28/2018   Sciatica 11/01/2015   S/P right oophorectomy 11/01/2015   S/P hysterectomy 11/01/2015    PCP: N/A  REFERRING PROVIDER: Jones Broom, MD  REFERRING DIAG:  M25.519 (ICD-10-CM) - Pain in unspecified shoulder    THERAPY DIAG:  Chronic left shoulder pain  Stiffness of left shoulder, not elsewhere classified  Muscle weakness (generalized)  Acute pain of left shoulder  Rationale for Evaluation and Treatment: Rehabilitation  ONSET  DATE: 07/17/2020  SUBJECTIVE:                                                                                                                                                                                      SUBJECTIVE STATEMENT: Pt states that the pain in R shoulder is a 7/10NPS today. No notable changes since last session.  Hand dominance: Right  PERTINENT HISTORY: Pt is a 62 y/o F w/ chronic bilat shoulder pain LUE> RUE. Pt s/p L shoulder arthroscopy w/ RTC repair and subacromial decompression in November, 2022. Pt's has had ongoing pain since  this surgery and has had multiple bouts of physical therapy. Her previous physical therapy being in July, 2024 was d/c to have further orthopedic specialist treatment of the L shoulder. Pt received a cortisone injection in her L shoulder which decreased her pain mildly, and received a new referral to continue physical therapy. Pt reports increased R shoulder pain 2/2 to overuse to compensate from L shoulder pain/ weakness. Pt reports difficulty with carrying objects and lifting with her left arm. Pt's sx are relieved by ceasing movement, icing the L shoulder and mild relief from corticosteroids injections. Pain at best is 4-5/10 and pain at worst is a 7/10. Pt notes mild N/T traveling down the L arm. Along with, difficulty sleeping on her left side, increasing her pain and must have a pillow under her arm.   PAIN:  Are you having pain? Yes: NPRS scale: 7/10 Pain location: L lateral and anterior shoulder  Pain description: Sharp, stabbing Aggravating factors: Holding something, cooking, lifting arm up  Relieving factors: Steroid injection, ice 4-5/10 NPRS pain at best  PRECAUTIONS: None  RED FLAGS: None   WEIGHT BEARING RESTRICTIONS: No  FALLS:  Has patient fallen in last 6 months? No  OCCUPATION: Unemployed 2/2 L shoulder pain   PLOF: Independent  PATIENT GOALS: To reduce her pain.   NEXT MD VISIT:   OBJECTIVE:  Note: Objective  measures were completed at Evaluation unless otherwise noted.  DIAGNOSTIC FINDINGS:  03/18/2022- IMPRESSION:  Status post left shoulder surgery 08/2021. Left shoulder pain and weakness. Compared to 07/12/2021:   1. Interval anterior supraspinatus tendon repair. The prior high-grade partial-thickness tear is no longer seen. There is moderate to high-grade anterior supraspinatus chronic tendinosis. 2. Moderate anterior infraspinatus and mild superior subscapularis chronic tendinosis. 3. Moderate proximal long head of the biceps tendinosis. 4. Likely interval partial distal clavicle excision and mild acromioplasty. 5. Mild-to-moderate subacromial/subdeltoid bursitis, similar to prior.  PATIENT SURVEYS:  FOTO 38/54    COGNITION: Overall cognitive status: Within functional limits for tasks assessed     SENSATION: UQNS: mild decrease in sensation at C2-C3 and C4-C5 dermatomal pattern   POSTURE: Rounded shoulders noted  UPPER EXTREMITY ROM:  Cervical AROM:       Lateral flexion L- 35deg R-45 deg*     Rotation L 50 deg R 60 deg*    Extension 20 deg*    Flexion 50 deg   Active ROM Right eval Left eval  Shoulder flexion WFL 105*  Shoulder extension    Shoulder abduction WFL* 91*  Shoulder adduction    Shoulder internal rotation L1 L buttock/65 deg*  Shoulder external rotation T7* C2/35 deg*  Elbow flexion    Elbow extension    Wrist flexion    Wrist extension    Wrist ulnar deviation    Wrist radial deviation    Wrist pronation    Wrist supination    (Blank rows = not tested)  PROM: L IR: 75 deg* L ER: 55 deg*  *denotes pain with movement  PROM: R Shoulder- Flexion and Abduction WFL w/ pain at end range PROM: L Shoulder- Flexion and Abduction limited to 90 degrees w/ pain throughout the motion and pain limiting further assessment.   UPPER EXTREMITY MMT:  MMT Right eval Left eval  Shoulder flexion 3+* 2-*  Shoulder extension    Shoulder abduction 3* 2-*   Shoulder adduction    Shoulder internal rotation 4- 2-*  Shoulder external rotation 4- 3+*  Middle trapezius    Lower trapezius  Elbow flexion    Elbow extension    Wrist flexion    Wrist extension    Wrist ulnar deviation    Wrist radial deviation    Wrist pronation    Wrist supination    Grip strength (lbs)    (Blank rows = not tested)  *denotes pain with movement  SHOULDER SPECIAL TESTS: Not performed 2/2 to increased irritability of pt's sx.   JOINT MOBILITY TESTING:   No significant hypomobility noted between R shoulder and L shoulder joint. L shoulder noting increased pain with muscle guarding. AP/PA and inferior bilaterally  PALPATION:  Anterior and lateral portion of left shoulder TTP    TODAY'S TREATMENT: DATE: 07/11/23   Supine bilat shoulder flexion w/ dowel 2 x10 Supine L shoulder ER w/ dowel 2 x10  Seated L Upper Trap stretch to R side 2 x30 sec Seated cervical extension w/ pillow case behind neck 5 x10 sec Standing abduction slides at rail w/ pillowcase 2 x8  Standing ER isometrics 5 x 10sec hold (d/c 2/2 pt's tolerance)    PATIENT EDUCATION: Education details: HEP given to pt Person educated: Patient Education method: Medical illustrator Education comprehension: verbalized understanding and returned demonstration  HOME EXERCISE PROGRAM:   Access Code: [email or web address] URL: [email or web address] Date: 07/11/2023 Prepared by: Ronnie Derby  Exercises - Seated Upper Trapezius Stretch  - 1 x daily - 5-7 x weekly - 2 sets - 3 reps - 20 hold - Cervical Extension Stretch  - 1 x daily - 5-7 x weekly - 1 sets - 10 reps - 5 hold - Supine Shoulder Flexion Extension AAROM with Dowel  - 1 x daily - 5-7 x weekly - 2 sets - 12 reps - Supine Shoulder External Rotation with Dowel  - 1 x daily - 5-7 x weekly - 2 sets - 12 reps - Seated Shoulder Abduction Towel Slide at Table Top  - 1 x daily - 5-7 x weekly - 3 sets - 6  reps   ASSESSMENT:  CLINICAL IMPRESSION: Session focused on reviewing and updating pt's HEP. Pt notes improved tolerance to performing L shoulder AAROM and strengthening exercises. Pt displayed only a mild increase in pain with L shoulder ER and flexion based exercises however, verbalized and demonstrated understanding of her HEP and future sessions will continue to progress these exercises. Pt would benefit from skilled PT to address strength and pain deficits to improve QoL and return to PLOF.  OBJECTIVE IMPAIRMENTS: decreased mobility, decreased ROM, decreased strength, hypomobility, impaired flexibility, impaired UE functional use, and pain.   ACTIVITY LIMITATIONS: carrying, lifting, sleeping, and bed mobility  PARTICIPATION LIMITATIONS: cleaning, laundry, shopping, and community activity  PERSONAL FACTORS: Age, Past/current experiences, and Time since onset of injury/illness/exacerbation are also affecting patient's functional outcome.   REHAB POTENTIAL: Fair chronicity of L shoulder pain  CLINICAL DECISION MAKING: Evolving/moderate complexity  EVALUATION COMPLEXITY: Moderate   GOALS: Goals reviewed with patient? Yes  SHORT TERM GOALS: Target date: 07/30/2023  Pt will be independent with HEP to improve L shoulder strength and decrease pain with functional activities   Baseline:07/02/23: HEP given at today's session.  Goal status: INITIAL  LONG TERM GOALS: Target date: 08/27/2023  Pt will improve FOTO to target score to demonstrate clinically significant improvement in functional mobility.  Baseline: 07/02/23: Deferred to next session 07/09/23: 38/54 Goal status: INITIAL  2.  Pt will decrease pain to <5/10 to demonstrate clinically significant improvement in pain levels with functional activity and ADL's.  Baseline: 07/02/23: 7/10  NPRS Goal status: INITIAL  3.  Pt will improve all L shoulder AROM to WFL/ to match pt's R shoulder to improve functional mobility of the LUE.   Baseline: 07/02/23:  Active ROM Right eval Left eval  Shoulder flexion WFL 105*  Shoulder extension    Shoulder abduction WFL* 91*  Shoulder adduction    Shoulder internal rotation L1 L buttock/65 deg*  Shoulder external rotation T7* C2/35 deg*    Goal status: INITIAL  4.   Pt will self report the ability to sleep on her left arm positioned to pt comfort, w/o increased pain to demonstrate improvements with ability to improve sleep quality.  Baseline: 07/02/23: Unable to sleep on left arm w/o a pillow underneath.  Goal status: INITIAL    PLAN:  PT FREQUENCY: 2x/week  PT DURATION: 8 weeks  PLANNED INTERVENTIONS: Therapeutic exercises, Therapeutic activity, Neuromuscular re-education, Balance training, Gait training, Patient/Family education, Self Care, Joint mobilization, Joint manipulation, Spinal manipulation, Spinal mobilization, Cryotherapy, Moist heat, and Re-evaluation  PLAN FOR NEXT SESSION: Reassess tolerance to HEP, continue to progress L shoulder ROM and strengthening exercises.      Marney Doctor, Student-PT  Delphia Grates. Fairly IV, PT, DPT Physical Therapist- [place]  [facility]  07/11/23, 12:36 PM

## 2023-07-19 ENCOUNTER — Ambulatory Visit: Payer: Medicaid Other

## 2023-07-19 DIAGNOSIS — M6281 Muscle weakness (generalized): Secondary | ICD-10-CM

## 2023-07-19 DIAGNOSIS — M25512 Pain in left shoulder: Secondary | ICD-10-CM

## 2023-07-19 DIAGNOSIS — G8929 Other chronic pain: Secondary | ICD-10-CM

## 2023-07-19 DIAGNOSIS — M25612 Stiffness of left shoulder, not elsewhere classified: Secondary | ICD-10-CM

## 2023-07-19 NOTE — Therapy (Addendum)
OUTPATIENT PHYSICAL THERAPY SHOULDER TREATMENT    Patient Name: Carolyn Sampson MRN: [id number] DOB:Jun 26, 1961, 62 y.o., female Today's Date: 07/19/2023  END OF SESSION:  PT End of Session - 07/19/23 1504     Visit Number 4    Number of Visits 17    Date for PT Re-Evaluation 08/27/23    PT Start Time 1512    PT Stop Time 1558    PT Time Calculation (min) 46 min    Activity Tolerance Patient limited by pain    Behavior During Therapy [facility] for tasks assessed/performed             Past Medical History:  Diagnosis Date   Fatty liver    Pre-diabetes    Past Surgical History:  Procedure Laterality Date   ABDOMINAL HYSTERECTOMY     APPENDECTOMY     CESAREAN SECTION     CESAREAN SECTION     COLONOSCOPY WITH PROPOFOL N/A 06/02/2021   Procedure: COLONOSCOPY WITH PROPOFOL;  Surgeon: Toney Reil, MD;  Location: [facility];  Service: Gastroenterology;  Laterality: N/A;  SPANISH INTERPRETER   LAPAROSCOPIC HYSTERECTOMY     LAPAROSCOPIC OOPHERECTOMY Right    SHOULDER ARTHROSCOPY WITH ROTATOR CUFF REPAIR AND SUBACROMIAL DECOMPRESSION Left 08/22/2021   Procedure: SHOULDER ARTHROSCOPY WITH ROTATOR CUFF REPAIR AND SUBACROMIAL DECOMPRESSION;  Surgeon: Jones Broom, MD;  Location: [facility];  Service: Orthopedics;  Laterality: Left;   Patient Active Problem List   Diagnosis Date Noted   Screen for colon cancer    Muscle cramps 05/28/2018   Pre-diabetes 05/28/2018   Fatty liver 05/28/2018   Hair loss 05/28/2018   Sciatica 11/01/2015   S/P right oophorectomy 11/01/2015   S/P hysterectomy 11/01/2015    PCP: N/A  REFERRING PROVIDER: Jones Broom, MD  REFERRING DIAG:  M25.519 (ICD-10-CM) - Pain in unspecified shoulder    THERAPY DIAG:  Chronic left shoulder pain  Stiffness of left shoulder, not elsewhere classified  Muscle weakness (generalized)  Acute pain of left shoulder  Rationale for Evaluation and Treatment: Rehabilitation  ONSET  DATE: 07/17/2020  SUBJECTIVE:                                                                                                                                                                                      SUBJECTIVE STATEMENT: Pt states that the pain in L shoulder is a 6/10 NPS today. States she was doing her HEP and felt a sharp pain in the posterior aspect of her L shoulder.  Hand dominance: Right  PERTINENT HISTORY: Pt is a 62 y/o F w/ chronic bilat shoulder pain LUE> RUE. Pt s/p L shoulder arthroscopy w/  RTC repair and subacromial decompression in November, 2022. Pt's has had ongoing pain since this surgery and has had multiple bouts of physical therapy. Her previous physical therapy being in July, 2024 was d/c to have further orthopedic specialist treatment of the L shoulder. Pt received a cortisone injection in her L shoulder which decreased her pain mildly, and received a new referral to continue physical therapy. Pt reports increased R shoulder pain 2/2 to overuse to compensate from L shoulder pain/ weakness. Pt reports difficulty with carrying objects and lifting with her left arm. Pt's sx are relieved by ceasing movement, icing the L shoulder and mild relief from corticosteroids injections. Pain at best is 4-5/10 and pain at worst is a 7/10. Pt notes mild N/T traveling down the L arm. Along with, difficulty sleeping on her left side, increasing her pain and must have a pillow under her arm.   PAIN:  Are you having pain? Yes: NPRS scale: 6/10 Pain location: L lateral and anterior shoulder  Pain description: Sharp, stabbing Aggravating factors: Holding something, cooking, lifting arm up  Relieving factors: Steroid injection, ice 4-5/10 NPRS pain at best  PRECAUTIONS: None  RED FLAGS: None   WEIGHT BEARING RESTRICTIONS: No  FALLS:  Has patient fallen in last 6 months? No  OCCUPATION: Unemployed 2/2 L shoulder pain   PLOF: Independent  PATIENT GOALS: To reduce her pain.    NEXT MD VISIT:   OBJECTIVE:  Note: Objective measures were completed at Evaluation unless otherwise noted.  DIAGNOSTIC FINDINGS:  03/18/2022- IMPRESSION:  Status post left shoulder surgery 08/2021. Left shoulder pain and weakness. Compared to 07/12/2021:   1. Interval anterior supraspinatus tendon repair. The prior high-grade partial-thickness tear is no longer seen. There is moderate to high-grade anterior supraspinatus chronic tendinosis. 2. Moderate anterior infraspinatus and mild superior subscapularis chronic tendinosis. 3. Moderate proximal long head of the biceps tendinosis. 4. Likely interval partial distal clavicle excision and mild acromioplasty. 5. Mild-to-moderate subacromial/subdeltoid bursitis, similar to prior.  PATIENT SURVEYS:  FOTO 38/54    COGNITION: Overall cognitive status: Within functional limits for tasks assessed     SENSATION: UQNS: mild decrease in sensation at C2-C3 and C4-C5 dermatomal pattern   POSTURE: Rounded shoulders noted  UPPER EXTREMITY ROM:  Cervical AROM:       Lateral flexion L- 35deg R-45 deg*     Rotation L 50 deg R 60 deg*    Extension 20 deg*    Flexion 50 deg   Active ROM Right eval Left eval  Shoulder flexion WFL 105*  Shoulder extension    Shoulder abduction WFL* 91*  Shoulder adduction    Shoulder internal rotation L1 L buttock/65 deg*  Shoulder external rotation T7* C2/35 deg*  Elbow flexion    Elbow extension    Wrist flexion    Wrist extension    Wrist ulnar deviation    Wrist radial deviation    Wrist pronation    Wrist supination    (Blank rows = not tested)  PROM: L IR: 75 deg* L ER: 55 deg*  *denotes pain with movement  PROM: R Shoulder- Flexion and Abduction WFL w/ pain at end range PROM: L Shoulder- Flexion and Abduction limited to 90 degrees w/ pain throughout the motion and pain limiting further assessment.   UPPER EXTREMITY MMT:  MMT Right eval Left eval  Shoulder flexion 3+* 2-*   Shoulder extension    Shoulder abduction 3* 2-*  Shoulder adduction    Shoulder internal rotation 4- 2-*  Shoulder external rotation 4- 3+*  Middle trapezius    Lower trapezius    Elbow flexion    Elbow extension    Wrist flexion    Wrist extension    Wrist ulnar deviation    Wrist radial deviation    Wrist pronation    Wrist supination    Grip strength (lbs)    (Blank rows = not tested)  *denotes pain with movement  SHOULDER SPECIAL TESTS: Not performed 2/2 to increased irritability of pt's sx.   JOINT MOBILITY TESTING:   No significant hypomobility noted between R shoulder and L shoulder joint. L shoulder noting increased pain with muscle guarding. AP/PA and inferior bilaterally  PALPATION:  Anterior and lateral portion of left shoulder TTP    TODAY'S TREATMENT: DATE: 07/19/23  AAROM Pulleys:           Flexion w/ RUE/ LUE 1 x 2 minutes           Scaption w/ RUE/LUE 1 x 2 minutes           Abduction in scapular plane short lever arm w/ RUE/ LUE 1 x2 minutes Seated L shoulder ER AAROM w/ dowel 2 x12 Standing L shoulder abduction AAROM w/ dowel 2 x12 Seated shoulder flexion w/ 1# AW 2 x6 Standing abduction isometrics w/ towel against wall 5 x10 second hold (exercise d/c 2/2 pt's tolerance)  Biofreeze application to anterior, lateral, and posterior L shoulder. Not billed. Used for pain modulation.  PATIENT EDUCATION: Education details: HEP given to pt Person educated: Patient Education method: Medical illustrator Education comprehension: verbalized understanding and returned demonstration  HOME EXERCISE PROGRAM:   Access Code: [email or web address] URL: [email or web address] Date: 07/11/2023 Prepared by: Ronnie Derby  Exercises - Seated Upper Trapezius Stretch  - 1 x daily - 5-7 x weekly - 2 sets - 3 reps - 20 hold - Cervical Extension Stretch  - 1 x daily - 5-7 x weekly - 1 sets - 10 reps - 5 hold - Supine Shoulder Flexion Extension AAROM with  Dowel  - 1 x daily - 5-7 x weekly - 2 sets - 12 reps - Supine Shoulder External Rotation with Dowel  - 1 x daily - 5-7 x weekly - 2 sets - 12 reps - Seated Shoulder Abduction Towel Slide at Table Top  - 1 x daily - 5-7 x weekly - 3 sets - 6 reps   ASSESSMENT:  CLINICAL IMPRESSION: Session focused on improving L shoulder ROM and strength. Pt continues to be limited by L shoulder pain in all planes with AROM, AAROM, and PROM. Pt notes improved activity tolerance with L shoulder AAROM displayed with utilization of the pulleys at today's session. Pt has a pulley system from previous PT and HEP was updated and pt verbalized and demonstrated understanding of exercises. Biofreeze was applied to the L shoulder at today's session in hopes of decreasing inflammation and reducing pt's pain. Pt would benefit from skilled PT to address strength and pain deficits to improve QoL and return to PLOF.  OBJECTIVE IMPAIRMENTS: decreased mobility, decreased ROM, decreased strength, hypomobility, impaired flexibility, impaired UE functional use, and pain.   ACTIVITY LIMITATIONS: carrying, lifting, sleeping, and bed mobility  PARTICIPATION LIMITATIONS: cleaning, laundry, shopping, and community activity  PERSONAL FACTORS: Age, Past/current experiences, and Time since onset of injury/illness/exacerbation are also affecting patient's functional outcome.   REHAB POTENTIAL: Fair chronicity of L shoulder pain  CLINICAL DECISION MAKING: Evolving/moderate complexity  EVALUATION COMPLEXITY: Moderate   GOALS: Goals reviewed  with patient? Yes  SHORT TERM GOALS: Target date: 07/30/2023  Pt will be independent with HEP to improve L shoulder strength and decrease pain with functional activities   Baseline:07/02/23: HEP given at today's session.  Goal status: INITIAL  LONG TERM GOALS: Target date: 08/27/2023  Pt will improve FOTO to target score to demonstrate clinically significant improvement in functional mobility.   Baseline: 07/02/23: Deferred to next session 07/09/23: 38/54 Goal status: INITIAL  2.  Pt will decrease pain to <5/10 to demonstrate clinically significant improvement in pain levels with functional activity and ADL's.  Baseline: 07/02/23: 7/10 NPRS Goal status: INITIAL  3.  Pt will improve all L shoulder AROM to WFL/ to match pt's R shoulder to improve functional mobility of the LUE.  Baseline: 07/02/23:  Active ROM Right eval Left eval  Shoulder flexion WFL 105*  Shoulder extension    Shoulder abduction WFL* 91*  Shoulder adduction    Shoulder internal rotation L1 L buttock/65 deg*  Shoulder external rotation T7* C2/35 deg*    Goal status: INITIAL  4.   Pt will self report the ability to sleep on her left arm positioned to pt comfort, w/o increased pain to demonstrate improvements with ability to improve sleep quality.  Baseline: 07/02/23: Unable to sleep on left arm w/o a pillow underneath.  Goal status: INITIAL    PLAN:  PT FREQUENCY: 2x/week  PT DURATION: 8 weeks  PLANNED INTERVENTIONS: Therapeutic exercises, Therapeutic activity, Neuromuscular re-education, Balance training, Gait training, Patient/Family education, Self Care, Joint mobilization, Joint manipulation, Spinal manipulation, Spinal mobilization, Cryotherapy, Moist heat, and Re-evaluation  PLAN FOR NEXT SESSION: Reassess tolerance to Biofreeze, continue to progress L shoulder ROM and strengthening exercises.      Marney Doctor, Student-PT  Delphia Grates. Fairly IV, PT, DPT Physical Therapist- [facility]  [facility]  07/19/23, 4:32 PM

## 2023-07-31 ENCOUNTER — Ambulatory Visit: Payer: Medicaid Other

## 2023-07-31 DIAGNOSIS — M25512 Pain in left shoulder: Secondary | ICD-10-CM | POA: Diagnosis not present

## 2023-07-31 DIAGNOSIS — M25612 Stiffness of left shoulder, not elsewhere classified: Secondary | ICD-10-CM

## 2023-07-31 DIAGNOSIS — M6281 Muscle weakness (generalized): Secondary | ICD-10-CM

## 2023-07-31 DIAGNOSIS — G8929 Other chronic pain: Secondary | ICD-10-CM

## 2023-07-31 NOTE — Therapy (Addendum)
OUTPATIENT PHYSICAL THERAPY SHOULDER TREATMENT    Patient Name: Carolyn Sampson MRN: [id number] DOB:04/16/1961, 62 y.o., female Today's Date: 07/31/2023  END OF SESSION:  PT End of Session - 07/31/23 0818     Visit Number 5    Number of Visits 17    Date for PT Re-Evaluation 08/27/23    PT Start Time 0816    PT Stop Time 0859    PT Time Calculation (min) 43 min    Activity Tolerance Patient limited by pain    Behavior During Therapy [facility] for tasks assessed/performed             Past Medical History:  Diagnosis Date   Fatty liver    Pre-diabetes    Past Surgical History:  Procedure Laterality Date   ABDOMINAL HYSTERECTOMY     APPENDECTOMY     CESAREAN SECTION     CESAREAN SECTION     COLONOSCOPY WITH PROPOFOL N/A 06/02/2021   Procedure: COLONOSCOPY WITH PROPOFOL;  Surgeon: Toney Reil, MD;  Location: [facility];  Service: Gastroenterology;  Laterality: N/A;  SPANISH INTERPRETER   LAPAROSCOPIC HYSTERECTOMY     LAPAROSCOPIC OOPHERECTOMY Right    SHOULDER ARTHROSCOPY WITH ROTATOR CUFF REPAIR AND SUBACROMIAL DECOMPRESSION Left 08/22/2021   Procedure: SHOULDER ARTHROSCOPY WITH ROTATOR CUFF REPAIR AND SUBACROMIAL DECOMPRESSION;  Surgeon: Jones Broom, MD;  Location: [facility];  Service: Orthopedics;  Laterality: Left;   Patient Active Problem List   Diagnosis Date Noted   Screen for colon cancer    Muscle cramps 05/28/2018   Pre-diabetes 05/28/2018   Fatty liver 05/28/2018   Hair loss 05/28/2018   Sciatica 11/01/2015   S/P right oophorectomy 11/01/2015   S/P hysterectomy 11/01/2015    PCP: N/A  REFERRING PROVIDER: Jones Broom, MD  REFERRING DIAG:  M25.519 (ICD-10-CM) - Pain in unspecified shoulder    THERAPY DIAG:  Chronic left shoulder pain  Stiffness of left shoulder, not elsewhere classified  Muscle weakness (generalized)  Acute pain of left shoulder  Rationale for Evaluation and Treatment: Rehabilitation  ONSET  DATE: 07/17/2020  SUBJECTIVE:                                                                                                                                                                                      SUBJECTIVE STATEMENT: Pt states that the pain in L shoulder is a 1/10 NPS today. Pt reports taking a pain pill last night that helped to alleviate the pain. States she is HEP compliant.No notable changes over the weekend.  Hand dominance: Right  PERTINENT HISTORY: Pt is a 62 y/o F w/ chronic bilat shoulder pain LUE> RUE. Pt  s/p L shoulder arthroscopy w/ RTC repair and subacromial decompression in November, 2022. Pt's has had ongoing pain since this surgery and has had multiple bouts of physical therapy. Her previous physical therapy being in July, 2024 was d/c to have further orthopedic specialist treatment of the L shoulder. Pt received a cortisone injection in her L shoulder which decreased her pain mildly, and received a new referral to continue physical therapy. Pt reports increased R shoulder pain 2/2 to overuse to compensate from L shoulder pain/ weakness. Pt reports difficulty with carrying objects and lifting with her left arm. Pt's sx are relieved by ceasing movement, icing the L shoulder and mild relief from corticosteroids injections. Pain at best is 4-5/10 and pain at worst is a 7/10. Pt notes mild N/T traveling down the L arm. Along with, difficulty sleeping on her left side, increasing her pain and must have a pillow under her arm.   PAIN:  Are you having pain? Yes: NPRS scale: 6/10 Pain location: L lateral and anterior shoulder  Pain description: Sharp, stabbing Aggravating factors: Holding something, cooking, lifting arm up  Relieving factors: Steroid injection, ice 4-5/10 NPRS pain at best  PRECAUTIONS: None  RED FLAGS: None   WEIGHT BEARING RESTRICTIONS: No  FALLS:  Has patient fallen in last 6 months? No  OCCUPATION: Unemployed 2/2 L shoulder pain   PLOF:  Independent  PATIENT GOALS: To reduce her pain.   NEXT MD VISIT:   OBJECTIVE:  Note: Objective measures were completed at Evaluation unless otherwise noted.  DIAGNOSTIC FINDINGS:  03/18/2022- IMPRESSION:  Status post left shoulder surgery 08/2021. Left shoulder pain and weakness. Compared to 07/12/2021:   1. Interval anterior supraspinatus tendon repair. The prior high-grade partial-thickness tear is no longer seen. There is moderate to high-grade anterior supraspinatus chronic tendinosis. 2. Moderate anterior infraspinatus and mild superior subscapularis chronic tendinosis. 3. Moderate proximal long head of the biceps tendinosis. 4. Likely interval partial distal clavicle excision and mild acromioplasty. 5. Mild-to-moderate subacromial/subdeltoid bursitis, similar to prior.  PATIENT SURVEYS:  FOTO 38/54    COGNITION: Overall cognitive status: Within functional limits for tasks assessed     SENSATION: UQNS: mild decrease in sensation at C2-C3 and C4-C5 dermatomal pattern   POSTURE: Rounded shoulders noted  UPPER EXTREMITY ROM:  Cervical AROM:       Lateral flexion L- 35deg R-45 deg*     Rotation L 50 deg R 60 deg*    Extension 20 deg*    Flexion 50 deg   Active ROM Right eval Left eval  Shoulder flexion WFL 105*  Shoulder extension    Shoulder abduction WFL* 91*  Shoulder adduction    Shoulder internal rotation L1 L buttock/65 deg*  Shoulder external rotation T7* C2/35 deg*  Elbow flexion    Elbow extension    Wrist flexion    Wrist extension    Wrist ulnar deviation    Wrist radial deviation    Wrist pronation    Wrist supination    (Blank rows = not tested)  PROM: L IR: 75 deg* L ER: 55 deg*  *denotes pain with movement  PROM: R Shoulder- Flexion and Abduction WFL w/ pain at end range PROM: L Shoulder- Flexion and Abduction limited to 90 degrees w/ pain throughout the motion and pain limiting further assessment.   UPPER EXTREMITY MMT:  MMT  Right eval Left eval  Shoulder flexion 3+* 2-*  Shoulder extension    Shoulder abduction 3* 2-*  Shoulder adduction  Shoulder internal rotation 4- 2-*  Shoulder external rotation 4- 3+*  Middle trapezius    Lower trapezius    Elbow flexion    Elbow extension    Wrist flexion    Wrist extension    Wrist ulnar deviation    Wrist radial deviation    Wrist pronation    Wrist supination    Grip strength (lbs)    (Blank rows = not tested)  *denotes pain with movement  SHOULDER SPECIAL TESTS: Not performed 2/2 to increased irritability of pt's sx.   JOINT MOBILITY TESTING:   No significant hypomobility noted between R shoulder and L shoulder joint. L shoulder noting increased pain with muscle guarding. AP/PA and inferior bilaterally  PALPATION:  Anterior and lateral portion of left shoulder TTP    TODAY'S TREATMENT: DATE: 07/31/23  AAROM Pulleys:           Flexion w/ RUE/ LUE 1 x 2 minutes           Abduction in scapular plane short lever arm w/ RUE/ LUE 1 x2 minutes Standing L shoulder abduction AAROM w/ dowel x10 Seated L shoulder ER AAROM w/ dowel x10 Standing L shoulder flexion w/ 1# AW on cane 2 x6  Standing shoulder rows w/ RTB 2 x12 Standing shoulder extensions w/ RTB 2 x8  Standing ER walk outs with LUE only w/ RTB x5 down and back  Standing L shoulder abduction slides w/ towel on rail 2 x8  Biofreeze application to anterior, lateral, and posterior L shoulder. Not billed. Used for pain modulation.  L shoulder abduction AAROM: 80 deg  PATIENT EDUCATION: Education details: HEP given to pt Person educated: Patient Education method: Medical illustrator Education comprehension: verbalized understanding and returned demonstration  HOME EXERCISE PROGRAM:   Access Code: [email or web address] URL: [email or web address] Date: 07/11/2023 Prepared by: Ronnie Derby  Exercises - Seated Upper Trapezius Stretch  - 1 x daily - 5-7 x weekly - 2 sets - 3  reps - 20 hold - Cervical Extension Stretch  - 1 x daily - 5-7 x weekly - 1 sets - 10 reps - 5 hold - Supine Shoulder Flexion Extension AAROM with Dowel  - 1 x daily - 5-7 x weekly - 2 sets - 12 reps - Supine Shoulder External Rotation with Dowel  - 1 x daily - 5-7 x weekly - 2 sets - 12 reps - Seated Shoulder Abduction Towel Slide at Table Top  - 1 x daily - 5-7 x weekly - 3 sets - 6 reps   ASSESSMENT:  CLINICAL IMPRESSION: Session focused on improving L shoulder ROM and strength. Pt presenting with decreased irritability of sx at today's session allowing for progression to be made in LUE exercises. Pt continues to note deficits in L shoulder AROM flexion, ER, and abduction displayed with increased pain at end range during exercises. Pt notes progression in L shoulder strength noted w/ improved activity tolerance at today's session. Biofreeze was applied to the L shoulder at today's session in hopes of decreasing inflammation and reducing pt's pain. Pt would benefit from skilled PT to address strength and pain deficits to improve QoL and return to PLOF.  OBJECTIVE IMPAIRMENTS: decreased mobility, decreased ROM, decreased strength, hypomobility, impaired flexibility, impaired UE functional use, and pain.   ACTIVITY LIMITATIONS: carrying, lifting, sleeping, and bed mobility  PARTICIPATION LIMITATIONS: cleaning, laundry, shopping, and community activity  PERSONAL FACTORS: Age, Past/current experiences, and Time since onset of injury/illness/exacerbation are also affecting patient's functional outcome.  REHAB POTENTIAL: Fair chronicity of L shoulder pain  CLINICAL DECISION MAKING: Evolving/moderate complexity  EVALUATION COMPLEXITY: Moderate   GOALS: Goals reviewed with patient? Yes  SHORT TERM GOALS: Target date: 07/30/2023  Pt will be independent with HEP to improve L shoulder strength and decrease pain with functional activities   Baseline:07/02/23: HEP given at today's session.   Goal status: INITIAL  LONG TERM GOALS: Target date: 08/27/2023  Pt will improve FOTO to target score to demonstrate clinically significant improvement in functional mobility.  Baseline: 07/02/23: Deferred to next session 07/09/23: 38/54 Goal status: INITIAL  2.  Pt will decrease pain to <5/10 to demonstrate clinically significant improvement in pain levels with functional activity and ADL's.  Baseline: 07/02/23: 7/10 NPRS Goal status: INITIAL  3.  Pt will improve all L shoulder AROM to WFL/ to match pt's R shoulder to improve functional mobility of the LUE.  Baseline: 07/02/23:  Active ROM Right eval Left eval  Shoulder flexion WFL 105*  Shoulder extension    Shoulder abduction WFL* 91*  Shoulder adduction    Shoulder internal rotation L1 L buttock/65 deg*  Shoulder external rotation T7* C2/35 deg*    Goal status: INITIAL  4.   Pt will self report the ability to sleep on her left arm positioned to pt comfort, w/o increased pain to demonstrate improvements with ability to improve sleep quality.  Baseline: 07/02/23: Unable to sleep on left arm w/o a pillow underneath.  Goal status: INITIAL    PLAN:  PT FREQUENCY: 2x/week  PT DURATION: 8 weeks  PLANNED INTERVENTIONS: Therapeutic exercises, Therapeutic activity, Neuromuscular re-education, Balance training, Gait training, Patient/Family education, Self Care, Joint mobilization, Joint manipulation, Spinal manipulation, Spinal mobilization, Cryotherapy, Moist heat, and Re-evaluation  PLAN FOR NEXT SESSION: Reassess tolerance to new HEP exercises, Continue to progress L shoulder ROM and strengthening exercises.      Marney Doctor, Student-PT  Delphia Grates. Fairly IV, PT, DPT Physical Therapist- [facility]  07/31/23, 1:38 PM

## 2023-08-02 ENCOUNTER — Ambulatory Visit: Payer: Medicaid Other

## 2023-08-02 DIAGNOSIS — M6281 Muscle weakness (generalized): Secondary | ICD-10-CM

## 2023-08-02 DIAGNOSIS — M25512 Pain in left shoulder: Secondary | ICD-10-CM | POA: Diagnosis not present

## 2023-08-02 DIAGNOSIS — M25612 Stiffness of left shoulder, not elsewhere classified: Secondary | ICD-10-CM

## 2023-08-02 DIAGNOSIS — G8929 Other chronic pain: Secondary | ICD-10-CM

## 2023-08-02 NOTE — Therapy (Addendum)
OUTPATIENT PHYSICAL THERAPY SHOULDER TREATMENT    Patient Name: Carolyn Sampson MRN: [id number] DOB:03-20-1961, 62 y.o., female Today's Date: 08/02/2023  END OF SESSION:  PT End of Session - 08/02/23 0900     Visit Number 6    Number of Visits 17    Date for PT Re-Evaluation 08/27/23    PT Start Time 0900    PT Stop Time 0942    PT Time Calculation (min) 42 min    Activity Tolerance Patient limited by pain    Behavior During Therapy [facility] for tasks assessed/performed             Past Medical History:  Diagnosis Date   Fatty liver    Pre-diabetes    Past Surgical History:  Procedure Laterality Date   ABDOMINAL HYSTERECTOMY     APPENDECTOMY     CESAREAN SECTION     CESAREAN SECTION     COLONOSCOPY WITH PROPOFOL N/A 06/02/2021   Procedure: COLONOSCOPY WITH PROPOFOL;  Surgeon: Toney Reil, MD;  Location: [facility];  Service: Gastroenterology;  Laterality: N/A;  SPANISH INTERPRETER   LAPAROSCOPIC HYSTERECTOMY     LAPAROSCOPIC OOPHERECTOMY Right    SHOULDER ARTHROSCOPY WITH ROTATOR CUFF REPAIR AND SUBACROMIAL DECOMPRESSION Left 08/22/2021   Procedure: SHOULDER ARTHROSCOPY WITH ROTATOR CUFF REPAIR AND SUBACROMIAL DECOMPRESSION;  Surgeon: Jones Broom, MD;  Location: [facility];  Service: Orthopedics;  Laterality: Left;   Patient Active Problem List   Diagnosis Date Noted   Screen for colon cancer    Muscle cramps 05/28/2018   Pre-diabetes 05/28/2018   Fatty liver 05/28/2018   Hair loss 05/28/2018   Sciatica 11/01/2015   S/P right oophorectomy 11/01/2015   S/P hysterectomy 11/01/2015    PCP: N/A  REFERRING PROVIDER: Jones Broom, MD  REFERRING DIAG:  M25.519 (ICD-10-CM) - Pain in unspecified shoulder    THERAPY DIAG:  Chronic left shoulder pain  Stiffness of left shoulder, not elsewhere classified  Muscle weakness (generalized)  Rationale for Evaluation and Treatment: Rehabilitation  ONSET DATE: 07/17/2020  SUBJECTIVE:                                                                                                                                                                                       SUBJECTIVE STATEMENT: Pt reports no pain in the L shoulder at today's session, following using a pain pill last night. She reports a decrease in pain following Biofreeze application at the last session.  Hand dominance: Right  PERTINENT HISTORY: Pt is a 62 y/o F w/ chronic bilat shoulder pain LUE> RUE. Pt s/p L shoulder arthroscopy w/ RTC repair and subacromial decompression in November,  2022. Pt's has had ongoing pain since this surgery and has had multiple bouts of physical therapy. Her previous physical therapy being in July, 2024 was d/c to have further orthopedic specialist treatment of the L shoulder. Pt received a cortisone injection in her L shoulder which decreased her pain mildly, and received a new referral to continue physical therapy. Pt reports increased R shoulder pain 2/2 to overuse to compensate from L shoulder pain/ weakness. Pt reports difficulty with carrying objects and lifting with her left arm. Pt's sx are relieved by ceasing movement, icing the L shoulder and mild relief from corticosteroids injections. Pain at best is 4-5/10 and pain at worst is a 7/10. Pt notes mild N/T traveling down the L arm. Along with, difficulty sleeping on her left side, increasing her pain and must have a pillow under her arm.   PAIN:  Are you having pain? Yes: NPRS scale: 0/10 Pain location: L lateral and anterior shoulder  Pain description: Sharp, stabbing Aggravating factors: Holding something, cooking, lifting arm up  Relieving factors: Steroid injection, ice 4-5/10 NPRS pain at best  PRECAUTIONS: None  RED FLAGS: None   WEIGHT BEARING RESTRICTIONS: No  FALLS:  Has patient fallen in last 6 months? No  OCCUPATION: Unemployed 2/2 L shoulder pain   PLOF: Independent  PATIENT GOALS: To reduce her pain.    NEXT MD VISIT:   OBJECTIVE:  Note: Objective measures were completed at Evaluation unless otherwise noted.  DIAGNOSTIC FINDINGS:  03/18/2022- IMPRESSION:  Status post left shoulder surgery 08/2021. Left shoulder pain and weakness. Compared to 07/12/2021:   1. Interval anterior supraspinatus tendon repair. The prior high-grade partial-thickness tear is no longer seen. There is moderate to high-grade anterior supraspinatus chronic tendinosis. 2. Moderate anterior infraspinatus and mild superior subscapularis chronic tendinosis. 3. Moderate proximal long head of the biceps tendinosis. 4. Likely interval partial distal clavicle excision and mild acromioplasty. 5. Mild-to-moderate subacromial/subdeltoid bursitis, similar to prior.  PATIENT SURVEYS:  FOTO 38/54    COGNITION: Overall cognitive status: Within functional limits for tasks assessed     SENSATION: UQNS: mild decrease in sensation at C2-C3 and C4-C5 dermatomal pattern   POSTURE: Rounded shoulders noted  UPPER EXTREMITY ROM:  Cervical AROM:       Lateral flexion L- 35deg R-45 deg*     Rotation L 50 deg R 60 deg*    Extension 20 deg*    Flexion 50 deg   Active ROM Right eval Left eval  Shoulder flexion WFL 105*  Shoulder extension    Shoulder abduction WFL* 91*  Shoulder adduction    Shoulder internal rotation L1 L buttock/65 deg*  Shoulder external rotation T7* C2/35 deg*  Elbow flexion    Elbow extension    Wrist flexion    Wrist extension    Wrist ulnar deviation    Wrist radial deviation    Wrist pronation    Wrist supination    (Blank rows = not tested)  PROM: L IR: 75 deg* L ER: 55 deg*  *denotes pain with movement  PROM: R Shoulder- Flexion and Abduction WFL w/ pain at end range PROM: L Shoulder- Flexion and Abduction limited to 90 degrees w/ pain throughout the motion and pain limiting further assessment.   UPPER EXTREMITY MMT:  MMT Right eval Left eval  Shoulder flexion 3+* 2-*   Shoulder extension    Shoulder abduction 3* 2-*  Shoulder adduction    Shoulder internal rotation 4- 2-*  Shoulder external rotation 4- 3+*  Middle trapezius    Lower trapezius    Elbow flexion    Elbow extension    Wrist flexion    Wrist extension    Wrist ulnar deviation    Wrist radial deviation    Wrist pronation    Wrist supination    Grip strength (lbs)    (Blank rows = not tested)  *denotes pain with movement  SHOULDER SPECIAL TESTS: Not performed 2/2 to increased irritability of pt's sx.   JOINT MOBILITY TESTING:   No significant hypomobility noted between R shoulder and L shoulder joint. L shoulder noting increased pain with muscle guarding. AP/PA and inferior bilaterally  PALPATION:  Anterior and lateral portion of left shoulder TTP    TODAY'S TREATMENT: DATE: 08/02/23  AAROM Pulleys:           Flexion w/ RUE/ LUE 1 x 2 minutes           Abduction in scapular plane short lever arm w/ RUE/ LUE 1 x2 minutes Seated L shoulder flexion w 1# AW on dowel x6 Seated L shoulder flexion w/ dowel only x10  Standing L shoulder abduction AAROM w/ dowel x10 Standing ER walk outs with LUE only w/ RTB 2 x5 down and back  Standing shoulder rows w/ GTB 2 x12 Standing tricep extensions with RTB 3 x8 LUE only Seated bicep curls with 2# DB 2 x10 LUE only UBE x3 minutes lvl 2.0 Biofreeze application to anterior, lateral, and posterior L and R shoulder. Not billed. Used for pain modulation.  PATIENT EDUCATION: Education details: HEP given to pt Person educated: Patient Education method: Medical illustrator Education comprehension: verbalized understanding and returned demonstration  HOME EXERCISE PROGRAM:   Access Code: [email or web address] URL: [email or web address] Date: 07/11/2023 Prepared by: Ronnie Derby  Exercises - Seated Upper Trapezius Stretch  - 1 x daily - 5-7 x weekly - 2 sets - 3 reps - 20 hold - Cervical Extension Stretch  - 1 x daily - 5-7 x  weekly - 1 sets - 10 reps - 5 hold - Supine Shoulder Flexion Extension AAROM with Dowel  - 1 x daily - 5-7 x weekly - 2 sets - 12 reps - Supine Shoulder External Rotation with Dowel  - 1 x daily - 5-7 x weekly - 2 sets - 12 reps - Seated Shoulder Abduction Towel Slide at Table Top  - 1 x daily - 5-7 x weekly - 3 sets - 6 reps   ASSESSMENT:  CLINICAL IMPRESSION: Session focused on improving L shoulder ROM and strength. Pt notes improvements in L shoulder flexion and abduction AROM, however continues to note increased pain at end range with these movements. Pt notes improved activity tolerance to triceps extensions noting no significant increase in pain, however notes mild L shoulder pain with bicep curls w/ 2# DB and ER walkouts with the RTB. Future sessions will continue to emphasize L shoulder strengthening activities to enhance ability to complete ADL's and improve functional mobility. Pt would benefit from skilled PT to address strength and pain deficits to improve QoL and return to PLOF.  OBJECTIVE IMPAIRMENTS: decreased mobility, decreased ROM, decreased strength, hypomobility, impaired flexibility, impaired UE functional use, and pain.   ACTIVITY LIMITATIONS: carrying, lifting, sleeping, and bed mobility  PARTICIPATION LIMITATIONS: cleaning, laundry, shopping, and community activity  PERSONAL FACTORS: Age, Past/current experiences, and Time since onset of injury/illness/exacerbation are also affecting patient's functional outcome.   REHAB POTENTIAL: Fair chronicity of L shoulder pain  CLINICAL DECISION MAKING: Evolving/moderate complexity  EVALUATION COMPLEXITY: Moderate   GOALS: Goals reviewed with patient? Yes  SHORT TERM GOALS: Target date: 07/30/2023  Pt will be independent with HEP to improve L shoulder strength and decrease pain with functional activities   Baseline:07/02/23: HEP given at today's session.  Goal status: INITIAL  LONG TERM GOALS: Target date:  08/27/2023  Pt will improve FOTO to target score to demonstrate clinically significant improvement in functional mobility.  Baseline: 07/02/23: Deferred to next session 07/09/23: 38/54 Goal status: INITIAL  2.  Pt will decrease pain to <5/10 to demonstrate clinically significant improvement in pain levels with functional activity and ADL's.  Baseline: 07/02/23: 7/10 NPRS Goal status: INITIAL  3.  Pt will improve all L shoulder AROM to WFL/ to match pt's R shoulder to improve functional mobility of the LUE.  Baseline: 07/02/23:  Active ROM Right eval Left eval  Shoulder flexion WFL 105*  Shoulder extension    Shoulder abduction WFL* 91*  Shoulder adduction    Shoulder internal rotation L1 L buttock/65 deg*  Shoulder external rotation T7* C2/35 deg*    Goal status: INITIAL  4.   Pt will self report the ability to sleep on her left arm positioned to pt comfort, w/o increased pain to demonstrate improvements with ability to improve sleep quality.  Baseline: 07/02/23: Unable to sleep on left arm w/o a pillow underneath.  Goal status: INITIAL    PLAN:  PT FREQUENCY: 2x/week  PT DURATION: 8 weeks  PLANNED INTERVENTIONS: Therapeutic exercises, Therapeutic activity, Neuromuscular re-education, Balance training, Gait training, Patient/Family education, Self Care, Joint mobilization, Joint manipulation, Spinal manipulation, Spinal mobilization, Cryotherapy, Moist heat, and Re-evaluation  PLAN FOR NEXT SESSION: Continue to progress L shoulder ROM and strengthening exercises.      Marney Doctor, Student-PT  Delphia Grates. Fairly IV, PT, DPT Physical Therapist- [place]  [facility]  08/02/23, 1:05 PM

## 2023-08-06 ENCOUNTER — Ambulatory Visit: Payer: Medicaid Other | Attending: Orthopedic Surgery

## 2023-08-06 DIAGNOSIS — G8929 Other chronic pain: Secondary | ICD-10-CM

## 2023-08-06 DIAGNOSIS — M25612 Stiffness of left shoulder, not elsewhere classified: Secondary | ICD-10-CM

## 2023-08-06 DIAGNOSIS — M25512 Pain in left shoulder: Secondary | ICD-10-CM

## 2023-08-06 DIAGNOSIS — M6281 Muscle weakness (generalized): Secondary | ICD-10-CM | POA: Diagnosis present

## 2023-08-06 NOTE — Therapy (Cosign Needed)
OUTPATIENT PHYSICAL THERAPY SHOULDER TREATMENT    Patient Name: Carolyn Sampson MRN: [id number] DOB:06/12/1961, 62 y.o., female Today's Date: 08/07/2023  END OF SESSION:  PT End of Session - 08/06/23 1517     Visit Number 7    Number of Visits 17    Date for PT Re-Evaluation 08/27/23    PT Start Time 1517    PT Stop Time 1600    PT Time Calculation (min) 43 min    Activity Tolerance Patient limited by pain    Behavior During Therapy [facility] for tasks assessed/performed             Past Medical History:  Diagnosis Date   Fatty liver    Pre-diabetes    Past Surgical History:  Procedure Laterality Date   ABDOMINAL HYSTERECTOMY     APPENDECTOMY     CESAREAN SECTION     CESAREAN SECTION     COLONOSCOPY WITH PROPOFOL N/A 06/02/2021   Procedure: COLONOSCOPY WITH PROPOFOL;  Surgeon: Toney Reil, MD;  Location: [facility];  Service: Gastroenterology;  Laterality: N/A;  SPANISH INTERPRETER   LAPAROSCOPIC HYSTERECTOMY     LAPAROSCOPIC OOPHERECTOMY Right    SHOULDER ARTHROSCOPY WITH ROTATOR CUFF REPAIR AND SUBACROMIAL DECOMPRESSION Left 08/22/2021   Procedure: SHOULDER ARTHROSCOPY WITH ROTATOR CUFF REPAIR AND SUBACROMIAL DECOMPRESSION;  Surgeon: Jones Broom, MD;  Location: [facility];  Service: Orthopedics;  Laterality: Left;   Patient Active Problem List   Diagnosis Date Noted   Screen for colon cancer    Muscle cramps 05/28/2018   Pre-diabetes 05/28/2018   Fatty liver 05/28/2018   Hair loss 05/28/2018   Sciatica 11/01/2015   S/P right oophorectomy 11/01/2015   S/P hysterectomy 11/01/2015    PCP: N/A  REFERRING PROVIDER: Jones Broom, MD  REFERRING DIAG:  M25.519 (ICD-10-CM) - Pain in unspecified shoulder    THERAPY DIAG:  Chronic left shoulder pain  Stiffness of left shoulder, not elsewhere classified  Muscle weakness (generalized)  Acute pain of left shoulder  Rationale for Evaluation and Treatment: Rehabilitation  ONSET  DATE: 07/17/2020  SUBJECTIVE:                                                                                                                                                                                      SUBJECTIVE STATEMENT: Pt reports no pain in the L shoulder at today's session. She reports using a pain pill to help her sleep at night but no pain meds today. No notable changes since last session.   Hand dominance: Right  PERTINENT HISTORY: Pt is a 62 y/o F w/ chronic bilat shoulder pain LUE> RUE. Pt s/p L  shoulder arthroscopy w/ RTC repair and subacromial decompression in November, 2022. Pt's has had ongoing pain since this surgery and has had multiple bouts of physical therapy. Her previous physical therapy being in July, 2024 was d/c to have further orthopedic specialist treatment of the L shoulder. Pt received a cortisone injection in her L shoulder which decreased her pain mildly, and received a new referral to continue physical therapy. Pt reports increased R shoulder pain 2/2 to overuse to compensate from L shoulder pain/ weakness. Pt reports difficulty with carrying objects and lifting with her left arm. Pt's sx are relieved by ceasing movement, icing the L shoulder and mild relief from corticosteroids injections. Pain at best is 4-5/10 and pain at worst is a 7/10. Pt notes mild N/T traveling down the L arm. Along with, difficulty sleeping on her left side, increasing her pain and must have a pillow under her arm.   PAIN:  Are you having pain? Yes: NPRS scale: 0/10 Pain location: L lateral and anterior shoulder  Pain description: Sharp, stabbing Aggravating factors: Holding something, cooking, lifting arm up  Relieving factors: Steroid injection, ice 4-5/10 NPRS pain at best  PRECAUTIONS: None  RED FLAGS: None   WEIGHT BEARING RESTRICTIONS: No  FALLS:  Has patient fallen in last 6 months? No  OCCUPATION: Unemployed 2/2 L shoulder pain   PLOF: Independent  PATIENT  GOALS: To reduce her pain.   NEXT MD VISIT:   OBJECTIVE:  Note: Objective measures were completed at Evaluation unless otherwise noted.  DIAGNOSTIC FINDINGS:  03/18/2022- IMPRESSION:  Status post left shoulder surgery 08/2021. Left shoulder pain and weakness. Compared to 07/12/2021:   1. Interval anterior supraspinatus tendon repair. The prior high-grade partial-thickness tear is no longer seen. There is moderate to high-grade anterior supraspinatus chronic tendinosis. 2. Moderate anterior infraspinatus and mild superior subscapularis chronic tendinosis. 3. Moderate proximal long head of the biceps tendinosis. 4. Likely interval partial distal clavicle excision and mild acromioplasty. 5. Mild-to-moderate subacromial/subdeltoid bursitis, similar to prior.  PATIENT SURVEYS:  FOTO 38/54    COGNITION: Overall cognitive status: Within functional limits for tasks assessed     SENSATION: UQNS: mild decrease in sensation at C2-C3 and C4-C5 dermatomal pattern   POSTURE: Rounded shoulders noted  UPPER EXTREMITY ROM:  Cervical AROM:       Lateral flexion L- 35deg R-45 deg*     Rotation L 50 deg R 60 deg*    Extension 20 deg*    Flexion 50 deg   Active ROM Right eval Left eval  Shoulder flexion WFL 105*  Shoulder extension    Shoulder abduction WFL* 91*  Shoulder adduction    Shoulder internal rotation L1 L buttock/65 deg*  Shoulder external rotation T7* C2/35 deg*  Elbow flexion    Elbow extension    Wrist flexion    Wrist extension    Wrist ulnar deviation    Wrist radial deviation    Wrist pronation    Wrist supination    (Blank rows = not tested)  PROM: L IR: 75 deg* L ER: 55 deg*  *denotes pain with movement  PROM: R Shoulder- Flexion and Abduction WFL w/ pain at end range PROM: L Shoulder- Flexion and Abduction limited to 90 degrees w/ pain throughout the motion and pain limiting further assessment.   UPPER EXTREMITY MMT:  MMT Right eval Left eval   Shoulder flexion 3+* 2-*  Shoulder extension    Shoulder abduction 3* 2-*  Shoulder adduction    Shoulder internal  rotation 4- 2-*  Shoulder external rotation 4- 3+*  Middle trapezius    Lower trapezius    Elbow flexion    Elbow extension    Wrist flexion    Wrist extension    Wrist ulnar deviation    Wrist radial deviation    Wrist pronation    Wrist supination    Grip strength (lbs)    (Blank rows = not tested)  *denotes pain with movement  SHOULDER SPECIAL TESTS: Not performed 2/2 to increased irritability of pt's sx.   JOINT MOBILITY TESTING:   No significant hypomobility noted between R shoulder and L shoulder joint. L shoulder noting increased pain with muscle guarding. AP/PA and inferior bilaterally  PALPATION:  Anterior and lateral portion of left shoulder TTP    TODAY'S TREATMENT: DATE: 08/06/23  AAROM Pulleys:           Flexion w/ RUE/ LUE 1 x 2 minutes           Abduction in frontal plane long lever arm w/ RUE/ LUE 1 x2 minutes Standing L shoulder flexion w/ yellow theraband exercise d/c due to increased pain resulting in attempting in supine; exercise d/c again 2/2 pain  Supine L shoulder flexion w/ 1# DB to 90 degrees 2 x10 Supine L shoulder serratus punches w/ 1# DB x8  Standing ER walk outs with LUE only w/ RTB 2 x8 down and back  Standing IR walk outs with LUE only w/ RTB x8 down and back Standing shoulder rows w/ Blue TB 2 x12 Biofreeze application to anterior, lateral, and posterior L and R shoulder. Not billed. Used for pain modulation.  PATIENT EDUCATION: Education details: HEP given to pt Person educated: Patient Education method: Medical illustrator Education comprehension: verbalized understanding and returned demonstration  HOME EXERCISE PROGRAM:   Access Code: [email or web address] URL: [email or web address] Date: 07/11/2023 Prepared by: Ronnie Derby  Exercises - Seated Upper Trapezius Stretch  - 1 x daily - 5-7 x weekly  - 2 sets - 3 reps - 20 hold - Cervical Extension Stretch  - 1 x daily - 5-7 x weekly - 1 sets - 10 reps - 5 hold - Supine Shoulder Flexion Extension AAROM with Dowel  - 1 x daily - 5-7 x weekly - 2 sets - 12 reps - Supine Shoulder External Rotation with Dowel  - 1 x daily - 5-7 x weekly - 2 sets - 12 reps - Seated Shoulder Abduction Towel Slide at Table Top  - 1 x daily - 5-7 x weekly - 3 sets - 6 reps   ASSESSMENT:  CLINICAL IMPRESSION: Session focused on progressing L shoulder strengthening and ROM exercises. Pt displays improvements in L shoulder AAROM noted when using the pulley system; however, continues to note increased pain persists with AROM greater than 90 degrees. Additionally, the pt exhibits deficits in L shoulder strength, noted with the inability to complete L resisted shoulder flexion activities (see above) without a significant increase in pain. Modifications were made to exercises to prevent exacerbation of symptoms while encouraging gradual progression. Continued focus on pain management and strength training will be essential for further improvement in shoulder function. Pt would continue to benefit from skilled PT to address L shoulder strength and pain deficits to improve QoL and return to PLOF.  OBJECTIVE IMPAIRMENTS: decreased mobility, decreased ROM, decreased strength, hypomobility, impaired flexibility, impaired UE functional use, and pain.   ACTIVITY LIMITATIONS: carrying, lifting, sleeping, and bed mobility  PARTICIPATION LIMITATIONS: cleaning, laundry, shopping, and community  activity  PERSONAL FACTORS: Age, Past/current experiences, and Time since onset of injury/illness/exacerbation are also affecting patient's functional outcome.   REHAB POTENTIAL: Fair chronicity of L shoulder pain  CLINICAL DECISION MAKING: Evolving/moderate complexity  EVALUATION COMPLEXITY: Moderate   GOALS: Goals reviewed with patient? Yes  SHORT TERM GOALS: Target date:  07/30/2023  Pt will be independent with HEP to improve L shoulder strength and decrease pain with functional activities   Baseline:07/02/23: HEP given at today's session.  Goal status: INITIAL  LONG TERM GOALS: Target date: 08/27/2023  Pt will improve FOTO to target score to demonstrate clinically significant improvement in functional mobility.  Baseline: 07/02/23: Deferred to next session 07/09/23: 38/54 Goal status: INITIAL  2.  Pt will decrease pain to <5/10 to demonstrate clinically significant improvement in pain levels with functional activity and ADL's.  Baseline: 07/02/23: 7/10 NPRS Goal status: INITIAL  3.  Pt will improve all L shoulder AROM to WFL/ to match pt's R shoulder to improve functional mobility of the LUE.  Baseline: 07/02/23:  Active ROM Right eval Left eval  Shoulder flexion WFL 105*  Shoulder extension    Shoulder abduction WFL* 91*  Shoulder adduction    Shoulder internal rotation L1 L buttock/65 deg*  Shoulder external rotation T7* C2/35 deg*    Goal status: INITIAL  4.   Pt will self report the ability to sleep on her left arm positioned to pt comfort, w/o increased pain to demonstrate improvements with ability to improve sleep quality.  Baseline: 07/02/23: Unable to sleep on left arm w/o a pillow underneath.  Goal status: INITIAL    PLAN:  PT FREQUENCY: 2x/week  PT DURATION: 8 weeks  PLANNED INTERVENTIONS: Therapeutic exercises, Therapeutic activity, Neuromuscular re-education, Balance training, Gait training, Patient/Family education, Self Care, Joint mobilization, Joint manipulation, Spinal manipulation, Spinal mobilization, Cryotherapy, Moist heat, and Re-evaluation  PLAN FOR NEXT SESSION: UE ranger, Continue to progress L shoulder AROM and strengthening exercises.      Marney Doctor, Student-PT  Delphia Grates. Fairly IV, PT, DPT Physical Therapist- [facility]  08/07/23, 8:18 AM

## 2023-08-08 ENCOUNTER — Ambulatory Visit: Payer: Medicaid Other

## 2023-08-08 DIAGNOSIS — M25512 Pain in left shoulder: Secondary | ICD-10-CM | POA: Diagnosis not present

## 2023-08-08 DIAGNOSIS — G8929 Other chronic pain: Secondary | ICD-10-CM

## 2023-08-08 DIAGNOSIS — M6281 Muscle weakness (generalized): Secondary | ICD-10-CM

## 2023-08-08 DIAGNOSIS — M25612 Stiffness of left shoulder, not elsewhere classified: Secondary | ICD-10-CM

## 2023-08-08 NOTE — Therapy (Unsigned)
OUTPATIENT PHYSICAL THERAPY SHOULDER TREATMENT    Patient Name: Carolyn Sampson MRN: [id number] DOB:27-Apr-1961, 62 y.o., female Today's Date: 08/09/2023  END OF SESSION:  PT End of Session - 08/08/23 1109     Visit Number 8    Number of Visits 17    Date for PT Re-Evaluation 08/27/23    PT Start Time 1129    PT Stop Time 1207    PT Time Calculation (min) 38 min    Activity Tolerance Patient limited by pain    Behavior During Therapy [facility] for tasks assessed/performed             Past Medical History:  Diagnosis Date   Fatty liver    Pre-diabetes    Past Surgical History:  Procedure Laterality Date   ABDOMINAL HYSTERECTOMY     APPENDECTOMY     CESAREAN SECTION     CESAREAN SECTION     COLONOSCOPY WITH PROPOFOL N/A 06/02/2021   Procedure: COLONOSCOPY WITH PROPOFOL;  Surgeon: Toney Reil, MD;  Location: [facility];  Service: Gastroenterology;  Laterality: N/A;  SPANISH INTERPRETER   LAPAROSCOPIC HYSTERECTOMY     LAPAROSCOPIC OOPHERECTOMY Right    SHOULDER ARTHROSCOPY WITH ROTATOR CUFF REPAIR AND SUBACROMIAL DECOMPRESSION Left 08/22/2021   Procedure: SHOULDER ARTHROSCOPY WITH ROTATOR CUFF REPAIR AND SUBACROMIAL DECOMPRESSION;  Surgeon: Jones Broom, MD;  Location: [facility];  Service: Orthopedics;  Laterality: Left;   Patient Active Problem List   Diagnosis Date Noted   Screen for colon cancer    Muscle cramps 05/28/2018   Pre-diabetes 05/28/2018   Fatty liver 05/28/2018   Hair loss 05/28/2018   Sciatica 11/01/2015   S/P right oophorectomy 11/01/2015   S/P hysterectomy 11/01/2015    PCP: N/A  REFERRING PROVIDER: Jones Broom, MD  REFERRING DIAG:  M25.519 (ICD-10-CM) - Pain in unspecified shoulder    THERAPY DIAG:  Chronic left shoulder pain  Stiffness of left shoulder, not elsewhere classified  Muscle weakness (generalized)  Acute pain of left shoulder  Rationale for Evaluation and Treatment: Rehabilitation  ONSET  DATE: 07/17/2020  SUBJECTIVE:                                                                                                                                                                                      SUBJECTIVE STATEMENT: Pt reports no pain in the L shoulder at today's session. She reports continuing to use pain pills to help her sleep at night. No notable changes since last session.   Hand dominance: Right  PERTINENT HISTORY: Pt is a 62 y/o F w/ chronic bilat shoulder pain LUE> RUE. Pt s/p L shoulder arthroscopy w/ RTC  repair and subacromial decompression in November, 2022. Pt's has had ongoing pain since this surgery and has had multiple bouts of physical therapy. Her previous physical therapy being in July, 2024 was d/c to have further orthopedic specialist treatment of the L shoulder. Pt received a cortisone injection in her L shoulder which decreased her pain mildly, and received a new referral to continue physical therapy. Pt reports increased R shoulder pain 2/2 to overuse to compensate from L shoulder pain/ weakness. Pt reports difficulty with carrying objects and lifting with her left arm. Pt's sx are relieved by ceasing movement, icing the L shoulder and mild relief from corticosteroids injections. Pain at best is 4-5/10 and pain at worst is a 7/10. Pt notes mild N/T traveling down the L arm. Along with, difficulty sleeping on her left side, increasing her pain and must have a pillow under her arm.   PAIN:  Are you having pain? Yes: NPRS scale: 0/10 Pain location: L lateral and anterior shoulder  Pain description: Sharp, stabbing Aggravating factors: Holding something, cooking, lifting arm up  Relieving factors: Steroid injection, ice 4-5/10 NPRS pain at best  PRECAUTIONS: None  RED FLAGS: None   WEIGHT BEARING RESTRICTIONS: No  FALLS:  Has patient fallen in last 6 months? No  OCCUPATION: Unemployed 2/2 L shoulder pain   PLOF: Independent  PATIENT GOALS: To  reduce her pain.   NEXT MD VISIT:   OBJECTIVE:  Note: Objective measures were completed at Evaluation unless otherwise noted.  DIAGNOSTIC FINDINGS:  03/18/2022- IMPRESSION:  Status post left shoulder surgery 08/2021. Left shoulder pain and weakness. Compared to 07/12/2021:   1. Interval anterior supraspinatus tendon repair. The prior high-grade partial-thickness tear is no longer seen. There is moderate to high-grade anterior supraspinatus chronic tendinosis. 2. Moderate anterior infraspinatus and mild superior subscapularis chronic tendinosis. 3. Moderate proximal long head of the biceps tendinosis. 4. Likely interval partial distal clavicle excision and mild acromioplasty. 5. Mild-to-moderate subacromial/subdeltoid bursitis, similar to prior.  PATIENT SURVEYS:  FOTO 38/54    COGNITION: Overall cognitive status: Within functional limits for tasks assessed     SENSATION: UQNS: mild decrease in sensation at C2-C3 and C4-C5 dermatomal pattern   POSTURE: Rounded shoulders noted  UPPER EXTREMITY ROM:  Cervical AROM:       Lateral flexion L- 35deg R-45 deg*     Rotation L 50 deg R 60 deg*    Extension 20 deg*    Flexion 50 deg   Active ROM Right eval Left eval  Shoulder flexion WFL 105*  Shoulder extension    Shoulder abduction WFL* 91*  Shoulder adduction    Shoulder internal rotation L1 L buttock/65 deg*  Shoulder external rotation T7* C2/35 deg*  Elbow flexion    Elbow extension    Wrist flexion    Wrist extension    Wrist ulnar deviation    Wrist radial deviation    Wrist pronation    Wrist supination    (Blank rows = not tested)  PROM: L IR: 75 deg* L ER: 55 deg*  *denotes pain with movement  PROM: R Shoulder- Flexion and Abduction WFL w/ pain at end range PROM: L Shoulder- Flexion and Abduction limited to 90 degrees w/ pain throughout the motion and pain limiting further assessment.   UPPER EXTREMITY MMT:  MMT Right eval Left eval  Shoulder  flexion 3+* 2-*  Shoulder extension    Shoulder abduction 3* 2-*  Shoulder adduction    Shoulder internal rotation 4- 2-*  Shoulder external rotation 4- 3+*  Middle trapezius    Lower trapezius    Elbow flexion    Elbow extension    Wrist flexion    Wrist extension    Wrist ulnar deviation    Wrist radial deviation    Wrist pronation    Wrist supination    Grip strength (lbs)    (Blank rows = not tested)  *denotes pain with movement  SHOULDER SPECIAL TESTS: Not performed 2/2 to increased irritability of pt's sx.   JOINT MOBILITY TESTING:   No significant hypomobility noted between R shoulder and L shoulder joint. L shoulder noting increased pain with muscle guarding. AP/PA and inferior bilaterally  PALPATION:  Anterior and lateral portion of left shoulder TTP    TODAY'S TREATMENT: DATE: 08/08/23  UE Ranger L shoulder flexion 2 x1 minute  UE Ranger L shoulder rotation/ flexion large amplitude CW/CCW x30 sec/ each (increased L shoulder pain with activity modified)  UE Ranger L shoulder rotation/ flexion small amplitude CW/CCW x30 sec/ each (increased L shoulder pain with activity modified)  Standing scapular protraction/retraction w/ bilat UE against the wall, shoulder flexion to 90 deg 2 x10  Standing red physioball roll ups on wall w/ bilat Ue's emphasizing thoracic ext at end range 2 x6 Seated L shoulder ER AAROM w/ cane 3 x10  Standing shoulder rows with blue TB 3 x10  Standing L shoulder extension w/ green TB 2 x10  Biofreeze application to anterior, lateral, and posterior L shoulder. Not billed. Used for pain modulation.  PATIENT EDUCATION: Education details: HEP given to pt Person educated: Patient Education method: Medical illustrator Education comprehension: verbalized understanding and returned demonstration  HOME EXERCISE PROGRAM:   Access Code: [email or web address] URL: [email or web address] Date: 07/11/2023 Prepared by: Ronnie Derby  Exercises - Seated Upper Trapezius Stretch  - 1 x daily - 5-7 x weekly - 2 sets - 3 reps - 20 hold - Cervical Extension Stretch  - 1 x daily - 5-7 x weekly - 1 sets - 10 reps - 5 hold - Supine Shoulder Flexion Extension AAROM with Dowel  - 1 x daily - 5-7 x weekly - 2 sets - 12 reps - Supine Shoulder External Rotation with Dowel  - 1 x daily - 5-7 x weekly - 2 sets - 12 reps - Seated Shoulder Abduction Towel Slide at Table Top  - 1 x daily - 5-7 x weekly - 3 sets - 6 reps   ASSESSMENT:  CLINICAL IMPRESSION: Interpreter arriving 15 minutes late to session, limiting start time. Session focused on progressing L shoulder strength and ROM. Pt presenting with decreased irritability of sx, allowing for exercise progressions to occur. Pt reports improved activity tolerance to L shoulder flexion at the UE range and small amplitude rotational movements. However, pt continues to note deficits with L shoulder ER and abduction, experiencing significant pain at end range. Future sessions will continue to focus on improving ER and abduction range, with a goal of reducing pain and improving functional mobility. Pt requesting medical records at end of session, supplied pt with appropriate phone number to complete task. Pt would continue to benefit from skilled PT to address L shoulder strength and pain deficits to improve QoL and return to PLOF.  OBJECTIVE IMPAIRMENTS: decreased mobility, decreased ROM, decreased strength, hypomobility, impaired flexibility, impaired UE functional use, and pain.   ACTIVITY LIMITATIONS: carrying, lifting, sleeping, and bed mobility  PARTICIPATION LIMITATIONS: cleaning, laundry, shopping, and community activity  PERSONAL FACTORS: Age, Past/current  experiences, and Time since onset of injury/illness/exacerbation are also affecting patient's functional outcome.   REHAB POTENTIAL: Fair chronicity of L shoulder pain  CLINICAL DECISION MAKING: Evolving/moderate  complexity  EVALUATION COMPLEXITY: Moderate   GOALS: Goals reviewed with patient? Yes  SHORT TERM GOALS: Target date: 07/30/2023  Pt will be independent with HEP to improve L shoulder strength and decrease pain with functional activities   Baseline:07/02/23: HEP given at today's session.  Goal status: INITIAL  LONG TERM GOALS: Target date: 08/27/2023  Pt will improve FOTO to target score to demonstrate clinically significant improvement in functional mobility.  Baseline: 07/02/23: Deferred to next session 07/09/23: 38/54 Goal status: INITIAL  2.  Pt will decrease pain to <5/10 to demonstrate clinically significant improvement in pain levels with functional activity and ADL's.  Baseline: 07/02/23: 7/10 NPRS Goal status: INITIAL  3.  Pt will improve all L shoulder AROM to WFL/ to match pt's R shoulder to improve functional mobility of the LUE.  Baseline: 07/02/23:  Active ROM Right eval Left eval  Shoulder flexion WFL 105*  Shoulder extension    Shoulder abduction WFL* 91*  Shoulder adduction    Shoulder internal rotation L1 L buttock/65 deg*  Shoulder external rotation T7* C2/35 deg*    Goal status: INITIAL  4.   Pt will self report the ability to sleep on her left arm positioned to pt comfort, w/o increased pain to demonstrate improvements with ability to improve sleep quality.  Baseline: 07/02/23: Unable to sleep on left arm w/o a pillow underneath.  Goal status: INITIAL  PLAN:  PT FREQUENCY: 2x/week  PT DURATION: 8 weeks  PLANNED INTERVENTIONS: Therapeutic exercises, Therapeutic activity, Neuromuscular re-education, Balance training, Gait training, Patient/Family education, Self Care, Joint mobilization, Joint manipulation, Spinal manipulation, Spinal mobilization, Cryotherapy, Moist heat, and Re-evaluation  PLAN FOR NEXT SESSION: Continue to progress L shoulder AROM and strengthening exercises.    Marney Doctor, Student-PT  Delphia Grates. Fairly IV, PT, DPT Physical  Therapist- [place]  [facility]  08/09/23, 8:31 AM

## 2023-08-13 ENCOUNTER — Ambulatory Visit: Payer: Medicaid Other

## 2023-08-13 DIAGNOSIS — M25612 Stiffness of left shoulder, not elsewhere classified: Secondary | ICD-10-CM

## 2023-08-13 DIAGNOSIS — G8929 Other chronic pain: Secondary | ICD-10-CM

## 2023-08-13 DIAGNOSIS — M25512 Pain in left shoulder: Secondary | ICD-10-CM | POA: Diagnosis not present

## 2023-08-13 DIAGNOSIS — M6281 Muscle weakness (generalized): Secondary | ICD-10-CM

## 2023-08-13 NOTE — Therapy (Signed)
OUTPATIENT PHYSICAL THERAPY SHOULDER TREATMENT    Patient Name: Carolyn Sampson MRN: [id number] DOB:07/19/1961, 62 y.o., female Today's Date: 08/13/2023  END OF SESSION:  PT End of Session - 08/13/23 1519     Visit Number 9    Number of Visits 17    Date for PT Re-Evaluation 08/27/23    PT Start Time 1516    PT Stop Time 1600    PT Time Calculation (min) 44 min    Activity Tolerance Patient limited by pain    Behavior During Therapy [facility] for tasks assessed/performed             Past Medical History:  Diagnosis Date   Fatty liver    Pre-diabetes    Past Surgical History:  Procedure Laterality Date   ABDOMINAL HYSTERECTOMY     APPENDECTOMY     CESAREAN SECTION     CESAREAN SECTION     COLONOSCOPY WITH PROPOFOL N/A 06/02/2021   Procedure: COLONOSCOPY WITH PROPOFOL;  Surgeon: Toney Reil, MD;  Location: [facility];  Service: Gastroenterology;  Laterality: N/A;  SPANISH INTERPRETER   LAPAROSCOPIC HYSTERECTOMY     LAPAROSCOPIC OOPHERECTOMY Right    SHOULDER ARTHROSCOPY WITH ROTATOR CUFF REPAIR AND SUBACROMIAL DECOMPRESSION Left 08/22/2021   Procedure: SHOULDER ARTHROSCOPY WITH ROTATOR CUFF REPAIR AND SUBACROMIAL DECOMPRESSION;  Surgeon: Jones Broom, MD;  Location: [facility];  Service: Orthopedics;  Laterality: Left;   Patient Active Problem List   Diagnosis Date Noted   Screen for colon cancer    Muscle cramps 05/28/2018   Pre-diabetes 05/28/2018   Fatty liver 05/28/2018   Hair loss 05/28/2018   Sciatica 11/01/2015   S/P right oophorectomy 11/01/2015   S/P hysterectomy 11/01/2015    PCP: N/A  REFERRING PROVIDER: Jones Broom, MD  REFERRING DIAG:  M25.519 (ICD-10-CM) - Pain in unspecified shoulder    THERAPY DIAG:  Chronic left shoulder pain  Stiffness of left shoulder, not elsewhere classified  Muscle weakness (generalized)  Acute pain of left shoulder  Rationale for Evaluation and Treatment: Rehabilitation  ONSET  DATE: 07/17/2020  SUBJECTIVE:                                                                                                                                                                                      SUBJECTIVE STATEMENT: Pt reports no pain in the L shoulder at today's session. Reports her worst pain at night is 6.5/10 which is an improvement.  Hand dominance: Right  PERTINENT HISTORY: Pt is a 62 y/o F w/ chronic bilat shoulder pain LUE> RUE. Pt s/p L shoulder arthroscopy w/ RTC repair and subacromial decompression in November, 2022. Pt's  has had ongoing pain since this surgery and has had multiple bouts of physical therapy. Her previous physical therapy being in July, 2024 was d/c to have further orthopedic specialist treatment of the L shoulder. Pt received a cortisone injection in her L shoulder which decreased her pain mildly, and received a new referral to continue physical therapy. Pt reports increased R shoulder pain 2/2 to overuse to compensate from L shoulder pain/ weakness. Pt reports difficulty with carrying objects and lifting with her left arm. Pt's sx are relieved by ceasing movement, icing the L shoulder and mild relief from corticosteroids injections. Pain at best is 4-5/10 and pain at worst is a 7/10. Pt notes mild N/T traveling down the L arm. Along with, difficulty sleeping on her left side, increasing her pain and must have a pillow under her arm.   PAIN:  Are you having pain? Yes: NPRS scale: 0/10 Pain location: L lateral and anterior shoulder  Pain description: Sharp, stabbing Aggravating factors: Holding something, cooking, lifting arm up  Relieving factors: Steroid injection, ice 4-5/10 NPRS pain at best  PRECAUTIONS: None  RED FLAGS: None   WEIGHT BEARING RESTRICTIONS: No  FALLS:  Has patient fallen in last 6 months? No  OCCUPATION: Unemployed 2/2 L shoulder pain   PLOF: Independent  PATIENT GOALS: To reduce her pain.   NEXT MD VISIT:    OBJECTIVE:  Note: Objective measures were completed at Evaluation unless otherwise noted.  DIAGNOSTIC FINDINGS:  03/18/2022- IMPRESSION:  Status post left shoulder surgery 08/2021. Left shoulder pain and weakness. Compared to 07/12/2021:   1. Interval anterior supraspinatus tendon repair. The prior high-grade partial-thickness tear is no longer seen. There is moderate to high-grade anterior supraspinatus chronic tendinosis. 2. Moderate anterior infraspinatus and mild superior subscapularis chronic tendinosis. 3. Moderate proximal long head of the biceps tendinosis. 4. Likely interval partial distal clavicle excision and mild acromioplasty. 5. Mild-to-moderate subacromial/subdeltoid bursitis, similar to prior.  PATIENT SURVEYS:  FOTO 38/54    COGNITION: Overall cognitive status: Within functional limits for tasks assessed     SENSATION: UQNS: mild decrease in sensation at C2-C3 and C4-C5 dermatomal pattern   POSTURE: Rounded shoulders noted  UPPER EXTREMITY ROM:  Cervical AROM:       Lateral flexion L- 35 deg R-45 deg*     Rotation L 50 deg R 60 deg*    Extension 20 deg*    Flexion 50 deg   Active ROM Right eval Left eval  Shoulder flexion WFL 105*  Shoulder extension    Shoulder abduction WFL* 91*  Shoulder adduction    Shoulder internal rotation L1 L buttock/65 deg*  Shoulder external rotation T7* C2/35 deg*  Elbow flexion    Elbow extension    Wrist flexion    Wrist extension    Wrist ulnar deviation    Wrist radial deviation    Wrist pronation    Wrist supination    (Blank rows = not tested)  PROM: L IR: 75 deg* L ER: 55 deg*  *denotes pain with movement  PROM: R Shoulder- Flexion and Abduction WFL w/ pain at end range PROM: L Shoulder- Flexion and Abduction limited to 90 degrees w/ pain throughout the motion and pain limiting further assessment.   UPPER EXTREMITY MMT:  MMT Right eval Left eval  Shoulder flexion 3+* 2-*  Shoulder extension     Shoulder abduction 3* 2-*  Shoulder adduction    Shoulder internal rotation 4- 2-*  Shoulder external rotation 4- 3+*  Middle  trapezius    Lower trapezius    Elbow flexion    Elbow extension    Wrist flexion    Wrist extension    Wrist ulnar deviation    Wrist radial deviation    Wrist pronation    Wrist supination    Grip strength (lbs)    (Blank rows = not tested)  *denotes pain with movement  SHOULDER SPECIAL TESTS: Not performed 2/2 to increased irritability of pt's sx.   JOINT MOBILITY TESTING:   No significant hypomobility noted between R shoulder and L shoulder joint. L shoulder noting increased pain with muscle guarding. AP/PA and inferior bilaterally  PALPATION:  Anterior and lateral portion of left shoulder TTP    TODAY'S TREATMENT: DATE: 08/13/23  BUE 2 min fwd/70min bkwd for UE warm up L1  UE Ranger L shoulder flexion 2 x1 minute  UE Ranger L shoulder rotation/ flexion small amplitude CW/CCW 2x30 sec/ each (increased L shoulder pain with activity modified).  Long discussion with patient on believed thought processes of L shoulder pain and POC. Possible need for f/u with orthopedic.    Seated L shoulder ER AAROM w/ cane 2x10, 3# AW attached   Seated L shoulder flexion AAROM with 1# with PVC pipe: x12  Standing bicep curls with PVC pipe and 5# AW's: 2x6    PATIENT EDUCATION: Education details: HEP given to pt Person educated: Patient Education method: Medical illustrator Education comprehension: verbalized understanding and returned demonstration  HOME EXERCISE PROGRAM:   Access Code: [email or web address] URL: [email or web address] Date: 07/11/2023 Prepared by: Ronnie Derby  Exercises - Seated Upper Trapezius Stretch  - 1 x daily - 5-7 x weekly - 2 sets - 3 reps - 20 hold - Cervical Extension Stretch  - 1 x daily - 5-7 x weekly - 1 sets - 10 reps - 5 hold - Supine Shoulder Flexion Extension AAROM with Dowel  - 1 x daily - 5-7 x  weekly - 2 sets - 12 reps - Supine Shoulder External Rotation with Dowel  - 1 x daily - 5-7 x weekly - 2 sets - 12 reps - Seated Shoulder Abduction Towel Slide at Table Top  - 1 x daily - 5-7 x weekly - 3 sets - 6 reps   ASSESSMENT:  CLINICAL IMPRESSION: Pt arriving with good tolerance from last session. Pt continues to have some pain with L shoulder ER and elevation requiring AAROM. Pt is able to tolerate some resistance but with resistance can not complete normal levels of AROM compared to no resistance. Long discussion in session on likely pain contributors and POC to improve pt's symptoms and possible plan if PT fails. Pt understanding of conversation and stating she feels her pain is improved but remains limited above 90 degrees with ADL's and overhead tasks. Pt would continue to benefit from skilled PT to address L shoulder strength and pain deficits to improve QoL and return to PLOF.  OBJECTIVE IMPAIRMENTS: decreased mobility, decreased ROM, decreased strength, hypomobility, impaired flexibility, impaired UE functional use, and pain.   ACTIVITY LIMITATIONS: carrying, lifting, sleeping, and bed mobility  PARTICIPATION LIMITATIONS: cleaning, laundry, shopping, and community activity  PERSONAL FACTORS: Age, Past/current experiences, and Time since onset of injury/illness/exacerbation are also affecting patient's functional outcome.   REHAB POTENTIAL: Fair chronicity of L shoulder pain  CLINICAL DECISION MAKING: Evolving/moderate complexity  EVALUATION COMPLEXITY: Moderate   GOALS: Goals reviewed with patient? Yes  SHORT TERM GOALS: Target date: 07/30/2023  Pt will be independent with HEP to  improve L shoulder strength and decrease pain with functional activities   Baseline:07/02/23: HEP given at today's session.  Goal status: INITIAL  LONG TERM GOALS: Target date: 08/27/2023  Pt will improve FOTO to target score to demonstrate clinically significant improvement in functional  mobility.  Baseline: 07/02/23: Deferred to next session 07/09/23: 38/54 Goal status: INITIAL  2.  Pt will decrease pain to <5/10 to demonstrate clinically significant improvement in pain levels with functional activity and ADL's.  Baseline: 07/02/23: 7/10 NPRS Goal status: INITIAL  3.  Pt will improve all L shoulder AROM to WFL/ to match pt's R shoulder to improve functional mobility of the LUE.  Baseline: 07/02/23:  Active ROM Right eval Left eval  Shoulder flexion WFL 105*  Shoulder extension    Shoulder abduction WFL* 91*  Shoulder adduction    Shoulder internal rotation L1 L buttock/65 deg*  Shoulder external rotation T7* C2/35 deg*    Goal status: INITIAL  4.   Pt will self report the ability to sleep on her left arm positioned to pt comfort, w/o increased pain to demonstrate improvements with ability to improve sleep quality.  Baseline: 07/02/23: Unable to sleep on left arm w/o a pillow underneath.  Goal status: INITIAL  PLAN:  PT FREQUENCY: 2x/week  PT DURATION: 8 weeks  PLANNED INTERVENTIONS: Therapeutic exercises, Therapeutic activity, Neuromuscular re-education, Balance training, Gait training, Patient/Family education, Self Care, Joint mobilization, Joint manipulation, Spinal manipulation, Spinal mobilization, Cryotherapy, Moist heat, and Re-evaluation  PLAN FOR NEXT SESSION: Continue to progress L shoulder AROM and strengthening exercises.    Marney Doctor, Student-PT  Delphia Grates. Fairly IV, PT, DPT Physical Therapist- [facility]  08/13/23, 4:38 PM

## 2023-08-16 ENCOUNTER — Ambulatory Visit: Payer: Medicaid Other

## 2023-08-16 DIAGNOSIS — M6281 Muscle weakness (generalized): Secondary | ICD-10-CM

## 2023-08-16 DIAGNOSIS — M25512 Pain in left shoulder: Secondary | ICD-10-CM | POA: Diagnosis not present

## 2023-08-16 DIAGNOSIS — G8929 Other chronic pain: Secondary | ICD-10-CM

## 2023-08-16 DIAGNOSIS — M25612 Stiffness of left shoulder, not elsewhere classified: Secondary | ICD-10-CM

## 2023-08-16 NOTE — Therapy (Addendum)
OUTPATIENT PHYSICAL THERAPY SHOULDER TREATMENT/ PROGRESS NOTE Dates: 07/02/23- 08/16/23   Patient Name: Carolyn Sampson MRN: [id number] DOB:1961-02-17, 62 y.o., female Today's Date: 08/16/2023  END OF SESSION:  PT End of Session - 08/16/23 1557     Visit Number 10    Number of Visits 17    Date for PT Re-Evaluation 08/27/23    PT Start Time 1600    PT Stop Time 1642    PT Time Calculation (min) 42 min    Activity Tolerance Patient limited by pain    Behavior During Therapy [facility] for tasks assessed/performed             Past Medical History:  Diagnosis Date   Fatty liver    Pre-diabetes    Past Surgical History:  Procedure Laterality Date   ABDOMINAL HYSTERECTOMY     APPENDECTOMY     CESAREAN SECTION     CESAREAN SECTION     COLONOSCOPY WITH PROPOFOL N/A 06/02/2021   Procedure: COLONOSCOPY WITH PROPOFOL;  Surgeon: Toney Reil, MD;  Location: [facility];  Service: Gastroenterology;  Laterality: N/A;  SPANISH INTERPRETER   LAPAROSCOPIC HYSTERECTOMY     LAPAROSCOPIC OOPHERECTOMY Right    SHOULDER ARTHROSCOPY WITH ROTATOR CUFF REPAIR AND SUBACROMIAL DECOMPRESSION Left 08/22/2021   Procedure: SHOULDER ARTHROSCOPY WITH ROTATOR CUFF REPAIR AND SUBACROMIAL DECOMPRESSION;  Surgeon: Jones Broom, MD;  Location: [facility];  Service: Orthopedics;  Laterality: Left;   Patient Active Problem List   Diagnosis Date Noted   Screen for colon cancer    Muscle cramps 05/28/2018   Pre-diabetes 05/28/2018   Fatty liver 05/28/2018   Hair loss 05/28/2018   Sciatica 11/01/2015   S/P right oophorectomy 11/01/2015   S/P hysterectomy 11/01/2015    PCP: N/A  REFERRING PROVIDER: Jones Broom, MD  REFERRING DIAG:  M25.519 (ICD-10-CM) - Pain in unspecified shoulder    THERAPY DIAG:  Chronic left shoulder pain  Stiffness of left shoulder, not elsewhere classified  Muscle weakness (generalized)  Rationale for Evaluation and Treatment:  Rehabilitation  ONSET DATE: 07/17/2020  SUBJECTIVE:                                                                                                                                                                                      SUBJECTIVE STATEMENT: Pt reports 1/10NPS in the L shoulder at today's session. Pt reports having increased pain following last session, however that has subsided since then.  Hand dominance: Right  PERTINENT HISTORY: Pt is a 62 y/o F w/ chronic bilat shoulder pain LUE> RUE. Pt s/p L shoulder arthroscopy w/ RTC repair and subacromial decompression in November, 2022. Pt's has  had ongoing pain since this surgery and has had multiple bouts of physical therapy. Her previous physical therapy being in July, 2024 was d/c to have further orthopedic specialist treatment of the L shoulder. Pt received a cortisone injection in her L shoulder which decreased her pain mildly, and received a new referral to continue physical therapy. Pt reports increased R shoulder pain 2/2 to overuse to compensate from L shoulder pain/ weakness. Pt reports difficulty with carrying objects and lifting with her left arm. Pt's sx are relieved by ceasing movement, icing the L shoulder and mild relief from corticosteroids injections. Pain at best is 4-5/10 and pain at worst is a 7/10. Pt notes mild N/T traveling down the L arm. Along with, difficulty sleeping on her left side, increasing her pain and must have a pillow under her arm.   PAIN:  Are you having pain? Yes: NPRS scale: 1/10 Pain location: L lateral and anterior shoulder  Pain description: Sharp, stabbing Aggravating factors: Holding something, cooking, lifting arm up  Relieving factors: Steroid injection, ice 4-5/10 NPRS pain at best  PRECAUTIONS: None  RED FLAGS: None   WEIGHT BEARING RESTRICTIONS: No  FALLS:  Has patient fallen in last 6 months? No  OCCUPATION: Unemployed 2/2 L shoulder pain   PLOF: Independent  PATIENT  GOALS: To reduce her pain.   NEXT MD VISIT:   OBJECTIVE:  Note: Objective measures were completed at Evaluation unless otherwise noted.  DIAGNOSTIC FINDINGS:  03/18/2022- IMPRESSION:  Status post left shoulder surgery 08/2021. Left shoulder pain and weakness. Compared to 07/12/2021:   1. Interval anterior supraspinatus tendon repair. The prior high-grade partial-thickness tear is no longer seen. There is moderate to high-grade anterior supraspinatus chronic tendinosis. 2. Moderate anterior infraspinatus and mild superior subscapularis chronic tendinosis. 3. Moderate proximal long head of the biceps tendinosis. 4. Likely interval partial distal clavicle excision and mild acromioplasty. 5. Mild-to-moderate subacromial/subdeltoid bursitis, similar to prior.  PATIENT SURVEYS:  FOTO 38/54    COGNITION: Overall cognitive status: Within functional limits for tasks assessed     SENSATION: UQNS: mild decrease in sensation at C2-C3 and C4-C5 dermatomal pattern   POSTURE: Rounded shoulders noted  UPPER EXTREMITY ROM:  Cervical AROM:       Lateral flexion L- 35 deg R-45 deg*     Rotation L 50 deg R 60 deg*    Extension 20 deg*    Flexion 50 deg   Active ROM Right eval Left eval  Shoulder flexion WFL 105*  Shoulder extension    Shoulder abduction WFL* 91*  Shoulder adduction    Shoulder internal rotation L1 L buttock/65 deg*  Shoulder external rotation T7* C2/35 deg*  Elbow flexion    Elbow extension    Wrist flexion    Wrist extension    Wrist ulnar deviation    Wrist radial deviation    Wrist pronation    Wrist supination    (Blank rows = not tested)  PROM: L IR: 75 deg* L ER: 55 deg*  *denotes pain with movement  PROM: R Shoulder- Flexion and Abduction WFL w/ pain at end range PROM: L Shoulder- Flexion and Abduction limited to 90 degrees w/ pain throughout the motion and pain limiting further assessment.   UPPER EXTREMITY MMT:  MMT Right eval Left eval   Shoulder flexion 3+* 2-*  Shoulder extension    Shoulder abduction 3* 2-*  Shoulder adduction    Shoulder internal rotation 4- 2-*  Shoulder external rotation 4- 3+*  Middle trapezius  Lower trapezius    Elbow flexion    Elbow extension    Wrist flexion    Wrist extension    Wrist ulnar deviation    Wrist radial deviation    Wrist pronation    Wrist supination    Grip strength (lbs)    (Blank rows = not tested)  *denotes pain with movement  SHOULDER SPECIAL TESTS: Not performed 2/2 to increased irritability of pt's sx.   JOINT MOBILITY TESTING:   No significant hypomobility noted between R shoulder and L shoulder joint. L shoulder noting increased pain with muscle guarding. AP/PA and inferior bilaterally  PALPATION:  Anterior and lateral portion of left shoulder TTP    TODAY'S TREATMENT: DATE: 08/16/23  Beginning of session spent reviewing goals a pt requiring progress note. See clinical impression and goals section for details.    UE Ranger L shoulder flexion 2 x1 minute Standing L shoulder abduction AAROM w/ cane 2 x1 minute Inclined L shoulder AAROM flexion utilizing RUE to facilitate movement followed by eccentric lower of LUE only x8 Seated L shoulder ER AAROM w/ cane 2 x1 minute Standing bilat shoulder extension behind back w/ cane x5 (exercise d/c 2/2 pt tolerance)   PATIENT EDUCATION: Education details: HEP given to pt Person educated: Patient Education method: Medical illustrator Education comprehension: verbalized understanding and returned demonstration  HOME EXERCISE PROGRAM:   Access Code: [email or web address] URL: [email or web address] Date: 07/11/2023 Prepared by: Ronnie Derby  Exercises - Seated Upper Trapezius Stretch  - 1 x daily - 5-7 x weekly - 2 sets - 3 reps - 20 hold - Cervical Extension Stretch  - 1 x daily - 5-7 x weekly - 1 sets - 10 reps - 5 hold - Supine Shoulder Flexion Extension AAROM with Dowel  - 1 x daily - 5-7  x weekly - 2 sets - 12 reps - Supine Shoulder External Rotation with Dowel  - 1 x daily - 5-7 x weekly - 2 sets - 12 reps - Seated Shoulder Abduction Towel Slide at Table Top  - 1 x daily - 5-7 x weekly - 3 sets - 6 reps   ASSESSMENT:  CLINICAL IMPRESSION: Session focused on reviewing pt's STG and LTG's as pt requires a progress note following her 10th visit. Pt reports achieving 1/5 of her goals, including full compliance with her HEP. Although not yet met, pt is making progress toward her target FOTO score and L shoulder AROM goals (see below). Pt continues to experience L shoulder pain, which remains a limiting factor in completing ADLs, functional mobility, and sleeping on her L side. Despite this, pt demonstrates steady progress toward her functional goals and is able to tolerate exercises with some discomfort. Pt and PT agree that continued skilled PT interventions are necessary to address remaining deficits and further progress toward achieving her goals and returning to PLOF.  OBJECTIVE IMPAIRMENTS: decreased mobility, decreased ROM, decreased strength, hypomobility, impaired flexibility, impaired UE functional use, and pain.   ACTIVITY LIMITATIONS: carrying, lifting, sleeping, and bed mobility  PARTICIPATION LIMITATIONS: cleaning, laundry, shopping, and community activity  PERSONAL FACTORS: Age, Past/current experiences, and Time since onset of injury/illness/exacerbation are also affecting patient's functional outcome.   REHAB POTENTIAL: Fair chronicity of L shoulder pain  CLINICAL DECISION MAKING: Evolving/moderate complexity  EVALUATION COMPLEXITY: Moderate   GOALS: Goals reviewed with patient? Yes  SHORT TERM GOALS: Target date: 07/30/2023  Pt will be independent with HEP to improve L shoulder strength and decrease pain with functional activities  Baseline:07/02/23: HEP given at today's session. 08/16/23: HEP compliant.  Goal status: MET  LONG TERM GOALS: Target date:  08/27/2023  Pt will improve FOTO to target score to demonstrate clinically significant improvement in functional mobility.  Baseline: 07/02/23: Deferred to next session 07/09/23: 38/54 08/16/23: 41/54 Goal status: ONGOING  2.  Pt will decrease pain to <5/10 to demonstrate clinically significant improvement in pain levels with functional activity and ADL's.  Baseline: 07/02/23: 7/10 NPRS 08/16/23: 7.5/8 NPRS Goal status: ONGOING  3.  Pt will improve all L shoulder AROM to WFL/ to match pt's R shoulder to improve functional mobility of the LUE.  Baseline: 07/02/23:  Active ROM Right eval Left eval Left 08/16/23 (measured in seated)  Shoulder flexion WFL 105* deg 105* deg  Shoulder extension     Shoulder abduction WFL* 91* 90* deg  Shoulder adduction     Shoulder internal rotation L1 L buttock/65 deg* L buttock/75 deg*   Shoulder external rotation T7* C2/35 deg* C4/ 60 deg*  *pain at end range   Goal status: ONGOING  4.   Pt will self report the ability to sleep on her left arm positioned to pt comfort, w/o increased pain to demonstrate improvements with ability to improve sleep quality.  Baseline: 07/02/23: Unable to sleep on left arm w/o a pillow underneath. 08/16/23: Pt unable to sleep on the L arm w/o a pillow underneath it Goal status: ONGOING  PLAN:  PT FREQUENCY: 2x/week  PT DURATION: 8 weeks  PLANNED INTERVENTIONS: Therapeutic exercises, Therapeutic activity, Neuromuscular re-education, Balance training, Gait training, Patient/Family education, Self Care, Joint mobilization, Joint manipulation, Spinal manipulation, Spinal mobilization, Cryotherapy, Moist heat, and Re-evaluation  PLAN FOR NEXT SESSION: Continue to progress L shoulder AROM and strengthening exercises. (Emphasize ext, abduction and flexion AROM of L shoulder)   Marney Doctor, Student-PT  Delphia Grates. Fairly IV, PT, DPT Physical Therapist- [facility]  08/16/23, 8:08 PM

## 2023-08-20 ENCOUNTER — Ambulatory Visit: Payer: Medicaid Other

## 2023-08-20 DIAGNOSIS — M25512 Pain in left shoulder: Secondary | ICD-10-CM | POA: Diagnosis not present

## 2023-08-20 DIAGNOSIS — G8929 Other chronic pain: Secondary | ICD-10-CM

## 2023-08-20 DIAGNOSIS — M25612 Stiffness of left shoulder, not elsewhere classified: Secondary | ICD-10-CM

## 2023-08-20 DIAGNOSIS — M6281 Muscle weakness (generalized): Secondary | ICD-10-CM

## 2023-08-20 NOTE — Therapy (Signed)
OUTPATIENT PHYSICAL THERAPY SHOULDER TREATMENT   Patient Name: Carolyn Sampson MRN: [id number] DOB:1961-03-06, 62 y.o., female Today's Date: 08/20/2023  END OF SESSION:  PT End of Session - 08/20/23 1520     Visit Number 11    Number of Visits 17    Date for PT Re-Evaluation 08/27/23    PT Start Time 1515    PT Stop Time 1600    PT Time Calculation (min) 45 min    Activity Tolerance Patient limited by pain;Patient tolerated treatment well    Behavior During Therapy [facility] for tasks assessed/performed             Past Medical History:  Diagnosis Date   Fatty liver    Pre-diabetes    Past Surgical History:  Procedure Laterality Date   ABDOMINAL HYSTERECTOMY     APPENDECTOMY     CESAREAN SECTION     CESAREAN SECTION     COLONOSCOPY WITH PROPOFOL N/A 06/02/2021   Procedure: COLONOSCOPY WITH PROPOFOL;  Surgeon: Toney Reil, MD;  Location: [facility];  Service: Gastroenterology;  Laterality: N/A;  SPANISH INTERPRETER   LAPAROSCOPIC HYSTERECTOMY     LAPAROSCOPIC OOPHERECTOMY Right    SHOULDER ARTHROSCOPY WITH ROTATOR CUFF REPAIR AND SUBACROMIAL DECOMPRESSION Left 08/22/2021   Procedure: SHOULDER ARTHROSCOPY WITH ROTATOR CUFF REPAIR AND SUBACROMIAL DECOMPRESSION;  Surgeon: Jones Broom, MD;  Location: [facility];  Service: Orthopedics;  Laterality: Left;   Patient Active Problem List   Diagnosis Date Noted   Screen for colon cancer    Muscle cramps 05/28/2018   Pre-diabetes 05/28/2018   Fatty liver 05/28/2018   Hair loss 05/28/2018   Sciatica 11/01/2015   S/P right oophorectomy 11/01/2015   S/P hysterectomy 11/01/2015    PCP: N/A  REFERRING PROVIDER: Jones Broom, MD  REFERRING DIAG:  M25.519 (ICD-10-CM) - Pain in unspecified shoulder    THERAPY DIAG:  Chronic left shoulder pain  Stiffness of left shoulder, not elsewhere classified  Muscle weakness (generalized)  Rationale for Evaluation and Treatment: Rehabilitation  ONSET  DATE: 07/17/2020  SUBJECTIVE:                                                                                                                                                                                      SUBJECTIVE STATEMENT: Pt reports no pain. No new concerns/complaints.   Hand dominance: Right  PERTINENT HISTORY: Pt is a 62 y/o F w/ chronic bilat shoulder pain LUE> RUE. Pt s/p L shoulder arthroscopy w/ RTC repair and subacromial decompression in November, 2022. Pt's has had ongoing pain since this surgery and has had multiple bouts of physical therapy. Her previous physical therapy  being in July, 2024 was d/c to have further orthopedic specialist treatment of the L shoulder. Pt received a cortisone injection in her L shoulder which decreased her pain mildly, and received a new referral to continue physical therapy. Pt reports increased R shoulder pain 2/2 to overuse to compensate from L shoulder pain/ weakness. Pt reports difficulty with carrying objects and lifting with her left arm. Pt's sx are relieved by ceasing movement, icing the L shoulder and mild relief from corticosteroids injections. Pain at best is 4-5/10 and pain at worst is a 7/10. Pt notes mild N/T traveling down the L arm. Along with, difficulty sleeping on her left side, increasing her pain and must have a pillow under her arm.   PAIN:  Are you having pain? Yes: NPRS scale: 1/10 Pain location: L lateral and anterior shoulder  Pain description: Sharp, stabbing Aggravating factors: Holding something, cooking, lifting arm up  Relieving factors: Steroid injection, ice 4-5/10 NPRS pain at best  PRECAUTIONS: None  RED FLAGS: None   WEIGHT BEARING RESTRICTIONS: No  FALLS:  Has patient fallen in last 6 months? No  OCCUPATION: Unemployed 2/2 L shoulder pain   PLOF: Independent  PATIENT GOALS: To reduce her pain.   NEXT MD VISIT:   OBJECTIVE:  Note: Objective measures were completed at Evaluation unless  otherwise noted.  DIAGNOSTIC FINDINGS:  03/18/2022- IMPRESSION:  Status post left shoulder surgery 08/2021. Left shoulder pain and weakness. Compared to 07/12/2021:   1. Interval anterior supraspinatus tendon repair. The prior high-grade partial-thickness tear is no longer seen. There is moderate to high-grade anterior supraspinatus chronic tendinosis. 2. Moderate anterior infraspinatus and mild superior subscapularis chronic tendinosis. 3. Moderate proximal long head of the biceps tendinosis. 4. Likely interval partial distal clavicle excision and mild acromioplasty. 5. Mild-to-moderate subacromial/subdeltoid bursitis, similar to prior.  PATIENT SURVEYS:  FOTO 38/54    COGNITION: Overall cognitive status: Within functional limits for tasks assessed     SENSATION: UQNS: mild decrease in sensation at C2-C3 and C4-C5 dermatomal pattern   POSTURE: Rounded shoulders noted  UPPER EXTREMITY ROM:  Cervical AROM:       Lateral flexion L- 35 deg R-45 deg*     Rotation L 50 deg R 60 deg*    Extension 20 deg*    Flexion 50 deg   Active ROM Right eval Left eval  Shoulder flexion WFL 105*  Shoulder extension    Shoulder abduction WFL* 91*  Shoulder adduction    Shoulder internal rotation L1 L buttock/65 deg*  Shoulder external rotation T7* C2/35 deg*  Elbow flexion    Elbow extension    Wrist flexion    Wrist extension    Wrist ulnar deviation    Wrist radial deviation    Wrist pronation    Wrist supination    (Blank rows = not tested)  PROM: L IR: 75 deg* L ER: 55 deg*  *denotes pain with movement  PROM: R Shoulder- Flexion and Abduction WFL w/ pain at end range PROM: L Shoulder- Flexion and Abduction limited to 90 degrees w/ pain throughout the motion and pain limiting further assessment.   UPPER EXTREMITY MMT:  MMT Right eval Left eval  Shoulder flexion 3+* 2-*  Shoulder extension    Shoulder abduction 3* 2-*  Shoulder adduction    Shoulder internal  rotation 4- 2-*  Shoulder external rotation 4- 3+*  Middle trapezius    Lower trapezius    Elbow flexion    Elbow extension  Wrist flexion    Wrist extension    Wrist ulnar deviation    Wrist radial deviation    Wrist pronation    Wrist supination    Grip strength (lbs)    (Blank rows = not tested)  *denotes pain with movement  SHOULDER SPECIAL TESTS: Not performed 2/2 to increased irritability of pt's sx.   JOINT MOBILITY TESTING:   No significant hypomobility noted between R shoulder and L shoulder joint. L shoulder noting increased pain with muscle guarding. AP/PA and inferior bilaterally  PALPATION:  Anterior and lateral portion of left shoulder TTP    TODAY'S TREATMENT: DATE: 08/20/23  There.ex:  UBE L2, 2 min fwd, 2 min backward for UE warm up  Seated pulleys scaption AAROM: 3x12  Standing bicep curls neutral grip to supinated grip with eccentric portion: 3x6, 2# DB, BUE's.  Inclined L shoulder AAROM flexion utilizing RUE to facilitate movement followed by eccentric lower of LUE only 2x8 Seated, manual resisted shoulder ER on LUE.  PATIENT EDUCATION: Education details: HEP given to pt Person educated: Patient Education method: Medical illustrator Education comprehension: verbalized understanding and returned demonstration  HOME EXERCISE PROGRAM:   Access Code: [email or web address] URL: [email or web address] Date: 07/11/2023 Prepared by: Ronnie Derby  Exercises - Seated Upper Trapezius Stretch  - 1 x daily - 5-7 x weekly - 2 sets - 3 reps - 20 hold - Cervical Extension Stretch  - 1 x daily - 5-7 x weekly - 1 sets - 10 reps - 5 hold - Supine Shoulder Flexion Extension AAROM with Dowel  - 1 x daily - 5-7 x weekly - 2 sets - 12 reps - Supine Shoulder External Rotation with Dowel  - 1 x daily - 5-7 x weekly - 2 sets - 12 reps - Seated Shoulder Abduction Towel Slide at Table Top  - 1 x daily - 5-7 x weekly - 3 sets - 6  reps   ASSESSMENT:  CLINICAL IMPRESSION: Continuing PT POC with focus on trying to progress L shoulder flexion beyond 90 degrees. Pt remains pain limited with elbow flexion, shoulder flexion, and shoulder ER relying on gravity reduced and modified ranges for tolerance for activity with regular seated rest breaks. Pt does subjectively report better ability to perform ADL's with L shoulder at home with reduced pain levels but remains with significant pain beyond that 90 degree mark. Pt and PT agree that continued skilled PT interventions are necessary to address remaining deficits and further progress toward achieving her goals and returning to PLOF.  OBJECTIVE IMPAIRMENTS: decreased mobility, decreased ROM, decreased strength, hypomobility, impaired flexibility, impaired UE functional use, and pain.   ACTIVITY LIMITATIONS: carrying, lifting, sleeping, and bed mobility  PARTICIPATION LIMITATIONS: cleaning, laundry, shopping, and community activity  PERSONAL FACTORS: Age, Past/current experiences, and Time since onset of injury/illness/exacerbation are also affecting patient's functional outcome.   REHAB POTENTIAL: Fair chronicity of L shoulder pain  CLINICAL DECISION MAKING: Evolving/moderate complexity  EVALUATION COMPLEXITY: Moderate   GOALS: Goals reviewed with patient? Yes  SHORT TERM GOALS: Target date: 07/30/2023  Pt will be independent with HEP to improve L shoulder strength and decrease pain with functional activities   Baseline:07/02/23: HEP given at today's session. 08/16/23: HEP compliant.  Goal status: MET  LONG TERM GOALS: Target date: 08/27/2023  Pt will improve FOTO to target score to demonstrate clinically significant improvement in functional mobility.  Baseline: 07/02/23: Deferred to next session 07/09/23: 38/54 08/16/23: 41/54 Goal status: ONGOING  2.  Pt will decrease pain  to <5/10 to demonstrate clinically significant improvement in pain levels with functional  activity and ADL's.  Baseline: 07/02/23: 7/10 NPRS 08/16/23: 7.5/8 NPRS Goal status: ONGOING  3.  Pt will improve all L shoulder AROM to WFL/ to match pt's R shoulder to improve functional mobility of the LUE.  Baseline: 07/02/23:  Active ROM Right eval Left eval Left 08/16/23 (measured in seated)  Shoulder flexion WFL 105* deg 105* deg  Shoulder extension     Shoulder abduction WFL* 91* 90* deg  Shoulder adduction     Shoulder internal rotation L1 L buttock/65 deg* L buttock/75 deg*   Shoulder external rotation T7* C2/35 deg* C4/ 60 deg*  *pain at end range   Goal status: ONGOING  4.   Pt will self report the ability to sleep on her left arm positioned to pt comfort, w/o increased pain to demonstrate improvements with ability to improve sleep quality.  Baseline: 07/02/23: Unable to sleep on left arm w/o a pillow underneath. 08/16/23: Pt unable to sleep on the L arm w/o a pillow underneath it Goal status: ONGOING  PLAN:  PT FREQUENCY: 2x/week  PT DURATION: 8 weeks  PLANNED INTERVENTIONS: Therapeutic exercises, Therapeutic activity, Neuromuscular re-education, Balance training, Gait training, Patient/Family education, Self Care, Joint mobilization, Joint manipulation, Spinal manipulation, Spinal mobilization, Cryotherapy, Moist heat, and Re-evaluation  PLAN FOR NEXT SESSION: Continue to progress L shoulder AROM and strengthening exercises. (Emphasize ext, abduction and flexion AROM of L shoulder)     Delphia Grates. Fairly IV, PT, DPT Physical Therapist- [facility]  08/20/23, 4:14 PM

## 2023-08-23 ENCOUNTER — Ambulatory Visit: Payer: Medicaid Other

## 2023-08-27 ENCOUNTER — Ambulatory Visit: Payer: Medicaid Other

## 2023-08-27 DIAGNOSIS — M25512 Pain in left shoulder: Secondary | ICD-10-CM | POA: Diagnosis not present

## 2023-08-27 DIAGNOSIS — M6281 Muscle weakness (generalized): Secondary | ICD-10-CM

## 2023-08-27 DIAGNOSIS — G8929 Other chronic pain: Secondary | ICD-10-CM

## 2023-08-27 DIAGNOSIS — M25612 Stiffness of left shoulder, not elsewhere classified: Secondary | ICD-10-CM

## 2023-08-27 NOTE — Therapy (Signed)
OUTPATIENT PHYSICAL THERAPY SHOULDER TREATMENT   Patient Name: Carolyn Sampson MRN: [id number] DOB:[birth year], 62 y.o., female Today's Date: 08/27/2023  END OF SESSION:  PT End of Session - 08/27/23 1515     Visit Number 12    Number of Visits 17    Date for PT Re-Evaluation 10/08/23    PT Start Time 1516    PT Stop Time 1559    PT Time Calculation (min) 43 min    Activity Tolerance Patient limited by pain;Patient tolerated treatment well    Behavior During Therapy [facility] for tasks assessed/performed             Past Medical History:  Diagnosis Date   Fatty liver    Pre-diabetes    Past Surgical History:  Procedure Laterality Date   ABDOMINAL HYSTERECTOMY     APPENDECTOMY     CESAREAN SECTION     CESAREAN SECTION     COLONOSCOPY WITH PROPOFOL N/A 06/02/2021   Procedure: COLONOSCOPY WITH PROPOFOL;  Surgeon: Toney Reil, MD;  Location: [facility];  Service: Gastroenterology;  Laterality: N/A;  SPANISH INTERPRETER   LAPAROSCOPIC HYSTERECTOMY     LAPAROSCOPIC OOPHERECTOMY Right    SHOULDER ARTHROSCOPY WITH ROTATOR CUFF REPAIR AND SUBACROMIAL DECOMPRESSION Left 08/22/2021   Procedure: SHOULDER ARTHROSCOPY WITH ROTATOR CUFF REPAIR AND SUBACROMIAL DECOMPRESSION;  Surgeon: Jones Broom, MD;  Location: [facility];  Service: Orthopedics;  Laterality: Left;   Patient Active Problem List   Diagnosis Date Noted   Screen for colon cancer    Muscle cramps 05/28/2018   Pre-diabetes 05/28/2018   Fatty liver 05/28/2018   Hair loss 05/28/2018   Sciatica 11/01/2015   S/P right oophorectomy 11/01/2015   S/P hysterectomy 11/01/2015    PCP: N/A  REFERRING PROVIDER: Jones Broom, MD  REFERRING DIAG:  M25.519 (ICD-10-CM) - Pain in unspecified shoulder    THERAPY DIAG:  Chronic left shoulder pain  Stiffness of left shoulder, not elsewhere classified  Muscle weakness (generalized)  Rationale for Evaluation and Treatment: Rehabilitation  ONSET  DATE: 07/17/2020  SUBJECTIVE:                                                                                                                                                                                      SUBJECTIVE STATEMENT: Pt reports getting up quickly last night hurting her shoulder. Currently 5/10 NPS in L shoulder.   Hand dominance: Right  PERTINENT HISTORY: Pt is a 62 y/o F w/ chronic bilat shoulder pain LUE> RUE. Pt s/p L shoulder arthroscopy w/ RTC repair and subacromial decompression in November, 2022. Pt's has had ongoing pain since this surgery and has had  multiple bouts of physical therapy. Her previous physical therapy being in July, 2024 was d/c to have further orthopedic specialist treatment of the L shoulder. Pt received a cortisone injection in her L shoulder which decreased her pain mildly, and received a new referral to continue physical therapy. Pt reports increased R shoulder pain 2/2 to overuse to compensate from L shoulder pain/ weakness. Pt reports difficulty with carrying objects and lifting with her left arm. Pt's sx are relieved by ceasing movement, icing the L shoulder and mild relief from corticosteroids injections. Pain at best is 4-5/10 and pain at worst is a 7/10. Pt notes mild N/T traveling down the L arm. Along with, difficulty sleeping on her left side, increasing her pain and must have a pillow under her arm.   PAIN:  Are you having pain? Yes: NPRS scale: 1/10 Pain location: L lateral and anterior shoulder  Pain description: Sharp, stabbing Aggravating factors: Holding something, cooking, lifting arm up  Relieving factors: Steroid injection, ice 4-5/10 NPRS pain at best  PRECAUTIONS: None  RED FLAGS: None   WEIGHT BEARING RESTRICTIONS: No  FALLS:  Has patient fallen in last 6 months? No  OCCUPATION: Unemployed 2/2 L shoulder pain   PLOF: Independent  PATIENT GOALS: To reduce her pain.   NEXT MD VISIT:   OBJECTIVE:  Note: Objective  measures were completed at Evaluation unless otherwise noted.  DIAGNOSTIC FINDINGS:  03/18/2022- IMPRESSION:  Status post left shoulder surgery 08/2021. Left shoulder pain and weakness. Compared to 07/12/2021:   1. Interval anterior supraspinatus tendon repair. The prior high-grade partial-thickness tear is no longer seen. There is moderate to high-grade anterior supraspinatus chronic tendinosis. 2. Moderate anterior infraspinatus and mild superior subscapularis chronic tendinosis. 3. Moderate proximal long head of the biceps tendinosis. 4. Likely interval partial distal clavicle excision and mild acromioplasty. 5. Mild-to-moderate subacromial/subdeltoid bursitis, similar to prior.  PATIENT SURVEYS:  FOTO 38/54    COGNITION: Overall cognitive status: Within functional limits for tasks assessed     SENSATION: UQNS: mild decrease in sensation at C2-C3 and C4-C5 dermatomal pattern   POSTURE: Rounded shoulders noted  UPPER EXTREMITY ROM:  Cervical AROM:       Lateral flexion L- 35 deg R-45 deg*     Rotation L 50 deg R 60 deg*    Extension 20 deg*    Flexion 50 deg   Active ROM Right eval Left eval  Shoulder flexion WFL 105*  Shoulder extension    Shoulder abduction WFL* 91*  Shoulder adduction    Shoulder internal rotation L1 L buttock/65 deg*  Shoulder external rotation T7* C2/35 deg*  Elbow flexion    Elbow extension    Wrist flexion    Wrist extension    Wrist ulnar deviation    Wrist radial deviation    Wrist pronation    Wrist supination    (Blank rows = not tested)  PROM: L IR: 75 deg* L ER: 55 deg*  *denotes pain with movement  PROM: R Shoulder- Flexion and Abduction WFL w/ pain at end range PROM: L Shoulder- Flexion and Abduction limited to 90 degrees w/ pain throughout the motion and pain limiting further assessment.   UPPER EXTREMITY MMT:  MMT Right eval Left eval  Shoulder flexion 3+* 2-*  Shoulder extension    Shoulder abduction 3* 2-*   Shoulder adduction    Shoulder internal rotation 4- 2-*  Shoulder external rotation 4- 3+*  Middle trapezius    Lower trapezius    Elbow  flexion    Elbow extension    Wrist flexion    Wrist extension    Wrist ulnar deviation    Wrist radial deviation    Wrist pronation    Wrist supination    Grip strength (lbs)    (Blank rows = not tested)  *denotes pain with movement  SHOULDER SPECIAL TESTS: Not performed 2/2 to increased irritability of pt's sx.   JOINT MOBILITY TESTING:   No significant hypomobility noted between R shoulder and L shoulder joint. L shoulder noting increased pain with muscle guarding. AP/PA and inferior bilaterally  PALPATION:  Anterior and lateral portion of left shoulder TTP    TODAY'S TREATMENT: DATE: 08/27/23  There.ex:   Please see goals section for updates on goals from 08/16/23 where goals were reassessed. See clinical impression for details.  FOTO: 44  Supine shoulder AAROM with 2# AW: 1x12, 1x6   Supine chest press with dowel and 2# AW: 3x8   Standing UE ranger shoulder flexion AAROM: 3x8, 122 degrees   Standing pulleys for shoulder extension/IR LUE: x8. Significant pain so discontinued.  Standing shoulder extension AAROM with dowel: 2x8. Feels better with pronated grip per pt report  Standing bicep curls neutral grip to supinated grip with eccentric portion: 3x6, 3# DB, BUE's.   PATIENT EDUCATION: Education details: HEP given to pt Person educated: Patient Education method: Medical illustrator Education comprehension: verbalized understanding and returned demonstration  HOME EXERCISE PROGRAM:   Access Code: [email or web address] URL: [email or web address] Date: 07/11/2023 Prepared by: Ronnie Derby  Exercises - Seated Upper Trapezius Stretch  - 1 x daily - 5-7 x weekly - 2 sets - 3 reps - 20 hold - Cervical Extension Stretch  - 1 x daily - 5-7 x weekly - 1 sets - 10 reps - 5 hold - Supine Shoulder Flexion  Extension AAROM with Dowel  - 1 x daily - 5-7 x weekly - 2 sets - 12 reps - Supine Shoulder External Rotation with Dowel  - 1 x daily - 5-7 x weekly - 2 sets - 12 reps - Seated Shoulder Abduction Towel Slide at Table Top  - 1 x daily - 5-7 x weekly - 3 sets - 6 reps   ASSESSMENT:  CLINICAL IMPRESSION: Pt at end of POC requiring RECERT. Goals re-assessed 2 visits ago thus will review progress with goals today in clinical impression. Pt has made mild improvements in FOTO score with relatively same shoulder flexion and abduction. However pt has made vast improvements in L shoulder ER/IR AROM. Pt also displaying consistently elevated pain levels primarily with overhead and behind back mobility tasks for ADL completion. Pt also remains limited in sleeping tasks on her L shoulder due to pain levels. PT requesting additional 6 weeks with particular focus on progressing overhead shoulder mobility to improve overhead bathing and dressing tasks. Pt will continue to benefit from skilled PT services to address her L shoulder impairments to maximize return to functional ADL completion.    OBJECTIVE IMPAIRMENTS: decreased mobility, decreased ROM, decreased strength, hypomobility, impaired flexibility, impaired UE functional use, and pain.   ACTIVITY LIMITATIONS: carrying, lifting, sleeping, and bed mobility  PARTICIPATION LIMITATIONS: cleaning, laundry, shopping, and community activity  PERSONAL FACTORS: Age, Past/current experiences, and Time since onset of injury/illness/exacerbation are also affecting patient's functional outcome.   REHAB POTENTIAL: Fair chronicity of L shoulder pain  CLINICAL DECISION MAKING: Evolving/moderate complexity  EVALUATION COMPLEXITY: Moderate   GOALS: Goals reviewed with patient? Yes  SHORT TERM GOALS: Target date: 07/30/2023  Pt will be independent with HEP to improve L shoulder strength and decrease pain with functional activities   Baseline:07/02/23: HEP given at  today's session. 08/16/23: HEP compliant.  Goal status: MET  LONG TERM GOALS: Target date: 10/08/2023  Pt will improve FOTO to target score to demonstrate clinically significant improvement in functional mobility.  Baseline: 07/02/23: Deferred to next session 07/09/23: 38/54 08/16/23: 41/54; 08/27/23: 44/54 Goal status: ONGOING  2.  Pt will decrease pain to <5/10 to demonstrate clinically significant improvement in pain levels with functional activity and ADL's.  Baseline: 07/02/23: 7/10 NPRS 08/16/23: 7.5-8/10 NPRS; 08/27/23: 7-8/10 NPS. Goal status: ONGOING  3.  Pt will improve all L shoulder AROM to WFL/ to match pt's R shoulder to improve functional mobility of the LUE.  Baseline: 07/02/23:  Active ROM Right eval Left eval Left 08/16/23 (measured in seated)  Shoulder flexion WFL 105* deg 105* deg  Shoulder extension     Shoulder abduction WFL* 91* 90* deg  Shoulder adduction     Shoulder internal rotation L1 L buttock/65 deg* L buttock/75 deg*   Shoulder external rotation T7* C2/35 deg* C4/ 60 deg*  *pain at end range   Goal status: ONGOING  4.   Pt will self report the ability to sleep on her left arm positioned to pt comfort, w/o increased pain to demonstrate improvements with ability to improve sleep quality.  Baseline: 07/02/23: Unable to sleep on left arm w/o a pillow underneath. 08/16/23: Pt unable to sleep on the L arm w/o a pillow underneath it; 08/27/23: avoids still due to pain.  Goal status: ONGOING  PLAN:  PT FREQUENCY: 2x/week  PT DURATION: 6 weeks  PLANNED INTERVENTIONS: Therapeutic exercises, Therapeutic activity, Neuromuscular re-education, Balance training, Gait training, Patient/Family education, Self Care, Joint mobilization, Joint manipulation, Spinal manipulation, Spinal mobilization, Cryotherapy, Moist heat, and Re-evaluation  PLAN FOR NEXT SESSION: Continue to progress L shoulder AROM and strengthening exercises. (Emphasize ext, abduction and flexion AROM  of L shoulder)     Delphia Grates. Fairly IV, PT, DPT Physical Therapist- [facility]  08/27/23, 4:46 PM

## 2023-08-29 ENCOUNTER — Ambulatory Visit: Payer: Medicaid Other

## 2023-08-29 DIAGNOSIS — G8929 Other chronic pain: Secondary | ICD-10-CM

## 2023-08-29 DIAGNOSIS — M25512 Pain in left shoulder: Secondary | ICD-10-CM | POA: Diagnosis not present

## 2023-08-29 DIAGNOSIS — M25612 Stiffness of left shoulder, not elsewhere classified: Secondary | ICD-10-CM

## 2023-08-29 DIAGNOSIS — M6281 Muscle weakness (generalized): Secondary | ICD-10-CM

## 2023-08-29 NOTE — Therapy (Signed)
OUTPATIENT PHYSICAL THERAPY SHOULDER TREATMENT   Patient Name: Carolyn Sampson MRN: [id number] DOB:1960/11/13, 62 y.o., female Today's Date: 08/29/2023  END OF SESSION:  PT End of Session - 08/29/23 1557     Visit Number 13    Number of Visits 17    Date for PT Re-Evaluation 10/08/23    PT Start Time 1558    PT Stop Time 1640    PT Time Calculation (min) 42 min    Activity Tolerance Patient limited by pain;Patient tolerated treatment well    Behavior During Therapy [facility] for tasks assessed/performed             Past Medical History:  Diagnosis Date   Fatty liver    Pre-diabetes    Past Surgical History:  Procedure Laterality Date   ABDOMINAL HYSTERECTOMY     APPENDECTOMY     CESAREAN SECTION     CESAREAN SECTION     COLONOSCOPY WITH PROPOFOL N/A 06/02/2021   Procedure: COLONOSCOPY WITH PROPOFOL;  Surgeon: Toney Reil, MD;  Location: [facility];  Service: Gastroenterology;  Laterality: N/A;  SPANISH INTERPRETER   LAPAROSCOPIC HYSTERECTOMY     LAPAROSCOPIC OOPHERECTOMY Right    SHOULDER ARTHROSCOPY WITH ROTATOR CUFF REPAIR AND SUBACROMIAL DECOMPRESSION Left 08/22/2021   Procedure: SHOULDER ARTHROSCOPY WITH ROTATOR CUFF REPAIR AND SUBACROMIAL DECOMPRESSION;  Surgeon: Jones Broom, MD;  Location: [facility];  Service: Orthopedics;  Laterality: Left;   Patient Active Problem List   Diagnosis Date Noted   Screen for colon cancer    Muscle cramps 05/28/2018   Pre-diabetes 05/28/2018   Fatty liver 05/28/2018   Hair loss 05/28/2018   Sciatica 11/01/2015   S/P right oophorectomy 11/01/2015   S/P hysterectomy 11/01/2015    PCP: N/A  REFERRING PROVIDER: Jones Broom, MD  REFERRING DIAG:  M25.519 (ICD-10-CM) - Pain in unspecified shoulder    THERAPY DIAG:  Chronic left shoulder pain  Stiffness of left shoulder, not elsewhere classified  Muscle weakness (generalized)  Acute pain of left shoulder  Rationale for Evaluation and  Treatment: Rehabilitation  ONSET DATE: 07/17/2020  SUBJECTIVE:                                                                                                                                                                                      SUBJECTIVE STATEMENT:  Pt reports some burning sensation in the L shoulder.  Pt notes 5/10 pain, same as last visit.  Pt is accompanied by contracted translator: Lillia Mountain  Hand dominance: Right  PERTINENT HISTORY: Pt is a 62 y/o F w/ chronic bilat shoulder pain LUE> RUE. Pt s/p L shoulder arthroscopy w/ RTC repair  and subacromial decompression in November, 2022. Pt's has had ongoing pain since this surgery and has had multiple bouts of physical therapy. Her previous physical therapy being in July, 2024 was d/c to have further orthopedic specialist treatment of the L shoulder. Pt received a cortisone injection in her L shoulder which decreased her pain mildly, and received a new referral to continue physical therapy. Pt reports increased R shoulder pain 2/2 to overuse to compensate from L shoulder pain/ weakness. Pt reports difficulty with carrying objects and lifting with her left arm. Pt's sx are relieved by ceasing movement, icing the L shoulder and mild relief from corticosteroids injections. Pain at best is 4-5/10 and pain at worst is a 7/10. Pt notes mild N/T traveling down the L arm. Along with, difficulty sleeping on her left side, increasing her pain and must have a pillow under her arm.   PAIN:  Are you having pain? Yes: NPRS scale: 1/10 Pain location: L lateral and anterior shoulder  Pain description: Sharp, stabbing Aggravating factors: Holding something, cooking, lifting arm up  Relieving factors: Steroid injection, ice 4-5/10 NPRS pain at best  PRECAUTIONS: None  RED FLAGS: None   WEIGHT BEARING RESTRICTIONS: No  FALLS:  Has patient fallen in last 6 months? No  OCCUPATION: Unemployed 2/2 L shoulder pain   PLOF:  Independent  PATIENT GOALS: To reduce her pain.   NEXT MD VISIT:   OBJECTIVE:  Note: Objective measures were completed at Evaluation unless otherwise noted.  DIAGNOSTIC FINDINGS:  03/18/2022- IMPRESSION:  Status post left shoulder surgery 08/2021. Left shoulder pain and weakness. Compared to 07/12/2021:   1. Interval anterior supraspinatus tendon repair. The prior high-grade partial-thickness tear is no longer seen. There is moderate to high-grade anterior supraspinatus chronic tendinosis. 2. Moderate anterior infraspinatus and mild superior subscapularis chronic tendinosis. 3. Moderate proximal long head of the biceps tendinosis. 4. Likely interval partial distal clavicle excision and mild acromioplasty. 5. Mild-to-moderate subacromial/subdeltoid bursitis, similar to prior.  PATIENT SURVEYS:  FOTO 38/54    COGNITION: Overall cognitive status: Within functional limits for tasks assessed     SENSATION: UQNS: mild decrease in sensation at C2-C3 and C4-C5 dermatomal pattern   POSTURE: Rounded shoulders noted  UPPER EXTREMITY ROM:  Cervical AROM:       Lateral flexion L- 35 deg R-45 deg*     Rotation L 50 deg R 60 deg*    Extension 20 deg*    Flexion 50 deg   Active ROM Right eval Left eval  Shoulder flexion WFL 105*  Shoulder extension    Shoulder abduction WFL* 91*  Shoulder adduction    Shoulder internal rotation L1 L buttock/65 deg*  Shoulder external rotation T7* C2/35 deg*  Elbow flexion    Elbow extension    Wrist flexion    Wrist extension    Wrist ulnar deviation    Wrist radial deviation    Wrist pronation    Wrist supination    (Blank rows = not tested)  PROM: L IR: 75 deg* L ER: 55 deg*  *denotes pain with movement  PROM: R Shoulder- Flexion and Abduction WFL w/ pain at end range PROM: L Shoulder- Flexion and Abduction limited to 90 degrees w/ pain throughout the motion and pain limiting further assessment.   UPPER EXTREMITY MMT:  MMT  Right eval Left eval  Shoulder flexion 3+* 2-*  Shoulder extension    Shoulder abduction 3* 2-*  Shoulder adduction    Shoulder internal rotation 4- 2-*  Shoulder external rotation 4- 3+*  Middle trapezius    Lower trapezius    Elbow flexion    Elbow extension    Wrist flexion    Wrist extension    Wrist ulnar deviation    Wrist radial deviation    Wrist pronation    Wrist supination    Grip strength (lbs)    (Blank rows = not tested)  *denotes pain with movement  SHOULDER SPECIAL TESTS: Not performed 2/2 to increased irritability of pt's sx.   JOINT MOBILITY TESTING:   No significant hypomobility noted between R shoulder and L shoulder joint. L shoulder noting increased pain with muscle guarding. AP/PA and inferior bilaterally  PALPATION:  Anterior and lateral portion of left shoulder TTP    TODAY'S TREATMENT: DATE: 08/29/23  TherEx:   UBE L2, 2 min fwd, 2 min backward for UE warm up  Seated pulleys scaption AAROM: 3x12  Supine chest press with PVC, 2x10 Supine serratus punches, x8, unable to continue due to pain in the anterior lateral portion of the shoulder  Supine manual resisted shoulder ER/IR of LUE.   Manual:  Supine PROM of the L shoulder in all planes of mobility Supine light tissue massage to the areas that are sensitive and education provided on performance of desensitization exercises and use of it at home to reduce pain levels     PATIENT EDUCATION: Education details: HEP given to pt Person educated: Patient Education method: Medical illustrator Education comprehension: verbalized understanding and returned demonstration  HOME EXERCISE PROGRAM:   Access Code: [email or web address] URL: [email or web address] Date: 07/11/2023 Prepared by: Ronnie Derby  Exercises - Seated Upper Trapezius Stretch  - 1 x daily - 5-7 x weekly - 2 sets - 3 reps - 20 hold - Cervical Extension Stretch  - 1 x daily - 5-7 x weekly - 1 sets - 10 reps  - 5 hold - Supine Shoulder Flexion Extension AAROM with Dowel  - 1 x daily - 5-7 x weekly - 2 sets - 12 reps - Supine Shoulder External Rotation with Dowel  - 1 x daily - 5-7 x weekly - 2 sets - 12 reps - Seated Shoulder Abduction Towel Slide at Table Top  - 1 x daily - 5-7 x weekly - 3 sets - 6 reps   ASSESSMENT:  CLINICAL IMPRESSION:  Pt very sensitive with manual therapy and notes significant pulling with any distraction of the shoulder, as well as light touch to the anterior/lateral portion of the L shoulder.  Pt notes that it is extremely tender when touching it, and pt was educated on use of desensitization exercises in order to improve the tenderness and interrupt the pain pattern.  Pt also given ice at the end in order to relieve some of the discomfort.  Pt may benefit from ice applied before and after the sessions in order to improve pain tolerance for activities.   Pt will continue to benefit from skilled therapy to address remaining deficits in order to improve overall QoL and return to PLOF.      OBJECTIVE IMPAIRMENTS: decreased mobility, decreased ROM, decreased strength, hypomobility, impaired flexibility, impaired UE functional use, and pain.   ACTIVITY LIMITATIONS: carrying, lifting, sleeping, and bed mobility  PARTICIPATION LIMITATIONS: cleaning, laundry, shopping, and community activity  PERSONAL FACTORS: Age, Past/current experiences, and Time since onset of injury/illness/exacerbation are also affecting patient's functional outcome.   REHAB POTENTIAL: Fair chronicity of L shoulder pain  CLINICAL DECISION MAKING: Evolving/moderate complexity  EVALUATION COMPLEXITY: Moderate  GOALS: Goals reviewed with patient? Yes  SHORT TERM GOALS: Target date: 07/30/2023  Pt will be independent with HEP to improve L shoulder strength and decrease pain with functional activities   Baseline:07/02/23: HEP given at today's session. 08/16/23: HEP compliant.  Goal status:  MET  LONG TERM GOALS: Target date: 10/08/2023  Pt will improve FOTO to target score to demonstrate clinically significant improvement in functional mobility.  Baseline: 07/02/23: Deferred to next session 07/09/23: 38/54 08/16/23: 41/54; 08/27/23: 44/54 Goal status: ONGOING  2.  Pt will decrease pain to <5/10 to demonstrate clinically significant improvement in pain levels with functional activity and ADL's.  Baseline: 07/02/23: 7/10 NPRS 08/16/23: 7.5-8/10 NPRS; 08/27/23: 7-8/10 NPS. Goal status: ONGOING  3.  Pt will improve all L shoulder AROM to WFL/ to match pt's R shoulder to improve functional mobility of the LUE.  Baseline: 07/02/23:  Active ROM Right eval Left eval Left 08/16/23 (measured in seated)  Shoulder flexion WFL 105* deg 105* deg  Shoulder extension     Shoulder abduction WFL* 91* 90* deg  Shoulder adduction     Shoulder internal rotation L1 L buttock/65 deg* L buttock/75 deg*   Shoulder external rotation T7* C2/35 deg* C4/ 60 deg*  *pain at end range   Goal status: ONGOING  4.   Pt will self report the ability to sleep on her left arm positioned to pt comfort, w/o increased pain to demonstrate improvements with ability to improve sleep quality.  Baseline: 07/02/23: Unable to sleep on left arm w/o a pillow underneath. 08/16/23: Pt unable to sleep on the L arm w/o a pillow underneath it; 08/27/23: avoids still due to pain.  Goal status: ONGOING  PLAN:  PT FREQUENCY: 2x/week  PT DURATION: 6 weeks  PLANNED INTERVENTIONS: Therapeutic exercises, Therapeutic activity, Neuromuscular re-education, Balance training, Gait training, Patient/Family education, Self Care, Joint mobilization, Joint manipulation, Spinal manipulation, Spinal mobilization, Cryotherapy, Moist heat, and Re-evaluation   PLAN FOR NEXT SESSION: Continue to progress L shoulder AROM and strengthening exercises. (Emphasize ext, abduction and flexion AROM of L shoulder).  Ice before/post session for pain  modulation?     Nolon Bussing, PT, DPT Physical Therapist - [facility]  08/29/23, 3:57 PM

## 2023-09-07 ENCOUNTER — Ambulatory Visit: Payer: Medicaid Other | Attending: Orthopedic Surgery

## 2023-09-07 DIAGNOSIS — M25612 Stiffness of left shoulder, not elsewhere classified: Secondary | ICD-10-CM | POA: Diagnosis present

## 2023-09-07 DIAGNOSIS — M25512 Pain in left shoulder: Secondary | ICD-10-CM | POA: Diagnosis present

## 2023-09-07 DIAGNOSIS — G8929 Other chronic pain: Secondary | ICD-10-CM

## 2023-09-07 DIAGNOSIS — M6281 Muscle weakness (generalized): Secondary | ICD-10-CM

## 2023-09-07 NOTE — Therapy (Signed)
OUTPATIENT PHYSICAL THERAPY SHOULDER TREATMENT   Patient Name: Carolyn Sampson MRN: [id number] DOB:03/23/1961, 62 y.o., female Today's Date: 09/07/2023  END OF SESSION:  PT End of Session - 09/07/23 1031     Visit Number 14    Number of Visits 17    Date for PT Re-Evaluation 10/08/23    PT Start Time 1031    PT Stop Time 1115    PT Time Calculation (min) 44 min    Activity Tolerance Patient limited by pain;Patient tolerated treatment well    Behavior During Therapy [facility] for tasks assessed/performed             Past Medical History:  Diagnosis Date   Fatty liver    Pre-diabetes    Past Surgical History:  Procedure Laterality Date   ABDOMINAL HYSTERECTOMY     APPENDECTOMY     CESAREAN SECTION     CESAREAN SECTION     COLONOSCOPY WITH PROPOFOL N/A 06/02/2021   Procedure: COLONOSCOPY WITH PROPOFOL;  Surgeon: Toney Reil, MD;  Location: [facility];  Service: Gastroenterology;  Laterality: N/A;  SPANISH INTERPRETER   LAPAROSCOPIC HYSTERECTOMY     LAPAROSCOPIC OOPHERECTOMY Right    SHOULDER ARTHROSCOPY WITH ROTATOR CUFF REPAIR AND SUBACROMIAL DECOMPRESSION Left 08/22/2021   Procedure: SHOULDER ARTHROSCOPY WITH ROTATOR CUFF REPAIR AND SUBACROMIAL DECOMPRESSION;  Surgeon: Jones Broom, MD;  Location: [facility];  Service: Orthopedics;  Laterality: Left;   Patient Active Problem List   Diagnosis Date Noted   Screen for colon cancer    Muscle cramps 05/28/2018   Pre-diabetes 05/28/2018   Fatty liver 05/28/2018   Hair loss 05/28/2018   Sciatica 11/01/2015   S/P right oophorectomy 11/01/2015   S/P hysterectomy 11/01/2015    PCP: N/A  REFERRING PROVIDER: Jones Broom, MD  REFERRING DIAG:  M25.519 (ICD-10-CM) - Pain in unspecified shoulder    THERAPY DIAG:  Chronic left shoulder pain  Stiffness of left shoulder, not elsewhere classified  Muscle weakness (generalized)  Acute pain of left shoulder  Rationale for Evaluation and  Treatment: Rehabilitation  ONSET DATE: 07/17/2020  SUBJECTIVE:                                                                                                                                                                                      SUBJECTIVE STATEMENT:  Pt reports she bumped her shoulder into the wall yesterday.  Pt notes that she has been in pain, but took a pill and she is doing much better now.  Denying much pain at all.  Pt is accompanied by contracted translator: Milly  Hand dominance: Right  PERTINENT HISTORY: Pt is a 62  y/o F w/ chronic bilat shoulder pain LUE> RUE. Pt s/p L shoulder arthroscopy w/ RTC repair and subacromial decompression in November, 2022. Pt's has had ongoing pain since this surgery and has had multiple bouts of physical therapy. Her previous physical therapy being in July, 2024 was d/c to have further orthopedic specialist treatment of the L shoulder. Pt received a cortisone injection in her L shoulder which decreased her pain mildly, and received a new referral to continue physical therapy. Pt reports increased R shoulder pain 2/2 to overuse to compensate from L shoulder pain/ weakness. Pt reports difficulty with carrying objects and lifting with her left arm. Pt's sx are relieved by ceasing movement, icing the L shoulder and mild relief from corticosteroids injections. Pain at best is 4-5/10 and pain at worst is a 7/10. Pt notes mild N/T traveling down the L arm. Along with, difficulty sleeping on her left side, increasing her pain and must have a pillow under her arm.   PAIN:  Are you having pain? Yes: NPRS scale: 1/10 Pain location: L lateral and anterior shoulder  Pain description: Sharp, stabbing Aggravating factors: Holding something, cooking, lifting arm up  Relieving factors: Steroid injection, ice 4-5/10 NPRS pain at best  PRECAUTIONS: None  RED FLAGS: None   WEIGHT BEARING RESTRICTIONS: No  FALLS:  Has patient fallen in last 6  months? No  OCCUPATION: Unemployed 2/2 L shoulder pain   PLOF: Independent  PATIENT GOALS: To reduce her pain.   NEXT MD VISIT:   OBJECTIVE:  Note: Objective measures were completed at Evaluation unless otherwise noted.  DIAGNOSTIC FINDINGS:  03/18/2022- IMPRESSION:  Status post left shoulder surgery 08/2021. Left shoulder pain and weakness. Compared to 07/12/2021:   1. Interval anterior supraspinatus tendon repair. The prior high-grade partial-thickness tear is no longer seen. There is moderate to high-grade anterior supraspinatus chronic tendinosis. 2. Moderate anterior infraspinatus and mild superior subscapularis chronic tendinosis. 3. Moderate proximal long head of the biceps tendinosis. 4. Likely interval partial distal clavicle excision and mild acromioplasty. 5. Mild-to-moderate subacromial/subdeltoid bursitis, similar to prior.  PATIENT SURVEYS:  FOTO 38/54    COGNITION: Overall cognitive status: Within functional limits for tasks assessed     SENSATION: UQNS: mild decrease in sensation at C2-C3 and C4-C5 dermatomal pattern   POSTURE: Rounded shoulders noted  UPPER EXTREMITY ROM:  Cervical AROM:       Lateral flexion L- 35 deg R-45 deg*     Rotation L 50 deg R 60 deg*    Extension 20 deg*    Flexion 50 deg   Active ROM Right eval Left eval  Shoulder flexion WFL 105*  Shoulder extension    Shoulder abduction WFL* 91*  Shoulder adduction    Shoulder internal rotation L1 L buttock/65 deg*  Shoulder external rotation T7* C2/35 deg*  Elbow flexion    Elbow extension    Wrist flexion    Wrist extension    Wrist ulnar deviation    Wrist radial deviation    Wrist pronation    Wrist supination    (Blank rows = not tested)  PROM: L IR: 75 deg* L ER: 55 deg*  *denotes pain with movement  PROM: R Shoulder- Flexion and Abduction WFL w/ pain at end range PROM: L Shoulder- Flexion and Abduction limited to 90 degrees w/ pain throughout the motion and  pain limiting further assessment.   UPPER EXTREMITY MMT:  MMT Right eval Left eval  Shoulder flexion 3+* 2-*  Shoulder extension  Shoulder abduction 3* 2-*  Shoulder adduction    Shoulder internal rotation 4- 2-*  Shoulder external rotation 4- 3+*  Middle trapezius    Lower trapezius    Elbow flexion    Elbow extension    Wrist flexion    Wrist extension    Wrist ulnar deviation    Wrist radial deviation    Wrist pronation    Wrist supination    Grip strength (lbs)    (Blank rows = not tested)  *denotes pain with movement  SHOULDER SPECIAL TESTS: Not performed 2/2 to increased irritability of pt's sx.   JOINT MOBILITY TESTING:   No significant hypomobility noted between R shoulder and L shoulder joint. L shoulder noting increased pain with muscle guarding. AP/PA and inferior bilaterally  PALPATION:  Anterior and lateral portion of left shoulder TTP    TODAY'S TREATMENT: DATE: 09/07/23  There.ex:     Supine shoulder AAROM with 2# AW: 1x12, 1x6    Supine chest press with dowel and 2# AW: 3x8    Standing UE ranger shoulder flexion AAROM: 2x10,  Standing UE ranger lateral movement in forward flexion AAROM: 2x10   Seated pulleys scaption AAROM: 3x12    Standing shoulder extension AAROM with dowel: 2x8. Feels better with pronated grip per pt report   Seated bicep curls neutral grip with eccentric portion: 2x10, 2# DB, BUE's.  Seated bicep curls supinated grip with eccentric portion: 2x10, 2# DB, BUE's.   Supine chest press with PVC, 2x10 Supine serratus punches, x8, unable to continue due to pain in the anterior lateral portion of the shoulder  Supine manual resisted shoulder ER/IR of LUE.       PATIENT EDUCATION: Education details: HEP given to pt Person educated: Patient Education method: Medical illustrator Education comprehension: verbalized understanding and returned demonstration  HOME EXERCISE PROGRAM:   Access Code:  [email or web address] URL: [email or web address] Date: 07/11/2023 Prepared by: Ronnie Derby  Exercises - Seated Upper Trapezius Stretch  - 1 x daily - 5-7 x weekly - 2 sets - 3 reps - 20 hold - Cervical Extension Stretch  - 1 x daily - 5-7 x weekly - 1 sets - 10 reps - 5 hold - Supine Shoulder Flexion Extension AAROM with Dowel  - 1 x daily - 5-7 x weekly - 2 sets - 12 reps - Supine Shoulder External Rotation with Dowel  - 1 x daily - 5-7 x weekly - 2 sets - 12 reps - Seated Shoulder Abduction Towel Slide at Table Top  - 1 x daily - 5-7 x weekly - 3 sets - 6 reps   ASSESSMENT:  CLINICAL IMPRESSION:  Pt continues to have difficulty with exercises, noting a significant amount of pain with any mobility of the L shoulder past 90 deg of flexion/abduction.  Pt does however continue to push through and make effortful attempts at performing the resisted exercises.  Pt has difficulty with performing serratus punches straight up from a supine position, but is able to perform in a slightly abducted position.   Pt will continue to benefit from skilled therapy to address remaining deficits in order to improve overall QoL and return to PLOF.       OBJECTIVE IMPAIRMENTS: decreased mobility, decreased ROM, decreased strength, hypomobility, impaired flexibility, impaired UE functional use, and pain.   ACTIVITY LIMITATIONS: carrying, lifting, sleeping, and bed mobility  PARTICIPATION LIMITATIONS: cleaning, laundry, shopping, and community activity  PERSONAL FACTORS: Age, Past/current experiences, and Time since onset of injury/illness/exacerbation are also  affecting patient's functional outcome.   REHAB POTENTIAL: Fair chronicity of L shoulder pain  CLINICAL DECISION MAKING: Evolving/moderate complexity  EVALUATION COMPLEXITY: Moderate   GOALS: Goals reviewed with patient? Yes  SHORT TERM GOALS: Target date: 07/30/2023  Pt will be independent with HEP to improve L shoulder strength and  decrease pain with functional activities   Baseline:07/02/23: HEP given at today's session. 08/16/23: HEP compliant.  Goal status: MET  LONG TERM GOALS: Target date: 10/08/2023  Pt will improve FOTO to target score to demonstrate clinically significant improvement in functional mobility.  Baseline: 07/02/23: Deferred to next session 07/09/23: 38/54 08/16/23: 41/54; 08/27/23: 44/54 Goal status: ONGOING  2.  Pt will decrease pain to <5/10 to demonstrate clinically significant improvement in pain levels with functional activity and ADL's.  Baseline: 07/02/23: 7/10 NPRS 08/16/23: 7.5-8/10 NPRS; 08/27/23: 7-8/10 NPS. Goal status: ONGOING  3.  Pt will improve all L shoulder AROM to WFL/ to match pt's R shoulder to improve functional mobility of the LUE.  Baseline: 07/02/23:  Active ROM Right eval Left eval Left 08/16/23 (measured in seated)  Shoulder flexion WFL 105* deg 105* deg  Shoulder extension     Shoulder abduction WFL* 91* 90* deg  Shoulder adduction     Shoulder internal rotation L1 L buttock/65 deg* L buttock/75 deg*   Shoulder external rotation T7* C2/35 deg* C4/ 60 deg*  *pain at end range   Goal status: ONGOING  4.   Pt will self report the ability to sleep on her left arm positioned to pt comfort, w/o increased pain to demonstrate improvements with ability to improve sleep quality.  Baseline: 07/02/23: Unable to sleep on left arm w/o a pillow underneath. 08/16/23: Pt unable to sleep on the L arm w/o a pillow underneath it; 08/27/23: avoids still due to pain.  Goal status: ONGOING  PLAN:  PT FREQUENCY: 2x/week  PT DURATION: 6 weeks  PLANNED INTERVENTIONS: Therapeutic exercises, Therapeutic activity, Neuromuscular re-education, Balance training, Gait training, Patient/Family education, Self Care, Joint mobilization, Joint manipulation, Spinal manipulation, Spinal mobilization, Cryotherapy, Moist heat, and Re-evaluation   PLAN FOR NEXT SESSION: Continue to progress L shoulder  AROM and strengthening exercises. (Emphasize ext, abduction and flexion AROM of L shoulder).  Ice before/post session for pain modulation?     Nolon Bussing, PT, DPT Physical Therapist - [facility]  09/07/23, 10:31 AM

## 2023-09-10 ENCOUNTER — Telehealth: Payer: Self-pay

## 2023-09-10 NOTE — Telephone Encounter (Signed)
PT utilizing interpreter, Vernona Rieger to call pt about missed appointment. Pt had misunderstanding with her scheduled appointment today but is aware of her next scheduled time reporting she will be present for the next one.

## 2023-09-14 ENCOUNTER — Ambulatory Visit: Payer: Medicaid Other

## 2023-09-14 DIAGNOSIS — G8929 Other chronic pain: Secondary | ICD-10-CM

## 2023-09-14 DIAGNOSIS — M25612 Stiffness of left shoulder, not elsewhere classified: Secondary | ICD-10-CM

## 2023-09-14 DIAGNOSIS — M25512 Pain in left shoulder: Secondary | ICD-10-CM | POA: Diagnosis not present

## 2023-09-14 DIAGNOSIS — M6281 Muscle weakness (generalized): Secondary | ICD-10-CM

## 2023-09-14 NOTE — Therapy (Signed)
OUTPATIENT PHYSICAL THERAPY SHOULDER TREATMENT   Patient Name: Carolyn Sampson MRN: [id number] DOB:08-23-1961, 62 y.o., female Today's Date: 09/14/2023  END OF SESSION:  PT End of Session - 09/14/23 0906     Visit Number 15    Number of Visits 17    Date for PT Re-Evaluation 10/08/23    PT Start Time 0901    PT Stop Time 0945    PT Time Calculation (min) 44 min    Activity Tolerance Patient limited by pain;Patient tolerated treatment well    Behavior During Therapy [facility] for tasks assessed/performed             Past Medical History:  Diagnosis Date   Fatty liver    Pre-diabetes    Past Surgical History:  Procedure Laterality Date   ABDOMINAL HYSTERECTOMY     APPENDECTOMY     CESAREAN SECTION     CESAREAN SECTION     COLONOSCOPY WITH PROPOFOL N/A 06/02/2021   Procedure: COLONOSCOPY WITH PROPOFOL;  Surgeon: Toney Reil, MD;  Location: [facility];  Service: Gastroenterology;  Laterality: N/A;  SPANISH INTERPRETER   LAPAROSCOPIC HYSTERECTOMY     LAPAROSCOPIC OOPHERECTOMY Right    SHOULDER ARTHROSCOPY WITH ROTATOR CUFF REPAIR AND SUBACROMIAL DECOMPRESSION Left 08/22/2021   Procedure: SHOULDER ARTHROSCOPY WITH ROTATOR CUFF REPAIR AND SUBACROMIAL DECOMPRESSION;  Surgeon: Jones Broom, MD;  Location: [facility];  Service: Orthopedics;  Laterality: Left;   Patient Active Problem List   Diagnosis Date Noted   Screen for colon cancer    Muscle cramps 05/28/2018   Pre-diabetes 05/28/2018   Fatty liver 05/28/2018   Hair loss 05/28/2018   Sciatica 11/01/2015   S/P right oophorectomy 11/01/2015   S/P hysterectomy 11/01/2015    PCP: N/A  REFERRING PROVIDER: Jones Broom, MD  REFERRING DIAG:  M25.519 (ICD-10-CM) - Pain in unspecified shoulder    THERAPY DIAG:  Chronic left shoulder pain  Stiffness of left shoulder, not elsewhere classified  Muscle weakness (generalized)  Rationale for Evaluation and Treatment: Rehabilitation  ONSET  DATE: 07/17/2020  SUBJECTIVE:                                                                                                                                                                                      SUBJECTIVE STATEMENT:  Pt reports 5/10 NPS in L shoulder.   Pt is accompanied by contracted translator: Lillia Mountain  Hand dominance: Right  PERTINENT HISTORY: Pt is a 62 y/o F w/ chronic bilat shoulder pain LUE> RUE. Pt s/p L shoulder arthroscopy w/ RTC repair and subacromial decompression in November, 2022. Pt's has had ongoing pain since this surgery and has  had multiple bouts of physical therapy. Her previous physical therapy being in July, 2024 was d/c to have further orthopedic specialist treatment of the L shoulder. Pt received a cortisone injection in her L shoulder which decreased her pain mildly, and received a new referral to continue physical therapy. Pt reports increased R shoulder pain 2/2 to overuse to compensate from L shoulder pain/ weakness. Pt reports difficulty with carrying objects and lifting with her left arm. Pt's sx are relieved by ceasing movement, icing the L shoulder and mild relief from corticosteroids injections. Pain at best is 4-5/10 and pain at worst is a 7/10. Pt notes mild N/T traveling down the L arm. Along with, difficulty sleeping on her left side, increasing her pain and must have a pillow under her arm.   PAIN:  Are you having pain? Yes: NPRS scale: 5/10 Pain location: L lateral and anterior shoulder  Pain description: Sharp, stabbing Aggravating factors: Holding something, cooking, lifting arm up  Relieving factors: Steroid injection, ice 4-5/10 NPRS pain at best  PRECAUTIONS: None  RED FLAGS: None   WEIGHT BEARING RESTRICTIONS: No  FALLS:  Has patient fallen in last 6 months? No  OCCUPATION: Unemployed 2/2 L shoulder pain   PLOF: Independent  PATIENT GOALS: To reduce her pain.   NEXT MD VISIT:   OBJECTIVE:  Note: Objective  measures were completed at Evaluation unless otherwise noted.  DIAGNOSTIC FINDINGS:  03/18/2022- IMPRESSION:  Status post left shoulder surgery 08/2021. Left shoulder pain and weakness. Compared to 07/12/2021:   1. Interval anterior supraspinatus tendon repair. The prior high-grade partial-thickness tear is no longer seen. There is moderate to high-grade anterior supraspinatus chronic tendinosis. 2. Moderate anterior infraspinatus and mild superior subscapularis chronic tendinosis. 3. Moderate proximal long head of the biceps tendinosis. 4. Likely interval partial distal clavicle excision and mild acromioplasty. 5. Mild-to-moderate subacromial/subdeltoid bursitis, similar to prior.  PATIENT SURVEYS:  FOTO 38/54    COGNITION: Overall cognitive status: Within functional limits for tasks assessed     SENSATION: UQNS: mild decrease in sensation at C2-C3 and C4-C5 dermatomal pattern   POSTURE: Rounded shoulders noted  UPPER EXTREMITY ROM:  Cervical AROM:       Lateral flexion L- 35 deg R-45 deg*     Rotation L 50 deg R 60 deg*    Extension 20 deg*    Flexion 50 deg   Active ROM Right eval Left eval  Shoulder flexion WFL 105*  Shoulder extension    Shoulder abduction WFL* 91*  Shoulder adduction    Shoulder internal rotation L1 L buttock/65 deg*  Shoulder external rotation T7* C2/35 deg*  Elbow flexion    Elbow extension    Wrist flexion    Wrist extension    Wrist ulnar deviation    Wrist radial deviation    Wrist pronation    Wrist supination    (Blank rows = not tested)  PROM: L IR: 75 deg* L ER: 55 deg*  *denotes pain with movement  PROM: R Shoulder- Flexion and Abduction WFL w/ pain at end range PROM: L Shoulder- Flexion and Abduction limited to 90 degrees w/ pain throughout the motion and pain limiting further assessment.   UPPER EXTREMITY MMT:  MMT Right eval Left eval  Shoulder flexion 3+* 2-*  Shoulder extension    Shoulder abduction 3* 2-*   Shoulder adduction    Shoulder internal rotation 4- 2-*  Shoulder external rotation 4- 3+*  Middle trapezius    Lower trapezius  Elbow flexion    Elbow extension    Wrist flexion    Wrist extension    Wrist ulnar deviation    Wrist radial deviation    Wrist pronation    Wrist supination    Grip strength (lbs)    (Blank rows = not tested)  *denotes pain with movement  SHOULDER SPECIAL TESTS: Not performed 2/2 to increased irritability of pt's sx.   JOINT MOBILITY TESTING:   No significant hypomobility noted between R shoulder and L shoulder joint. L shoulder noting increased pain with muscle guarding. AP/PA and inferior bilaterally  PALPATION:  Anterior and lateral portion of left shoulder TTP    TODAY'S TREATMENT: DATE: 09/14/23  There.ex:  UBE 2 min forward, 2 minutes backward for LUE warm up, L2    Supine shoulder AAROM with no weight: 2x12,    Supine chest press with dowel and 2# AW: 3x8   Seated L shoulder bicep curl neutral with concentric, supinated with eccentric portion: 2x8   Standing UE ranger shoulder flexion AAROM: 2x10  PATIENT EDUCATION: Education details: HEP given to pt Person educated: Patient Education method: Medical illustrator Education comprehension: verbalized understanding and returned demonstration  HOME EXERCISE PROGRAM:   Access Code: [email or web address] URL: [email or web address] Date: 07/11/2023 Prepared by: Ronnie Derby  Exercises - Seated Upper Trapezius Stretch  - 1 x daily - 5-7 x weekly - 2 sets - 3 reps - 20 hold - Cervical Extension Stretch  - 1 x daily - 5-7 x weekly - 1 sets - 10 reps - 5 hold - Supine Shoulder Flexion Extension AAROM with Dowel  - 1 x daily - 5-7 x weekly - 2 sets - 12 reps - Supine Shoulder External Rotation with Dowel  - 1 x daily - 5-7 x weekly - 2 sets - 12 reps - Seated Shoulder Abduction Towel Slide at Table Top  - 1 x daily - 5-7 x weekly - 3 sets - 6  reps   ASSESSMENT:  CLINICAL IMPRESSION: Continuing PT POC focusing on gradual overhead mobility and periscapular strength training. Session regressed as pt has aggravated symptoms today. Pt educated on need to authorize for more visits as pt is approaching end of POC. Pt understanding of education. Pt continues to rely on AAROM to perform > 90 degrees of overhead activity with moderate to severe pain levels. Pt will continue to benefit from skilled therapy to address remaining deficits in order to improve overall QoL and return to PLOF.      OBJECTIVE IMPAIRMENTS: decreased mobility, decreased ROM, decreased strength, hypomobility, impaired flexibility, impaired UE functional use, and pain.   ACTIVITY LIMITATIONS: carrying, lifting, sleeping, and bed mobility  PARTICIPATION LIMITATIONS: cleaning, laundry, shopping, and community activity  PERSONAL FACTORS: Age, Past/current experiences, and Time since onset of injury/illness/exacerbation are also affecting patient's functional outcome.   REHAB POTENTIAL: Fair chronicity of L shoulder pain  CLINICAL DECISION MAKING: Evolving/moderate complexity  EVALUATION COMPLEXITY: Moderate   GOALS: Goals reviewed with patient? Yes  SHORT TERM GOALS: Target date: 07/30/2023  Pt will be independent with HEP to improve L shoulder strength and decrease pain with functional activities   Baseline:07/02/23: HEP given at today's session. 08/16/23: HEP compliant.  Goal status: MET  LONG TERM GOALS: Target date: 10/08/2023  Pt will improve FOTO to target score to demonstrate clinically significant improvement in functional mobility.  Baseline: 07/02/23: Deferred to next session 07/09/23: 38/54 08/16/23: 41/54; 08/27/23: 44/54 Goal status: ONGOING  2.  Pt will decrease pain to <  5/10 to demonstrate clinically significant improvement in pain levels with functional activity and ADL's.  Baseline: 07/02/23: 7/10 NPRS 08/16/23: 7.5-8/10 NPRS; 08/27/23: 7-8/10  NPS. Goal status: ONGOING  3.  Pt will improve all L shoulder AROM to WFL/ to match pt's R shoulder to improve functional mobility of the LUE.  Baseline: 07/02/23:  Active ROM Right eval Left eval Left 08/16/23 (measured in seated)  Shoulder flexion WFL 105* deg 105* deg  Shoulder extension     Shoulder abduction WFL* 91* 90* deg  Shoulder adduction     Shoulder internal rotation L1 L buttock/65 deg* L buttock/75 deg*   Shoulder external rotation T7* C2/35 deg* C4/ 60 deg*  *pain at end range   Goal status: ONGOING  4.   Pt will self report the ability to sleep on her left arm positioned to pt comfort, w/o increased pain to demonstrate improvements with ability to improve sleep quality.  Baseline: 07/02/23: Unable to sleep on left arm w/o a pillow underneath. 08/16/23: Pt unable to sleep on the L arm w/o a pillow underneath it; 08/27/23: avoids still due to pain.  Goal status: ONGOING  PLAN:  PT FREQUENCY: 2x/week  PT DURATION: 6 weeks  PLANNED INTERVENTIONS: Therapeutic exercises, Therapeutic activity, Neuromuscular re-education, Balance training, Gait training, Patient/Family education, Self Care, Joint mobilization, Joint manipulation, Spinal manipulation, Spinal mobilization, Cryotherapy, Moist heat, and Re-evaluation   PLAN FOR NEXT SESSION: RECERT. Continue to progress L shoulder AROM and strengthening exercises. (Emphasize ext, abduction and flexion AROM of L shoulder).  Ice before/post session for pain modulation?     Delphia Grates. Fairly IV, PT, DPT Physical Therapist- [place]  [facility]  09/14/23, 10:13 AM

## 2023-09-19 ENCOUNTER — Ambulatory Visit: Payer: Medicaid Other

## 2023-09-19 DIAGNOSIS — M25612 Stiffness of left shoulder, not elsewhere classified: Secondary | ICD-10-CM

## 2023-09-19 DIAGNOSIS — M6281 Muscle weakness (generalized): Secondary | ICD-10-CM

## 2023-09-19 DIAGNOSIS — G8929 Other chronic pain: Secondary | ICD-10-CM

## 2023-09-19 DIAGNOSIS — M25512 Pain in left shoulder: Secondary | ICD-10-CM | POA: Diagnosis not present

## 2023-09-19 NOTE — Therapy (Signed)
OUTPATIENT PHYSICAL THERAPY SHOULDER TREATMENT   Patient Name: Carolyn Sampson MRN: [id number] DOB:09-Apr-1961, 62 y.o., female Today's Date: 09/19/2023  END OF SESSION:  PT End of Session - 09/19/23 1254     Visit Number 16    Number of Visits 32    Date for PT Re-Evaluation 11/14/23    PT Start Time 1300    PT Stop Time 1344    PT Time Calculation (min) 44 min    Activity Tolerance Patient limited by pain;Patient tolerated treatment well    Behavior During Therapy [facility] for tasks assessed/performed             Past Medical History:  Diagnosis Date   Fatty liver    Pre-diabetes    Past Surgical History:  Procedure Laterality Date   ABDOMINAL HYSTERECTOMY     APPENDECTOMY     CESAREAN SECTION     CESAREAN SECTION     COLONOSCOPY WITH PROPOFOL N/A 06/02/2021   Procedure: COLONOSCOPY WITH PROPOFOL;  Surgeon: Toney Reil, MD;  Location: [facility];  Service: Gastroenterology;  Laterality: N/A;  SPANISH INTERPRETER   LAPAROSCOPIC HYSTERECTOMY     LAPAROSCOPIC OOPHERECTOMY Right    SHOULDER ARTHROSCOPY WITH ROTATOR CUFF REPAIR AND SUBACROMIAL DECOMPRESSION Left 08/22/2021   Procedure: SHOULDER ARTHROSCOPY WITH ROTATOR CUFF REPAIR AND SUBACROMIAL DECOMPRESSION;  Surgeon: Jones Broom, MD;  Location: [facility];  Service: Orthopedics;  Laterality: Left;   Patient Active Problem List   Diagnosis Date Noted   Screen for colon cancer    Muscle cramps 05/28/2018   Pre-diabetes 05/28/2018   Fatty liver 05/28/2018   Hair loss 05/28/2018   Sciatica 11/01/2015   S/P right oophorectomy 11/01/2015   S/P hysterectomy 11/01/2015    PCP: N/A  REFERRING PROVIDER: Jones Broom, MD  REFERRING DIAG:  M25.519 (ICD-10-CM) - Pain in unspecified shoulder    THERAPY DIAG:  Chronic left shoulder pain  Stiffness of left shoulder, not elsewhere classified  Muscle weakness (generalized)  Rationale for Evaluation and Treatment: Rehabilitation  ONSET  DATE: 07/17/2020  SUBJECTIVE:                                                                                                                                                                                      SUBJECTIVE STATEMENT:  Pt reports 1/10 NPS in L shoulder.   Pt is accompanied by contracted translator: Milly  Hand dominance: Right  PERTINENT HISTORY: Pt is a 62 y/o F w/ chronic bilat shoulder pain LUE> RUE. Pt s/p L shoulder arthroscopy w/ RTC repair and subacromial decompression in November, 2022. Pt's has had ongoing pain since this surgery and has had  multiple bouts of physical therapy. Her previous physical therapy being in July, 2024 was d/c to have further orthopedic specialist treatment of the L shoulder. Pt received a cortisone injection in her L shoulder which decreased her pain mildly, and received a new referral to continue physical therapy. Pt reports increased R shoulder pain 2/2 to overuse to compensate from L shoulder pain/ weakness. Pt reports difficulty with carrying objects and lifting with her left arm. Pt's sx are relieved by ceasing movement, icing the L shoulder and mild relief from corticosteroids injections. Pain at best is 4-5/10 and pain at worst is a 7/10. Pt notes mild N/T traveling down the L arm. Along with, difficulty sleeping on her left side, increasing her pain and must have a pillow under her arm.   PAIN:  Are you having pain? Yes: NPRS scale: 1/10 Pain location: L lateral and anterior shoulder  Pain description: Sharp, stabbing Aggravating factors: Holding something, cooking, lifting arm up  Relieving factors: Steroid injection, ice 4-5/10 NPRS pain at best  PRECAUTIONS: None  RED FLAGS: None   WEIGHT BEARING RESTRICTIONS: No  FALLS:  Has patient fallen in last 6 months? No  OCCUPATION: Unemployed 2/2 L shoulder pain   PLOF: Independent  PATIENT GOALS: To reduce her pain.   NEXT MD VISIT:   OBJECTIVE:  Note: Objective measures  were completed at Evaluation unless otherwise noted.  DIAGNOSTIC FINDINGS:  03/18/2022- IMPRESSION:  Status post left shoulder surgery 08/2021. Left shoulder pain and weakness. Compared to 07/12/2021:   1. Interval anterior supraspinatus tendon repair. The prior high-grade partial-thickness tear is no longer seen. There is moderate to high-grade anterior supraspinatus chronic tendinosis. 2. Moderate anterior infraspinatus and mild superior subscapularis chronic tendinosis. 3. Moderate proximal long head of the biceps tendinosis. 4. Likely interval partial distal clavicle excision and mild acromioplasty. 5. Mild-to-moderate subacromial/subdeltoid bursitis, similar to prior.  PATIENT SURVEYS:  FOTO 38/54    COGNITION: Overall cognitive status: Within functional limits for tasks assessed     SENSATION: UQNS: mild decrease in sensation at C2-C3 and C4-C5 dermatomal pattern   POSTURE: Rounded shoulders noted  UPPER EXTREMITY ROM:  Cervical AROM:       Lateral flexion L- 35 deg R-45 deg*     Rotation L 50 deg R 60 deg*    Extension 20 deg*    Flexion 50 deg   Active ROM Right eval Left eval  Shoulder flexion WFL 105*  Shoulder extension    Shoulder abduction WFL* 91*  Shoulder adduction    Shoulder internal rotation L1 L buttock/65 deg*  Shoulder external rotation T7* C2/35 deg*  Elbow flexion    Elbow extension    Wrist flexion    Wrist extension    Wrist ulnar deviation    Wrist radial deviation    Wrist pronation    Wrist supination    (Blank rows = not tested)  PROM: L IR: 75 deg* L ER: 55 deg*  *denotes pain with movement  PROM: R Shoulder- Flexion and Abduction WFL w/ pain at end range PROM: L Shoulder- Flexion and Abduction limited to 90 degrees w/ pain throughout the motion and pain limiting further assessment.   UPPER EXTREMITY MMT:  MMT Right eval Left eval  Shoulder flexion 3+* 2-*  Shoulder extension    Shoulder abduction 3* 2-*  Shoulder  adduction    Shoulder internal rotation 4- 2-*  Shoulder external rotation 4- 3+*  Middle trapezius    Lower trapezius    Elbow  flexion    Elbow extension    Wrist flexion    Wrist extension    Wrist ulnar deviation    Wrist radial deviation    Wrist pronation    Wrist supination    Grip strength (lbs)    (Blank rows = not tested)  *denotes pain with movement  SHOULDER SPECIAL TESTS: Not performed 2/2 to increased irritability of pt's sx.   JOINT MOBILITY TESTING:   No significant hypomobility noted between R shoulder and L shoulder joint. L shoulder noting increased pain with muscle guarding. AP/PA and inferior bilaterally  PALPATION:  Anterior and lateral portion of left shoulder TTP    TODAY'S TREATMENT: DATE: 09/19/23  There.ex:  Pt at end of approved POC. Focus of session on reviewing goals. See goals and clinical impression for details.  PATIENT EDUCATION: Education details: HEP given to pt Person educated: Patient Education method: Medical illustrator Education comprehension: verbalized understanding and returned demonstration  HOME EXERCISE PROGRAM:   Access Code: [email or web address] URL: [email or web address] Date: 07/11/2023 Prepared by: Ronnie Derby  Exercises - Seated Upper Trapezius Stretch  - 1 x daily - 5-7 x weekly - 2 sets - 3 reps - 20 hold - Cervical Extension Stretch  - 1 x daily - 5-7 x weekly - 1 sets - 10 reps - 5 hold - Supine Shoulder Flexion Extension AAROM with Dowel  - 1 x daily - 5-7 x weekly - 2 sets - 12 reps - Supine Shoulder External Rotation with Dowel  - 1 x daily - 5-7 x weekly - 2 sets - 12 reps - Seated Shoulder Abduction Towel Slide at Table Top  - 1 x daily - 5-7 x weekly - 3 sets - 6 reps   ASSESSMENT:  CLINICAL IMPRESSION: Pt at end of current POC warranting recert and request for more visits. Long discussion with interpreter present to discuss f/u steps in L shoulder POC if insurance denies further  visits. Pt has made significant improvements with forward shoulder flexion however has remained grossly unchanged with FOTO, difficulty sleeping on L side due to pain, and still dealing with severe shoulder pain with L shoulder use primarily overhead and behind back ADL's. Pt's L shoulder ER has also diminished from 60 degrees to 45 degrees. Educated pt on possible need for referral back to orthopedic surgeon due to consistent, severe pain levels, weakness, and range of motion deficits. Pt understanding. In the mean time PT to request additional 2x/week for 8 weeks as pt has made ROM improvements for overhead ADL's. Pt will continue to benefit from skilled therapy to address remaining deficits in order to improve overall QoL and return to PLOF.   OBJECTIVE IMPAIRMENTS: decreased mobility, decreased ROM, decreased strength, hypomobility, impaired flexibility, impaired UE functional use, and pain.   ACTIVITY LIMITATIONS: carrying, lifting, sleeping, and bed mobility  PARTICIPATION LIMITATIONS: cleaning, laundry, shopping, and community activity  PERSONAL FACTORS: Age, Past/current experiences, and Time since onset of injury/illness/exacerbation are also affecting patient's functional outcome.   REHAB POTENTIAL: Fair chronicity of L shoulder pain  CLINICAL DECISION MAKING: Evolving/moderate complexity  EVALUATION COMPLEXITY: Moderate   GOALS: Goals reviewed with patient? Yes  SHORT TERM GOALS: Target date: 07/30/2023  Pt will be independent with HEP to improve L shoulder strength and decrease pain with functional activities   Baseline:07/02/23: HEP given at today's session. 08/16/23: HEP compliant.  Goal status: MET  LONG TERM GOALS: Target date: 11/14/2023  Pt will improve FOTO to target score to demonstrate clinically significant  improvement in functional mobility.  Baseline: 07/02/23: Deferred to next session 07/09/23: 38/54 08/16/23: 41/54; 08/27/23: 78/29; 41/54 Goal status:  ONGOING  2.  Pt will decrease pain to <5/10 to demonstrate clinically significant improvement in pain levels with functional activity and ADL's.  Baseline: 07/02/23: 7/10 NPRS 08/16/23: 7.5-8/10 NPRS; 08/27/23: 7-8/10 NPS.; 09/19/23: 7-8/10 NPS Goal status: ONGOING  3.  Pt will improve all L shoulder AROM to WFL/ to match pt's R shoulder to improve functional mobility of the LUE.  Baseline: 07/02/23:  Active ROM Right eval Left eval Left 08/16/23 (measured in seated) Left 09/19/23 (measured in seated)  Shoulder flexion WFL 105* deg 105* deg 120* degrees  Shoulder extension      Shoulder abduction WFL* 91* 90* deg 91* deg  Shoulder adduction      Shoulder internal rotation L1 L buttock/65 deg* L buttock/75 deg*  85  Shoulder external rotation T7* C2/35 deg* C4/ 60 deg* 45 deg  *pain at end range   Goal status: ONGOING  4.   Pt will self report the ability to sleep on her left arm positioned to pt comfort, w/o increased pain to demonstrate improvements with ability to improve sleep quality.  Baseline: 07/02/23: Unable to sleep on left arm w/o a pillow underneath. 08/16/23: Pt unable to sleep on the L arm w/o a pillow underneath it; 08/27/23: avoids still due to pain.; 09/19/23: has tried sleeping on her L shoulder but does have pain. 8/10 NPS.  Goal status: ONGOING  PLAN:  PT FREQUENCY: 2x/week  PT DURATION: 6 weeks  PLANNED INTERVENTIONS: Therapeutic exercises, Therapeutic activity, Neuromuscular re-education, Balance training, Gait training, Patient/Family education, Self Care, Joint mobilization, Joint manipulation, Spinal manipulation, Spinal mobilization, Cryotherapy, Moist heat, and Re-evaluation   PLAN FOR NEXT SESSION: Continue to progress L shoulder AROM and strengthening exercises. (Emphasize ext, abduction and flexion AROM of L shoulder).  Delphia Grates. Fairly IV, PT, DPT Physical Therapist- [facility]  09/19/23, 2:45 PM

## 2023-10-01 ENCOUNTER — Ambulatory Visit: Payer: Medicaid Other

## 2023-10-01 DIAGNOSIS — M6281 Muscle weakness (generalized): Secondary | ICD-10-CM

## 2023-10-01 DIAGNOSIS — M25512 Pain in left shoulder: Secondary | ICD-10-CM | POA: Diagnosis not present

## 2023-10-01 DIAGNOSIS — M25612 Stiffness of left shoulder, not elsewhere classified: Secondary | ICD-10-CM

## 2023-10-01 DIAGNOSIS — G8929 Other chronic pain: Secondary | ICD-10-CM

## 2023-10-01 NOTE — Therapy (Signed)
OUTPATIENT PHYSICAL THERAPY SHOULDER TREATMENT   Patient Name: Carolyn Sampson MRN: [id number] DOB:1961/07/22, 62 y.o., female Today's Date: 10/01/2023  END OF SESSION:  PT End of Session - 10/01/23 1106     Visit Number 17    Number of Visits 32    Date for PT Re-Evaluation 11/14/23    PT Start Time 1107    PT Stop Time 1150    PT Time Calculation (min) 43 min    Activity Tolerance Patient limited by pain;Patient tolerated treatment well    Behavior During Therapy [facility] for tasks assessed/performed             Past Medical History:  Diagnosis Date   Fatty liver    Pre-diabetes    Past Surgical History:  Procedure Laterality Date   ABDOMINAL HYSTERECTOMY     APPENDECTOMY     CESAREAN SECTION     CESAREAN SECTION     COLONOSCOPY WITH PROPOFOL N/A 06/02/2021   Procedure: COLONOSCOPY WITH PROPOFOL;  Surgeon: Toney Reil, MD;  Location: [facility];  Service: Gastroenterology;  Laterality: N/A;  SPANISH INTERPRETER   LAPAROSCOPIC HYSTERECTOMY     LAPAROSCOPIC OOPHERECTOMY Right    SHOULDER ARTHROSCOPY WITH ROTATOR CUFF REPAIR AND SUBACROMIAL DECOMPRESSION Left 08/22/2021   Procedure: SHOULDER ARTHROSCOPY WITH ROTATOR CUFF REPAIR AND SUBACROMIAL DECOMPRESSION;  Surgeon: Jones Broom, MD;  Location: [facility];  Service: Orthopedics;  Laterality: Left;   Patient Active Problem List   Diagnosis Date Noted   Screen for colon cancer    Muscle cramps 05/28/2018   Pre-diabetes 05/28/2018   Fatty liver 05/28/2018   Hair loss 05/28/2018   Sciatica 11/01/2015   S/P right oophorectomy 11/01/2015   S/P hysterectomy 11/01/2015    PCP: N/A  REFERRING PROVIDER: Jones Broom, MD  REFERRING DIAG:  M25.519 (ICD-10-CM) - Pain in unspecified shoulder    THERAPY DIAG:  Chronic left shoulder pain  Stiffness of left shoulder, not elsewhere classified  Muscle weakness (generalized)  Rationale for Evaluation and Treatment: Rehabilitation  ONSET  DATE: 07/17/2020  SUBJECTIVE:                                                                                                                                                                                      SUBJECTIVE STATEMENT: Pt reports 2/10 NPS in L shoulder.   Pt is accompanied by contracted translator: Marian Sorrow  Hand dominance: Right  PERTINENT HISTORY: Pt is a 62 y/o F w/ chronic bilat shoulder pain LUE> RUE. Pt s/p L shoulder arthroscopy w/ RTC repair and subacromial decompression in November, 2022. Pt's has had ongoing pain since this surgery and has had multiple  bouts of physical therapy. Her previous physical therapy being in July, 2024 was d/c to have further orthopedic specialist treatment of the L shoulder. Pt received a cortisone injection in her L shoulder which decreased her pain mildly, and received a new referral to continue physical therapy. Pt reports increased R shoulder pain 2/2 to overuse to compensate from L shoulder pain/ weakness. Pt reports difficulty with carrying objects and lifting with her left arm. Pt's sx are relieved by ceasing movement, icing the L shoulder and mild relief from corticosteroids injections. Pain at best is 4-5/10 and pain at worst is a 7/10. Pt notes mild N/T traveling down the L arm. Along with, difficulty sleeping on her left side, increasing her pain and must have a pillow under her arm.   PAIN:  Are you having pain? Yes: NPRS scale: 2/10 Pain location: L lateral and anterior shoulder  Pain description: Sharp, stabbing Aggravating factors: Holding something, cooking, lifting arm up  Relieving factors: Steroid injection, ice 4-5/10 NPRS pain at best  PRECAUTIONS: None  RED FLAGS: None   WEIGHT BEARING RESTRICTIONS: No  FALLS:  Has patient fallen in last 6 months? No  OCCUPATION: Unemployed 2/2 L shoulder pain   PLOF: Independent  PATIENT GOALS: To reduce her pain.   NEXT MD VISIT:   OBJECTIVE:  Note: Objective measures  were completed at Evaluation unless otherwise noted.  DIAGNOSTIC FINDINGS:  03/18/2022- IMPRESSION:  Status post left shoulder surgery 08/2021. Left shoulder pain and weakness. Compared to 07/12/2021:   1. Interval anterior supraspinatus tendon repair. The prior high-grade partial-thickness tear is no longer seen. There is moderate to high-grade anterior supraspinatus chronic tendinosis. 2. Moderate anterior infraspinatus and mild superior subscapularis chronic tendinosis. 3. Moderate proximal long head of the biceps tendinosis. 4. Likely interval partial distal clavicle excision and mild acromioplasty. 5. Mild-to-moderate subacromial/subdeltoid bursitis, similar to prior.  PATIENT SURVEYS:  FOTO 38/54    COGNITION: Overall cognitive status: Within functional limits for tasks assessed     SENSATION: UQNS: mild decrease in sensation at C2-C3 and C4-C5 dermatomal pattern   POSTURE: Rounded shoulders noted  UPPER EXTREMITY ROM:  Cervical AROM:       Lateral flexion L- 35 deg R-45 deg*     Rotation L 50 deg R 60 deg*    Extension 20 deg*    Flexion 50 deg   Active ROM Right eval Left eval  Shoulder flexion WFL 105*  Shoulder extension    Shoulder abduction WFL* 91*  Shoulder adduction    Shoulder internal rotation L1 L buttock/65 deg*  Shoulder external rotation T7* C2/35 deg*  Elbow flexion    Elbow extension    Wrist flexion    Wrist extension    Wrist ulnar deviation    Wrist radial deviation    Wrist pronation    Wrist supination    (Blank rows = not tested)  PROM: L IR: 75 deg* L ER: 55 deg*  *denotes pain with movement  PROM: R Shoulder- Flexion and Abduction WFL w/ pain at end range PROM: L Shoulder- Flexion and Abduction limited to 90 degrees w/ pain throughout the motion and pain limiting further assessment.   UPPER EXTREMITY MMT:  MMT Right eval Left eval  Shoulder flexion 3+* 2-*  Shoulder extension    Shoulder abduction 3* 2-*  Shoulder  adduction    Shoulder internal rotation 4- 2-*  Shoulder external rotation 4- 3+*  Middle trapezius    Lower trapezius    Elbow flexion  Elbow extension    Wrist flexion    Wrist extension    Wrist ulnar deviation    Wrist radial deviation    Wrist pronation    Wrist supination    Grip strength (lbs)    (Blank rows = not tested)  *denotes pain with movement  SHOULDER SPECIAL TESTS: Not performed 2/2 to increased irritability of pt's sx.   JOINT MOBILITY TESTING:   No significant hypomobility noted between R shoulder and L shoulder joint. L shoulder noting increased pain with muscle guarding. AP/PA and inferior bilaterally  PALPATION:  Anterior and lateral portion of left shoulder TTP    TODAY'S TREATMENT: DATE: 10/01/23  There.ex:  Standing LUE AAROM shoulder ranger flexion: 3x8 in pain tolerable ranges  Supine dowel chest press 2x8 with 2# AW on dowel  Supine AAROM shoulder flexion: 2x8  Standing grey TB scap retractions: x8 bilat use. Regressed to black TB LUE only. 2x8 with reduced spinal rotation compensation.   Standing bicep curls neutral with concentric, supinated with eccentric portion: 3x8, 2# DB's   L shoulder bicep stretch in doorway: x1, 10 sec. Painful so discontinued.   Seated L elbow supination: 3x6, 1# DB   PATIENT EDUCATION: Education details: HEP given to pt Person educated: Patient Education method: Medical illustrator Education comprehension: verbalized understanding and returned demonstration  HOME EXERCISE PROGRAM:   Access Code: [email or web address] URL: [email or web address] Date: 07/11/2023 Prepared by: Ronnie Derby  Exercises - Seated Upper Trapezius Stretch  - 1 x daily - 5-7 x weekly - 2 sets - 3 reps - 20 hold - Cervical Extension Stretch  - 1 x daily - 5-7 x weekly - 1 sets - 10 reps - 5 hold - Supine Shoulder Flexion Extension AAROM with Dowel  - 1 x daily - 5-7 x weekly - 2 sets - 12 reps - Supine Shoulder  External Rotation with Dowel  - 1 x daily - 5-7 x weekly - 2 sets - 12 reps - Seated Shoulder Abduction Towel Slide at Table Top  - 1 x daily - 5-7 x weekly - 3 sets - 6 reps   ASSESSMENT:  CLINICAL IMPRESSION: Continuing PT POC with focus on progressive L shoulder AAROM overhead and RTC/bicep strength exercises. Pt appears to have bicep mediated pain compared to RTC musculature so will slightly deviate in POC to address bicep loading and tissue calming due to likely tendinopathy. Educated pt to trial gentle supination strengthening in neutral positions. Pt understanding. Pt will continue to benefit from skilled therapy to address remaining deficits in order to improve overall QoL and return to PLOF.   OBJECTIVE IMPAIRMENTS: decreased mobility, decreased ROM, decreased strength, hypomobility, impaired flexibility, impaired UE functional use, and pain.   ACTIVITY LIMITATIONS: carrying, lifting, sleeping, and bed mobility  PARTICIPATION LIMITATIONS: cleaning, laundry, shopping, and community activity  PERSONAL FACTORS: Age, Past/current experiences, and Time since onset of injury/illness/exacerbation are also affecting patient's functional outcome.   REHAB POTENTIAL: Fair chronicity of L shoulder pain  CLINICAL DECISION MAKING: Evolving/moderate complexity  EVALUATION COMPLEXITY: Moderate   GOALS: Goals reviewed with patient? Yes  SHORT TERM GOALS: Target date: 07/30/2023  Pt will be independent with HEP to improve L shoulder strength and decrease pain with functional activities   Baseline:07/02/23: HEP given at today's session. 08/16/23: HEP compliant.  Goal status: MET  LONG TERM GOALS: Target date: 11/14/2023  Pt will improve FOTO to target score to demonstrate clinically significant improvement in functional mobility.  Baseline: 07/02/23: Deferred to next session  07/09/23: 38/54 08/16/23: 78/29; 08/27/23: 56/21; 41/54 Goal status: ONGOING  2.  Pt will decrease pain to <5/10 to  demonstrate clinically significant improvement in pain levels with functional activity and ADL's.  Baseline: 07/02/23: 7/10 NPRS 08/16/23: 7.5-8/10 NPRS; 08/27/23: 7-8/10 NPS.; 09/19/23: 7-8/10 NPS Goal status: ONGOING  3.  Pt will improve all L shoulder AROM to WFL/ to match pt's R shoulder to improve functional mobility of the LUE.  Baseline: 07/02/23:  Active ROM Right eval Left eval Left 08/16/23 (measured in seated) Left 09/19/23 (measured in seated)  Shoulder flexion WFL 105* deg 105* deg 120* degrees  Shoulder extension      Shoulder abduction WFL* 91* 90* deg 91* deg  Shoulder adduction      Shoulder internal rotation L1 L buttock/65 deg* L buttock/75 deg*  85  Shoulder external rotation T7* C2/35 deg* C4/ 60 deg* 45 deg  *pain at end range   Goal status: ONGOING  4.   Pt will self report the ability to sleep on her left arm positioned to pt comfort, w/o increased pain to demonstrate improvements with ability to improve sleep quality.  Baseline: 07/02/23: Unable to sleep on left arm w/o a pillow underneath. 08/16/23: Pt unable to sleep on the L arm w/o a pillow underneath it; 08/27/23: avoids still due to pain.; 09/19/23: has tried sleeping on her L shoulder but does have pain. 8/10 NPS.  Goal status: ONGOING  PLAN:  PT FREQUENCY: 2x/week  PT DURATION: 8 weeks  PLANNED INTERVENTIONS: Therapeutic exercises, Therapeutic activity, Neuromuscular re-education, Balance training, Gait training, Patient/Family education, Self Care, Joint mobilization, Joint manipulation, Spinal manipulation, Spinal mobilization, Cryotherapy, Moist heat, and Re-evaluation   PLAN FOR NEXT SESSION: Gentle bicep loading (supination), Continue to progress L shoulder AROM and strengthening exercises. (Emphasize ext, abduction and flexion AROM of L shoulder).  Delphia Grates. Fairly IV, PT, DPT Physical Therapist- [facility]  10/01/23, 12:05 PM

## 2023-10-11 ENCOUNTER — Ambulatory Visit: Payer: Medicaid Other | Attending: Orthopedic Surgery

## 2023-10-11 DIAGNOSIS — M6281 Muscle weakness (generalized): Secondary | ICD-10-CM | POA: Diagnosis present

## 2023-10-11 DIAGNOSIS — M25612 Stiffness of left shoulder, not elsewhere classified: Secondary | ICD-10-CM | POA: Insufficient documentation

## 2023-10-11 DIAGNOSIS — M25512 Pain in left shoulder: Secondary | ICD-10-CM | POA: Diagnosis present

## 2023-10-11 DIAGNOSIS — G8929 Other chronic pain: Secondary | ICD-10-CM | POA: Diagnosis present

## 2023-10-11 NOTE — Therapy (Signed)
 OUTPATIENT PHYSICAL THERAPY SHOULDER TREATMENT   Patient Name: Carolyn Sampson MRN: [id number] DOB:Nov 30, 1960, 62 y.o., female Today's Date: 10/11/2023  END OF SESSION:  PT End of Session - 10/11/23 1628     Visit Number 18    Number of Visits 32    Date for PT Re-Evaluation 11/14/23    PT Start Time 1620    PT Stop Time 1645    PT Time Calculation (min) 25 min    Activity Tolerance Patient limited by pain;Patient tolerated treatment well    Behavior During Therapy [facility] for tasks assessed/performed             Past Medical History:  Diagnosis Date   Fatty liver    Pre-diabetes    Past Surgical History:  Procedure Laterality Date   ABDOMINAL HYSTERECTOMY     APPENDECTOMY     CESAREAN SECTION     CESAREAN SECTION     COLONOSCOPY WITH PROPOFOL  N/A 06/02/2021   Procedure: COLONOSCOPY WITH PROPOFOL ;  Surgeon: Unk Corinn Skiff, MD;  Location: [facility];  Service: Gastroenterology;  Laterality: N/A;  SPANISH INTERPRETER   LAPAROSCOPIC HYSTERECTOMY     LAPAROSCOPIC OOPHERECTOMY Right    SHOULDER ARTHROSCOPY WITH ROTATOR CUFF REPAIR AND SUBACROMIAL DECOMPRESSION Left 08/22/2021   Procedure: SHOULDER ARTHROSCOPY WITH ROTATOR CUFF REPAIR AND SUBACROMIAL DECOMPRESSION;  Surgeon: Dozier Soulier, MD;  Location: [facility];  Service: Orthopedics;  Laterality: Left;   Patient Active Problem List   Diagnosis Date Noted   Screen for colon cancer    Muscle cramps 05/28/2018   Pre-diabetes 05/28/2018   Fatty liver 05/28/2018   Hair loss 05/28/2018   Sciatica 11/01/2015   S/P right oophorectomy 11/01/2015   S/P hysterectomy 11/01/2015    PCP: N/A  REFERRING PROVIDER: Soulier Dozier, MD  REFERRING DIAG:  M25.519 (ICD-10-CM) - Pain in unspecified shoulder    THERAPY DIAG:  Chronic left shoulder pain  Stiffness of left shoulder, not elsewhere classified  Muscle weakness (generalized)  Rationale for Evaluation and Treatment: Rehabilitation  ONSET  DATE: 07/17/2020  SUBJECTIVE:                                                                                                                                                                                      SUBJECTIVE STATEMENT: Pt reports no pain currently. Has been working on her updated HEP.   Pt is accompanied by contracted translator: [name]  Hand dominance: Right  PERTINENT HISTORY: Pt is a 63 y/o F w/ chronic bilat shoulder pain LUE> RUE. Pt s/p L shoulder arthroscopy w/ RTC repair and subacromial decompression in November, 2022. Pt's has had ongoing pain since this  surgery and has had multiple bouts of physical therapy. Her previous physical therapy being in July, 2024 was d/c to have further orthopedic specialist treatment of the L shoulder. Pt received a cortisone injection in her L shoulder which decreased her pain mildly, and received a new referral to continue physical therapy. Pt reports increased R shoulder pain 2/2 to overuse to compensate from L shoulder pain/ weakness. Pt reports difficulty with carrying objects and lifting with her left arm. Pt's sx are relieved by ceasing movement, icing the L shoulder and mild relief from corticosteroids injections. Pain at best is 4-5/10 and pain at worst is a 7/10. Pt notes mild N/T traveling down the L arm. Along with, difficulty sleeping on her left side, increasing her pain and must have a pillow under her arm.   PAIN:  Are you having pain? Yes: NPRS scale: 2/10 Pain location: L lateral and anterior shoulder  Pain description: Sharp, stabbing Aggravating factors: Holding something, cooking, lifting arm up  Relieving factors: Steroid injection, ice 4-5/10 NPRS pain at best  PRECAUTIONS: None  RED FLAGS: None   WEIGHT BEARING RESTRICTIONS: No  FALLS:  Has patient fallen in last 6 months? No  OCCUPATION: Unemployed 2/2 L shoulder pain   PLOF: Independent  PATIENT GOALS: To reduce her pain.   NEXT MD VISIT:    OBJECTIVE:  Note: Objective measures were completed at Evaluation unless otherwise noted.  DIAGNOSTIC FINDINGS:  03/18/2022- IMPRESSION:  Status post left shoulder surgery 08/2021. Left shoulder pain and weakness. Compared to 07/12/2021:   1. Interval anterior supraspinatus tendon repair. The prior high-grade partial-thickness tear is no longer seen. There is moderate to high-grade anterior supraspinatus chronic tendinosis. 2. Moderate anterior infraspinatus and mild superior subscapularis chronic tendinosis. 3. Moderate proximal long head of the biceps tendinosis. 4. Likely interval partial distal clavicle excision and mild acromioplasty. 5. Mild-to-moderate subacromial/subdeltoid bursitis, similar to prior.  PATIENT SURVEYS:  FOTO 38/54    COGNITION: Overall cognitive status: Within functional limits for tasks assessed     SENSATION: UQNS: mild decrease in sensation at C2-C3 and C4-C5 dermatomal pattern   POSTURE: Rounded shoulders noted  UPPER EXTREMITY ROM:  Cervical AROM:       Lateral flexion L- 35 deg R-45 deg*     Rotation L 50 deg R 60 deg*    Extension 20 deg*    Flexion 50 deg   Active ROM Right eval Left eval  Shoulder flexion WFL 105*  Shoulder extension    Shoulder abduction WFL* 91*  Shoulder adduction    Shoulder internal rotation L1 L buttock/65 deg*  Shoulder external rotation T7* C2/35 deg*  Elbow flexion    Elbow extension    Wrist flexion    Wrist extension    Wrist ulnar deviation    Wrist radial deviation    Wrist pronation    Wrist supination    (Blank rows = not tested)  PROM: L IR: 75 deg* L ER: 55 deg*  *denotes pain with movement  PROM: R Shoulder- Flexion and Abduction WFL w/ pain at end range PROM: L Shoulder- Flexion and Abduction limited to 90 degrees w/ pain throughout the motion and pain limiting further assessment.   UPPER EXTREMITY MMT:  MMT Right eval Left eval  Shoulder flexion 3+* 2-*  Shoulder extension     Shoulder abduction 3* 2-*  Shoulder adduction    Shoulder internal rotation 4- 2-*  Shoulder external rotation 4- 3+*  Middle trapezius    Lower trapezius  Elbow flexion    Elbow extension    Wrist flexion    Wrist extension    Wrist ulnar deviation    Wrist radial deviation    Wrist pronation    Wrist supination    Grip strength (lbs)    (Blank rows = not tested)  *denotes pain with movement  SHOULDER SPECIAL TESTS: Not performed 2/2 to increased irritability of pt's sx.   JOINT MOBILITY TESTING:   No significant hypomobility noted between R shoulder and L shoulder joint. L shoulder noting increased pain with muscle guarding. AP/PA and inferior bilaterally  PALPATION:  Anterior and lateral portion of left shoulder TTP    TODAY'S TREATMENT: DATE: 10/11/23  There.ex:  Standing LUE AAROM shoulder ranger flexion: 3x8 in pain tolerable ranges  R side lying L shoulder ER: 3x8. VC's for form/technique.   Standing bicep curls neutral with concentric, supinated with eccentric portion: 3x12, 1# DB's   Seated L elbow supination: 3x6, 1# DB.   PATIENT EDUCATION: Education details: HEP given to pt Person educated: Patient Education method: Medical Illustrator Education comprehension: verbalized understanding and returned demonstration  HOME EXERCISE PROGRAM:   Access Code: [email or web address] URL: [email or web address] Date: 07/11/2023 Prepared by: Dorina Kingfisher  Exercises - Seated Upper Trapezius Stretch  - 1 x daily - 5-7 x weekly - 2 sets - 3 reps - 20 hold - Cervical Extension Stretch  - 1 x daily - 5-7 x weekly - 1 sets - 10 reps - 5 hold - Supine Shoulder Flexion Extension AAROM with Dowel  - 1 x daily - 5-7 x weekly - 2 sets - 12 reps - Supine Shoulder External Rotation with Dowel  - 1 x daily - 5-7 x weekly - 2 sets - 12 reps - Seated Shoulder Abduction Towel Slide at Table Top  - 1 x daily - 5-7 x weekly - 3 sets - 6  reps   ASSESSMENT:  CLINICAL IMPRESSION: Continuing PT POC with focus on progressive L shoulder AAROM overhead and RTC/bicep strength exercises. Pt late to session thus greatly limited in treatment time. Pt continues to have pain with RTC strength and overhead performance reliant on AAROM for greater ranges. Albeit seems to be less severe than previously in POC. Pt with good carryover in bicep loading exercises today. Encouraged to attempt at home if L shoulder pain allows. Pt will continue to benefit from skilled therapy to address remaining deficits in order to improve overall QoL and return to PLOF.   OBJECTIVE IMPAIRMENTS: decreased mobility, decreased ROM, decreased strength, hypomobility, impaired flexibility, impaired UE functional use, and pain.   ACTIVITY LIMITATIONS: carrying, lifting, sleeping, and bed mobility  PARTICIPATION LIMITATIONS: cleaning, laundry, shopping, and community activity  PERSONAL FACTORS: Age, Past/current experiences, and Time since onset of injury/illness/exacerbation are also affecting patient's functional outcome.   REHAB POTENTIAL: Fair chronicity of L shoulder pain  CLINICAL DECISION MAKING: Evolving/moderate complexity  EVALUATION COMPLEXITY: Moderate   GOALS: Goals reviewed with patient? Yes  SHORT TERM GOALS: Target date: 07/30/2023  Pt will be independent with HEP to improve L shoulder strength and decrease pain with functional activities   Baseline:07/02/23: HEP given at today's session. 08/16/23: HEP compliant.  Goal status: MET  LONG TERM GOALS: Target date: 11/14/2023  Pt will improve FOTO to target score to demonstrate clinically significant improvement in functional mobility.  Baseline: 07/02/23: Deferred to next session 07/09/23: 38/54 08/16/23: 41/54; 08/27/23: 55/45; 41/54 Goal status: ONGOING  2.  Pt will decrease pain to <5/10 to  demonstrate clinically significant improvement in pain levels with functional activity and ADL's.   Baseline: 07/02/23: 7/10 NPRS 08/16/23: 7.5-8/10 NPRS; 08/27/23: 7-8/10 NPS.; 09/19/23: 7-8/10 NPS Goal status: ONGOING  3.  Pt will improve all L shoulder AROM to WFL/ to match pt's R shoulder to improve functional mobility of the LUE.  Baseline: 07/02/23:  Active ROM Right eval Left eval Left 08/16/23 (measured in seated) Left 09/19/23 (measured in seated)  Shoulder flexion WFL 105* deg 105* deg 120* degrees  Shoulder extension      Shoulder abduction WFL* 91* 90* deg 91* deg  Shoulder adduction      Shoulder internal rotation L1 L buttock/65 deg* L buttock/75 deg*  85  Shoulder external rotation T7* C2/35 deg* C4/ 60 deg* 45 deg  *pain at end range   Goal status: ONGOING  4.   Pt will self report the ability to sleep on her left arm positioned to pt comfort, w/o increased pain to demonstrate improvements with ability to improve sleep quality.  Baseline: 07/02/23: Unable to sleep on left arm w/o a pillow underneath. 08/16/23: Pt unable to sleep on the L arm w/o a pillow underneath it; 08/27/23: avoids still due to pain.; 09/19/23: has tried sleeping on her L shoulder but does have pain. 8/10 NPS.  Goal status: ONGOING  PLAN:  PT FREQUENCY: 2x/week  PT DURATION: 8 weeks  PLANNED INTERVENTIONS: Therapeutic exercises, Therapeutic activity, Neuromuscular re-education, Balance training, Gait training, Patient/Family education, Self Care, Joint mobilization, Joint manipulation, Spinal manipulation, Spinal mobilization, Cryotherapy, Moist heat, and Re-evaluation   PLAN FOR NEXT SESSION: Gentle bicep loading (supination), Continue to progress L shoulder AROM and strengthening exercises. (Emphasize ext, abduction and flexion AROM of L shoulder).  Dorina HERO. Fairly IV, PT, DPT Physical Therapist- [place]  [facility]  10/11/23, 4:51 PM

## 2023-10-22 ENCOUNTER — Ambulatory Visit: Payer: Medicaid Other

## 2023-10-22 DIAGNOSIS — M25612 Stiffness of left shoulder, not elsewhere classified: Secondary | ICD-10-CM

## 2023-10-22 DIAGNOSIS — M25512 Pain in left shoulder: Secondary | ICD-10-CM | POA: Diagnosis not present

## 2023-10-22 DIAGNOSIS — M6281 Muscle weakness (generalized): Secondary | ICD-10-CM

## 2023-10-22 DIAGNOSIS — G8929 Other chronic pain: Secondary | ICD-10-CM

## 2023-10-22 NOTE — Therapy (Signed)
OUTPATIENT PHYSICAL THERAPY SHOULDER TREATMENT   Patient Name: Carolyn Sampson MRN: [id number] DOB:May 05, 1961, 63 y.o., female Today's Date: 10/22/2023  END OF SESSION:  PT End of Session - 10/22/23 1256     Visit Number 19    Number of Visits 32    Date for PT Re-Evaluation 11/14/23    PT Start Time 1300    PT Stop Time 1345    PT Time Calculation (min) 45 min    Activity Tolerance Patient limited by pain;Patient tolerated treatment well    Behavior During Therapy [facility] for tasks assessed/performed             Past Medical History:  Diagnosis Date   Fatty liver    Pre-diabetes    Past Surgical History:  Procedure Laterality Date   ABDOMINAL HYSTERECTOMY     APPENDECTOMY     CESAREAN SECTION     CESAREAN SECTION     COLONOSCOPY WITH PROPOFOL N/A 06/02/2021   Procedure: COLONOSCOPY WITH PROPOFOL;  Surgeon: Toney Reil, MD;  Location: [facility];  Service: Gastroenterology;  Laterality: N/A;  SPANISH INTERPRETER   LAPAROSCOPIC HYSTERECTOMY     LAPAROSCOPIC OOPHERECTOMY Right    SHOULDER ARTHROSCOPY WITH ROTATOR CUFF REPAIR AND SUBACROMIAL DECOMPRESSION Left 08/22/2021   Procedure: SHOULDER ARTHROSCOPY WITH ROTATOR CUFF REPAIR AND SUBACROMIAL DECOMPRESSION;  Surgeon: Jones Broom, MD;  Location: [facility];  Service: Orthopedics;  Laterality: Left;   Patient Active Problem List   Diagnosis Date Noted   Screen for colon cancer    Muscle cramps 05/28/2018   Pre-diabetes 05/28/2018   Fatty liver 05/28/2018   Hair loss 05/28/2018   Sciatica 11/01/2015   S/P right oophorectomy 11/01/2015   S/P hysterectomy 11/01/2015    PCP: N/A  REFERRING PROVIDER: Jones Broom, MD  REFERRING DIAG:  M25.519 (ICD-10-CM) - Pain in unspecified shoulder    THERAPY DIAG:  Chronic left shoulder pain  Stiffness of left shoulder, not elsewhere classified  Muscle weakness (generalized)  Rationale for Evaluation and Treatment: Rehabilitation  ONSET  DATE: 07/17/2020  SUBJECTIVE:                                                                                                                                                                                      SUBJECTIVE STATEMENT: Pt reports pain 1-1.5/10 NPS. Has been compliant with new HEP.   Pt is accompanied by contracted translator: Milly  Hand dominance: Right  PERTINENT HISTORY: Pt is a 63 y/o F w/ chronic bilat shoulder pain LUE> RUE. Pt s/p L shoulder arthroscopy w/ RTC repair and subacromial decompression in November, 2022. Pt's has had ongoing pain since this surgery  and has had multiple bouts of physical therapy. Her previous physical therapy being in July, 2024 was d/c to have further orthopedic specialist treatment of the L shoulder. Pt received a cortisone injection in her L shoulder which decreased her pain mildly, and received a new referral to continue physical therapy. Pt reports increased R shoulder pain 2/2 to overuse to compensate from L shoulder pain/ weakness. Pt reports difficulty with carrying objects and lifting with her left arm. Pt's sx are relieved by ceasing movement, icing the L shoulder and mild relief from corticosteroids injections. Pain at best is 4-5/10 and pain at worst is a 7/10. Pt notes mild N/T traveling down the L arm. Along with, difficulty sleeping on her left side, increasing her pain and must have a pillow under her arm.   PAIN:  Are you having pain? Yes: NPRS scale: 1-1.5/10 Pain location: L lateral and anterior shoulder  Pain description: Sharp, stabbing Aggravating factors: Holding something, cooking, lifting arm up  Relieving factors: Steroid injection, ice 4-5/10 NPRS pain at best  PRECAUTIONS: None  RED FLAGS: None   WEIGHT BEARING RESTRICTIONS: No  FALLS:  Has patient fallen in last 6 months? No  OCCUPATION: Unemployed 2/2 L shoulder pain   PLOF: Independent  PATIENT GOALS: To reduce her pain.   NEXT MD VISIT:    OBJECTIVE:  Note: Objective measures were completed at Evaluation unless otherwise noted.  DIAGNOSTIC FINDINGS:  03/18/2022- IMPRESSION:  Status post left shoulder surgery 08/2021. Left shoulder pain and weakness. Compared to 07/12/2021:   1. Interval anterior supraspinatus tendon repair. The prior high-grade partial-thickness tear is no longer seen. There is moderate to high-grade anterior supraspinatus chronic tendinosis. 2. Moderate anterior infraspinatus and mild superior subscapularis chronic tendinosis. 3. Moderate proximal long head of the biceps tendinosis. 4. Likely interval partial distal clavicle excision and mild acromioplasty. 5. Mild-to-moderate subacromial/subdeltoid bursitis, similar to prior.  PATIENT SURVEYS:  FOTO 38/54    COGNITION: Overall cognitive status: Within functional limits for tasks assessed     SENSATION: UQNS: mild decrease in sensation at C2-C3 and C4-C5 dermatomal pattern   POSTURE: Rounded shoulders noted  UPPER EXTREMITY ROM:  Cervical AROM:       Lateral flexion L- 35 deg R-45 deg*     Rotation L 50 deg R 60 deg*    Extension 20 deg*    Flexion 50 deg   Active ROM Right eval Left eval  Shoulder flexion WFL 105*  Shoulder extension    Shoulder abduction WFL* 91*  Shoulder adduction    Shoulder internal rotation L1 L buttock/65 deg*  Shoulder external rotation T7* C2/35 deg*  Elbow flexion    Elbow extension    Wrist flexion    Wrist extension    Wrist ulnar deviation    Wrist radial deviation    Wrist pronation    Wrist supination    (Blank rows = not tested)  PROM: L IR: 75 deg* L ER: 55 deg*  *denotes pain with movement  PROM: R Shoulder- Flexion and Abduction WFL w/ pain at end range PROM: L Shoulder- Flexion and Abduction limited to 90 degrees w/ pain throughout the motion and pain limiting further assessment.   UPPER EXTREMITY MMT:  MMT Right eval Left eval  Shoulder flexion 3+* 2-*  Shoulder extension     Shoulder abduction 3* 2-*  Shoulder adduction    Shoulder internal rotation 4- 2-*  Shoulder external rotation 4- 3+*  Middle trapezius    Lower trapezius  Elbow flexion    Elbow extension    Wrist flexion    Wrist extension    Wrist ulnar deviation    Wrist radial deviation    Wrist pronation    Wrist supination    Grip strength (lbs)    (Blank rows = not tested)  *denotes pain with movement  SHOULDER SPECIAL TESTS: Not performed 2/2 to increased irritability of pt's sx.   JOINT MOBILITY TESTING:   No significant hypomobility noted between R shoulder and L shoulder joint. L shoulder noting increased pain with muscle guarding. AP/PA and inferior bilaterally  PALPATION:  Anterior and lateral portion of left shoulder TTP    TODAY'S TREATMENT: DATE: 10/22/23  There.ex:  Standing LUE AAROM shoulder ranger flexion: 3x8 in pain tolerable ranges  R side lying L shoulder ER: 3x6. VC's for form/technique.   R side lying L shoulder flexion with scapular assist from PT. Improved pain with elevation. 2x8   Standing bicep curls neutral with concentric, supinated with eccentric portion: 3x12, 2# DB's   Seated L elbow supination: 3x6, 2# DB.   PATIENT EDUCATION: Education details: HEP given to pt Person educated: Patient Education method: Medical illustrator Education comprehension: verbalized understanding and returned demonstration  HOME EXERCISE PROGRAM:   Access Code: [email or web address] URL: [email or web address] Date: 07/11/2023 Prepared by: Ronnie Derby  Exercises - Seated Upper Trapezius Stretch  - 1 x daily - 5-7 x weekly - 2 sets - 3 reps - 20 hold - Cervical Extension Stretch  - 1 x daily - 5-7 x weekly - 1 sets - 10 reps - 5 hold - Supine Shoulder Flexion Extension AAROM with Dowel  - 1 x daily - 5-7 x weekly - 2 sets - 12 reps - Supine Shoulder External Rotation with Dowel  - 1 x daily - 5-7 x weekly - 2 sets - 12 reps - Seated Shoulder  Abduction Towel Slide at Table Top  - 1 x daily - 5-7 x weekly - 3 sets - 6 reps   ASSESSMENT:  CLINICAL IMPRESSION: Continuing PT POC with focus on progressive L shoulder AAROM overhead and RTC/bicep strength exercises. Pt does report improvement in L shoulder ER and flexion with scapular assist indicative of periscapular weakness contributing to pt's symptoms. Pt is able to progress in LUE supination and elbow flexion for likely biceps tendinitis mediated type pain. Encouraged progression at home with HEP. Pt understanding. Pt continues to remain limited in strength due to pain with overhead L shoulder ADL's. Pt will continue to benefit from skilled therapy to address remaining deficits in order to improve overall QoL and return to PLOF.   OBJECTIVE IMPAIRMENTS: decreased mobility, decreased ROM, decreased strength, hypomobility, impaired flexibility, impaired UE functional use, and pain.   ACTIVITY LIMITATIONS: carrying, lifting, sleeping, and bed mobility  PARTICIPATION LIMITATIONS: cleaning, laundry, shopping, and community activity  PERSONAL FACTORS: Age, Past/current experiences, and Time since onset of injury/illness/exacerbation are also affecting patient's functional outcome.   REHAB POTENTIAL: Fair chronicity of L shoulder pain  CLINICAL DECISION MAKING: Evolving/moderate complexity  EVALUATION COMPLEXITY: Moderate   GOALS: Goals reviewed with patient? Yes  SHORT TERM GOALS: Target date: 07/30/2023  Pt will be independent with HEP to improve L shoulder strength and decrease pain with functional activities   Baseline:07/02/23: HEP given at today's session. 08/16/23: HEP compliant.  Goal status: MET  LONG TERM GOALS: Target date: 11/14/2023  Pt will improve FOTO to target score to demonstrate clinically significant improvement in functional mobility.  Baseline: 07/02/23:  Deferred to next session 07/09/23: 38/54 08/16/23: 41/54; 08/27/23: 01/02; 41/54 Goal status:  ONGOING  2.  Pt will decrease pain to <5/10 to demonstrate clinically significant improvement in pain levels with functional activity and ADL's.  Baseline: 07/02/23: 7/10 NPRS 08/16/23: 7.5-8/10 NPRS; 08/27/23: 7-8/10 NPS.; 09/19/23: 7-8/10 NPS Goal status: ONGOING  3.  Pt will improve all L shoulder AROM to WFL/ to match pt's R shoulder to improve functional mobility of the LUE.  Baseline: 07/02/23:  Active ROM Right eval Left eval Left 08/16/23 (measured in seated) Left 09/19/23 (measured in seated)  Shoulder flexion WFL 105* deg 105* deg 120* degrees  Shoulder extension      Shoulder abduction WFL* 91* 90* deg 91* deg  Shoulder adduction      Shoulder internal rotation L1 L buttock/65 deg* L buttock/75 deg*  85  Shoulder external rotation T7* C2/35 deg* C4/ 60 deg* 45 deg  *pain at end range   Goal status: ONGOING  4.   Pt will self report the ability to sleep on her left arm positioned to pt comfort, w/o increased pain to demonstrate improvements with ability to improve sleep quality.  Baseline: 07/02/23: Unable to sleep on left arm w/o a pillow underneath. 08/16/23: Pt unable to sleep on the L arm w/o a pillow underneath it; 08/27/23: avoids still due to pain.; 09/19/23: has tried sleeping on her L shoulder but does have pain. 8/10 NPS.  Goal status: ONGOING  PLAN:  PT FREQUENCY: 2x/week  PT DURATION: 8 weeks  PLANNED INTERVENTIONS: Therapeutic exercises, Therapeutic activity, Neuromuscular re-education, Balance training, Gait training, Patient/Family education, Self Care, Joint mobilization, Joint manipulation, Spinal manipulation, Spinal mobilization, Cryotherapy, Moist heat, and Re-evaluation   PLAN FOR NEXT SESSION: Gentle bicep loading (supination), Continue to progress L shoulder AROM and strengthening exercises. (Emphasize ext, abduction and flexion AROM of L shoulder).  Delphia Grates. Fairly IV, PT, DPT Physical Therapist- [place]  [facility]   10/22/23, 1:51 PM

## 2023-10-29 ENCOUNTER — Ambulatory Visit: Payer: Medicaid Other

## 2023-10-29 DIAGNOSIS — M6281 Muscle weakness (generalized): Secondary | ICD-10-CM

## 2023-10-29 DIAGNOSIS — M25612 Stiffness of left shoulder, not elsewhere classified: Secondary | ICD-10-CM

## 2023-10-29 DIAGNOSIS — G8929 Other chronic pain: Secondary | ICD-10-CM

## 2023-10-29 DIAGNOSIS — M25512 Pain in left shoulder: Secondary | ICD-10-CM | POA: Diagnosis not present

## 2023-10-29 NOTE — Therapy (Signed)
OUTPATIENT PHYSICAL THERAPY SHOULDER TREATMENT/PROGRESS NOTE  Dates of Reporting Period: 08/16/23 - 10/29/23   Patient Name: Carolyn Sampson MRN: [id number] DOB:08-18-1961, 63 y.o., female Today's Date: 10/29/2023  END OF SESSION:  PT End of Session - 10/29/23 1521     Visit Number 20    Number of Visits 32    Date for PT Re-Evaluation 11/14/23    PT Start Time 1515    PT Stop Time 1600    PT Time Calculation (min) 45 min    Activity Tolerance Patient limited by pain;Patient tolerated treatment well    Behavior During Therapy [facility] for tasks assessed/performed             Past Medical History:  Diagnosis Date   Fatty liver    Pre-diabetes    Past Surgical History:  Procedure Laterality Date   ABDOMINAL HYSTERECTOMY     APPENDECTOMY     CESAREAN SECTION     CESAREAN SECTION     COLONOSCOPY WITH PROPOFOL N/A 06/02/2021   Procedure: COLONOSCOPY WITH PROPOFOL;  Surgeon: Toney Reil, MD;  Location: [facility];  Service: Gastroenterology;  Laterality: N/A;  SPANISH INTERPRETER   LAPAROSCOPIC HYSTERECTOMY     LAPAROSCOPIC OOPHERECTOMY Right    SHOULDER ARTHROSCOPY WITH ROTATOR CUFF REPAIR AND SUBACROMIAL DECOMPRESSION Left 08/22/2021   Procedure: SHOULDER ARTHROSCOPY WITH ROTATOR CUFF REPAIR AND SUBACROMIAL DECOMPRESSION;  Surgeon: Jones Broom, MD;  Location: [facility];  Service: Orthopedics;  Laterality: Left;   Patient Active Problem List   Diagnosis Date Noted   Screen for colon cancer    Muscle cramps 05/28/2018   Pre-diabetes 05/28/2018   Fatty liver 05/28/2018   Hair loss 05/28/2018   Sciatica 11/01/2015   S/P right oophorectomy 11/01/2015   S/P hysterectomy 11/01/2015    PCP: N/A  REFERRING PROVIDER: Jones Broom, MD  REFERRING DIAG:  M25.519 (ICD-10-CM) - Pain in unspecified shoulder    THERAPY DIAG:  Chronic left shoulder pain  Stiffness of left shoulder, not elsewhere classified  Muscle weakness  (generalized)  Rationale for Evaluation and Treatment: Rehabilitation  ONSET DATE: 07/17/2020  SUBJECTIVE:                                                                                                                                                                                      SUBJECTIVE STATEMENT: Pt reports pain 1/10 NPS.  Pt is accompanied by contracted translator: [name]  Hand dominance: Right  PERTINENT HISTORY: Pt is a 63 y/o F w/ chronic bilat shoulder pain LUE> RUE. Pt s/p L shoulder arthroscopy w/ RTC repair and subacromial decompression in November, 2022. Pt's has had ongoing pain since  this surgery and has had multiple bouts of physical therapy. Her previous physical therapy being in July, 2024 was d/c to have further orthopedic specialist treatment of the L shoulder. Pt received a cortisone injection in her L shoulder which decreased her pain mildly, and received a new referral to continue physical therapy. Pt reports increased R shoulder pain 2/2 to overuse to compensate from L shoulder pain/ weakness. Pt reports difficulty with carrying objects and lifting with her left arm. Pt's sx are relieved by ceasing movement, icing the L shoulder and mild relief from corticosteroids injections. Pain at best is 4-5/10 and pain at worst is a 7/10. Pt notes mild N/T traveling down the L arm. Along with, difficulty sleeping on her left side, increasing her pain and must have a pillow under her arm.   PAIN:  Are you having pain? Yes: NPRS scale: 1-1.5/10 Pain location: L lateral and anterior shoulder  Pain description: Sharp, stabbing Aggravating factors: Holding something, cooking, lifting arm up  Relieving factors: Steroid injection, ice 4-5/10 NPRS pain at best  PRECAUTIONS: None  RED FLAGS: None   WEIGHT BEARING RESTRICTIONS: No  FALLS:  Has patient fallen in last 6 months? No  OCCUPATION: Unemployed 2/2 L shoulder pain   PLOF: Independent  PATIENT GOALS: To reduce  her pain.   NEXT MD VISIT:   OBJECTIVE:  Note: Objective measures were completed at Evaluation unless otherwise noted.  DIAGNOSTIC FINDINGS:  03/18/2022- IMPRESSION:  Status post left shoulder surgery 08/2021. Left shoulder pain and weakness. Compared to 07/12/2021:   1. Interval anterior supraspinatus tendon repair. The prior high-grade partial-thickness tear is no longer seen. There is moderate to high-grade anterior supraspinatus chronic tendinosis. 2. Moderate anterior infraspinatus and mild superior subscapularis chronic tendinosis. 3. Moderate proximal long head of the biceps tendinosis. 4. Likely interval partial distal clavicle excision and mild acromioplasty. 5. Mild-to-moderate subacromial/subdeltoid bursitis, similar to prior.  PATIENT SURVEYS:  FOTO 38/54    COGNITION: Overall cognitive status: Within functional limits for tasks assessed     SENSATION: UQNS: mild decrease in sensation at C2-C3 and C4-C5 dermatomal pattern   POSTURE: Rounded shoulders noted  UPPER EXTREMITY ROM:  Cervical AROM:       Lateral flexion L- 35 deg R-45 deg*     Rotation L 50 deg R 60 deg*    Extension 20 deg*    Flexion 50 deg   Active ROM Right eval Left eval  Shoulder flexion WFL 105*  Shoulder extension    Shoulder abduction WFL* 91*  Shoulder adduction    Shoulder internal rotation L1 L buttock/65 deg*  Shoulder external rotation T7* C2/35 deg*  Elbow flexion    Elbow extension    Wrist flexion    Wrist extension    Wrist ulnar deviation    Wrist radial deviation    Wrist pronation    Wrist supination    (Blank rows = not tested)  PROM: L IR: 75 deg* L ER: 55 deg*  *denotes pain with movement  PROM: R Shoulder- Flexion and Abduction WFL w/ pain at end range PROM: L Shoulder- Flexion and Abduction limited to 90 degrees w/ pain throughout the motion and pain limiting further assessment.   UPPER EXTREMITY MMT:  MMT Right eval Left eval  Shoulder flexion  3+* 2-*  Shoulder extension    Shoulder abduction 3* 2-*  Shoulder adduction    Shoulder internal rotation 4- 2-*  Shoulder external rotation 4- 3+*  Middle trapezius    Lower  trapezius    Elbow flexion    Elbow extension    Wrist flexion    Wrist extension    Wrist ulnar deviation    Wrist radial deviation    Wrist pronation    Wrist supination    Grip strength (lbs)    (Blank rows = not tested)  *denotes pain with movement  SHOULDER SPECIAL TESTS: Not performed 2/2 to increased irritability of pt's sx.   JOINT MOBILITY TESTING:   No significant hypomobility noted between R shoulder and L shoulder joint. L shoulder noting increased pain with muscle guarding. AP/PA and inferior bilaterally  PALPATION:  Anterior and lateral portion of left shoulder TTP    TODAY'S TREATMENT: DATE: 10/29/23  There.ex:  UBE 2 min forward, 2 min backward for shoulder warm up. Not billed.   FOTO for progress note: 44/54  Seated L shoulder rhythmic stabilizations: unable to maintain arm elevated at 90 degf or greater to complete.   Seated B shoulder flexion AAROM with 1# AW on dowel: x12   Seated LUE elbow: 3x8, 2# DB   Seated L elbow supination: 3x6, 2# DB  R side lying L shoulder ER: 3x6. VC's for form/technique.    Manual Therapy in R side lying: 8 minutes  STM and TPR techniques to L teres major, minor, and infraspinatus for reduced pain, muscular tension, and improved overhead shoulder mechanics. Pt endorse pain relief with overhead motion AAROM post session   X8 seated B shoulder flexion AAROM with dowel. Improved ROM overhead with reduced pain levels.     PATIENT EDUCATION: Education details: HEP given to pt Person educated: Patient Education method: Medical illustrator Education comprehension: verbalized understanding and returned demonstration  HOME EXERCISE PROGRAM:   Access Code: [email or web address] URL: [email or web address] Date:  07/11/2023 Prepared by: Ronnie Derby  Exercises - Seated Upper Trapezius Stretch  - 1 x daily - 5-7 x weekly - 2 sets - 3 reps - 20 hold - Cervical Extension Stretch  - 1 x daily - 5-7 x weekly - 1 sets - 10 reps - 5 hold - Supine Shoulder Flexion Extension AAROM with Dowel  - 1 x daily - 5-7 x weekly - 2 sets - 12 reps - Supine Shoulder External Rotation with Dowel  - 1 x daily - 5-7 x weekly - 2 sets - 12 reps - Seated Shoulder Abduction Towel Slide at Table Top  - 1 x daily - 5-7 x weekly - 3 sets - 6 reps   ASSESSMENT:  CLINICAL IMPRESSION: Pt on 20th visit warranting progress note. Pt remains having moderate to severe pain with overhead motion reliant on AAROM only. Unable to tolerate AROM beyond 90 degrees actively. Pt has slightly improved FOTO but is generally at a plateaued point compared to prior readings. Unable to greatly progress AAROM either with resistance due to moderate pain. Greatly improved mobility using gravity only as resistance. STM techniques to primarily L shoulder external rotators with notable improvements made with overhead AAROM with reduced pain levels. With further investigate manual interventions with AROM to assess benefits. Pt continues to remain limited in strength due to pain with overhead L shoulder ADL's. Pt will continue to benefit from skilled therapy to address remaining deficits in order to improve overall QoL and return to PLOF.   OBJECTIVE IMPAIRMENTS: decreased mobility, decreased ROM, decreased strength, hypomobility, impaired flexibility, impaired UE functional use, and pain.   ACTIVITY LIMITATIONS: carrying, lifting, sleeping, and bed mobility  PARTICIPATION LIMITATIONS: cleaning, laundry, shopping, and community  activity  PERSONAL FACTORS: Age, Past/current experiences, and Time since onset of injury/illness/exacerbation are also affecting patient's functional outcome.   REHAB POTENTIAL: Fair chronicity of L shoulder pain  CLINICAL DECISION  MAKING: Evolving/moderate complexity  EVALUATION COMPLEXITY: Moderate   GOALS: Goals reviewed with patient? Yes  SHORT TERM GOALS: Target date: 07/30/2023  Pt will be independent with HEP to improve L shoulder strength and decrease pain with functional activities   Baseline:07/02/23: HEP given at today's session. 08/16/23: HEP compliant.  Goal status: MET  LONG TERM GOALS: Target date: 11/14/2023  Pt will improve FOTO to target score to demonstrate clinically significant improvement in functional mobility.  Baseline: 07/02/23: Deferred to next session 07/09/23: 38/54 08/16/23: 41/54; 08/27/23: 16/10; 41/54; 10/29/23: 44/54 Goal status: ONGOING  2.  Pt will decrease pain to <5/10 to demonstrate clinically significant improvement in pain levels with functional activity and ADL's.  Baseline: 07/02/23: 7/10 NPRS 08/16/23: 7.5-8/10 NPRS; 08/27/23: 7-8/10 NPS.; 09/19/23: 7-8/10 NPS Goal status: ONGOING  3.  Pt will improve all L shoulder AROM to WFL/ to match pt's R shoulder to improve functional mobility of the LUE.  Baseline: 07/02/23:  Active ROM Right eval Left eval Left 08/16/23 (measured in seated) Left 09/19/23 (measured in seated)  Shoulder flexion WFL 105* deg 105* deg 120* degrees  Shoulder extension      Shoulder abduction WFL* 91* 90* deg 91* deg  Shoulder adduction      Shoulder internal rotation L1 L buttock/65 deg* L buttock/75 deg*  85  Shoulder external rotation T7* C2/35 deg* C4/ 60 deg* 45 deg  *pain at end range   Goal status: ONGOING  4.   Pt will self report the ability to sleep on her left arm positioned to pt comfort, w/o increased pain to demonstrate improvements with ability to improve sleep quality.  Baseline: 07/02/23: Unable to sleep on left arm w/o a pillow underneath. 08/16/23: Pt unable to sleep on the L arm w/o a pillow underneath it; 08/27/23: avoids still due to pain.; 09/19/23: has tried sleeping on her L shoulder but does have pain. 8/10 NPS.  Goal  status: ONGOING  PLAN:  PT FREQUENCY: 2x/week  PT DURATION: 8 weeks  PLANNED INTERVENTIONS: Therapeutic exercises, Therapeutic activity, Neuromuscular re-education, Balance training, Gait training, Patient/Family education, Self Care, Joint mobilization, Joint manipulation, Spinal manipulation, Spinal mobilization, Cryotherapy, Moist heat, and Re-evaluation   PLAN FOR NEXT SESSION: Manual therapy to L infraspinatus, teres minor. Gentle bicep loading (supination), Continue to progress L shoulder AROM and strengthening exercises. (Emphasize ext, abduction and flexion AROM of L shoulder).  Delphia Grates. Fairly IV, PT, DPT Physical Therapist- [facility]  10/29/23, 4:23 PM

## 2023-11-01 ENCOUNTER — Ambulatory Visit: Payer: Medicaid Other

## 2023-11-05 ENCOUNTER — Ambulatory Visit: Payer: Medicaid Other

## 2023-11-08 ENCOUNTER — Ambulatory Visit: Payer: Medicaid Other | Attending: Orthopedic Surgery

## 2023-11-08 DIAGNOSIS — M25612 Stiffness of left shoulder, not elsewhere classified: Secondary | ICD-10-CM | POA: Insufficient documentation

## 2023-11-08 DIAGNOSIS — M25512 Pain in left shoulder: Secondary | ICD-10-CM | POA: Insufficient documentation

## 2023-11-08 DIAGNOSIS — G8929 Other chronic pain: Secondary | ICD-10-CM | POA: Insufficient documentation

## 2023-11-08 DIAGNOSIS — M6281 Muscle weakness (generalized): Secondary | ICD-10-CM | POA: Insufficient documentation

## 2023-11-08 NOTE — Therapy (Signed)
 OUTPATIENT PHYSICAL THERAPY SHOULDER TREATMENT  Patient Name: Carolyn Sampson MRN: [id number] DOB:Jul 28, 1961, 62 y.o., female Today's Date: 11/08/2023  END OF SESSION:  PT End of Session - 11/08/23 1529     Visit Number 21    Number of Visits 32    Date for PT Re-Evaluation 11/14/23    PT Start Time 1431    PT Stop Time 1522    PT Time Calculation (min) 51 min    Activity Tolerance Patient limited by pain;Patient tolerated treatment well    Behavior During Therapy [facility] for tasks assessed/performed              Past Medical History:  Diagnosis Date   Fatty liver    Pre-diabetes    Past Surgical History:  Procedure Laterality Date   ABDOMINAL HYSTERECTOMY     APPENDECTOMY     CESAREAN SECTION     CESAREAN SECTION     COLONOSCOPY WITH PROPOFOL  N/A 06/02/2021   Procedure: COLONOSCOPY WITH PROPOFOL ;  Surgeon: Unk Corinn Skiff, MD;  Location: [facility];  Service: Gastroenterology;  Laterality: N/A;  SPANISH INTERPRETER   LAPAROSCOPIC HYSTERECTOMY     LAPAROSCOPIC OOPHERECTOMY Right    SHOULDER ARTHROSCOPY WITH ROTATOR CUFF REPAIR AND SUBACROMIAL DECOMPRESSION Left 08/22/2021   Procedure: SHOULDER ARTHROSCOPY WITH ROTATOR CUFF REPAIR AND SUBACROMIAL DECOMPRESSION;  Surgeon: Dozier Soulier, MD;  Location: [facility];  Service: Orthopedics;  Laterality: Left;   Patient Active Problem List   Diagnosis Date Noted   Screen for colon cancer    Muscle cramps 05/28/2018   Pre-diabetes 05/28/2018   Fatty liver 05/28/2018   Hair loss 05/28/2018   Sciatica 11/01/2015   S/P right oophorectomy 11/01/2015   S/P hysterectomy 11/01/2015    PCP: N/A  REFERRING PROVIDER: Soulier Dozier, MD  REFERRING DIAG:  M25.519 (ICD-10-CM) - Pain in unspecified shoulder    THERAPY DIAG:  Chronic left shoulder pain  Stiffness of left shoulder, not elsewhere classified  Muscle weakness (generalized)  Rationale for Evaluation and Treatment: Rehabilitation  ONSET  DATE: 07/17/2020  SUBJECTIVE:                                                                                                                                                                                      SUBJECTIVE STATEMENT: Pt reports no pain. Feels like manual therapy helped her elevate her arm more.  Pt is accompanied by contracted translator: [name]  Hand dominance: Right  PERTINENT HISTORY: Pt is a 63 y/o F w/ chronic bilat shoulder pain LUE> RUE. Pt s/p L shoulder arthroscopy w/ RTC repair and subacromial decompression in November, 2022. Pt's has had ongoing pain since  this surgery and has had multiple bouts of physical therapy. Her previous physical therapy being in July, 2024 was d/c to have further orthopedic specialist treatment of the L shoulder. Pt received a cortisone injection in her L shoulder which decreased her pain mildly, and received a new referral to continue physical therapy. Pt reports increased R shoulder pain 2/2 to overuse to compensate from L shoulder pain/ weakness. Pt reports difficulty with carrying objects and lifting with her left arm. Pt's sx are relieved by ceasing movement, icing the L shoulder and mild relief from corticosteroids injections. Pain at best is 4-5/10 and pain at worst is a 7/10. Pt notes mild N/T traveling down the L arm. Along with, difficulty sleeping on her left side, increasing her pain and must have a pillow under her arm.   PAIN:  Are you having pain? Yes: NPRS scale: 1-1.5/10 Pain location: L lateral and anterior shoulder  Pain description: Sharp, stabbing Aggravating factors: Holding something, cooking, lifting arm up  Relieving factors: Steroid injection, ice 4-5/10 NPRS pain at best  PRECAUTIONS: None  RED FLAGS: None   WEIGHT BEARING RESTRICTIONS: No  FALLS:  Has patient fallen in last 6 months? No  OCCUPATION: Unemployed 2/2 L shoulder pain   PLOF: Independent  PATIENT GOALS: To reduce her pain.   NEXT MD VISIT:    OBJECTIVE:  Note: Objective measures were completed at Evaluation unless otherwise noted.  DIAGNOSTIC FINDINGS:  03/18/2022- IMPRESSION:  Status post left shoulder surgery 08/2021. Left shoulder pain and weakness. Compared to 07/12/2021:   1. Interval anterior supraspinatus tendon repair. The prior high-grade partial-thickness tear is no longer seen. There is moderate to high-grade anterior supraspinatus chronic tendinosis. 2. Moderate anterior infraspinatus and mild superior subscapularis chronic tendinosis. 3. Moderate proximal long head of the biceps tendinosis. 4. Likely interval partial distal clavicle excision and mild acromioplasty. 5. Mild-to-moderate subacromial/subdeltoid bursitis, similar to prior.  PATIENT SURVEYS:  FOTO 38/54    COGNITION: Overall cognitive status: Within functional limits for tasks assessed     SENSATION: UQNS: mild decrease in sensation at C2-C3 and C4-C5 dermatomal pattern   POSTURE: Rounded shoulders noted  UPPER EXTREMITY ROM:  Cervical AROM:       Lateral flexion L- 35 deg R-45 deg*     Rotation L 50 deg R 60 deg*    Extension 20 deg*    Flexion 50 deg   Active ROM Right eval Left eval  Shoulder flexion WFL 105*  Shoulder extension    Shoulder abduction WFL* 91*  Shoulder adduction    Shoulder internal rotation L1 L buttock/65 deg*  Shoulder external rotation T7* C2/35 deg*  Elbow flexion    Elbow extension    Wrist flexion    Wrist extension    Wrist ulnar deviation    Wrist radial deviation    Wrist pronation    Wrist supination    (Blank rows = not tested)  PROM: L IR: 75 deg* L ER: 55 deg*  *denotes pain with movement  PROM: R Shoulder- Flexion and Abduction WFL w/ pain at end range PROM: L Shoulder- Flexion and Abduction limited to 90 degrees w/ pain throughout the motion and pain limiting further assessment.   UPPER EXTREMITY MMT:  MMT Right eval Left eval  Shoulder flexion 3+* 2-*  Shoulder extension     Shoulder abduction 3* 2-*  Shoulder adduction    Shoulder internal rotation 4- 2-*  Shoulder external rotation 4- 3+*  Middle trapezius    Lower  trapezius    Elbow flexion    Elbow extension    Wrist flexion    Wrist extension    Wrist ulnar deviation    Wrist radial deviation    Wrist pronation    Wrist supination    Grip strength (lbs)    (Blank rows = not tested)  *denotes pain with movement  SHOULDER SPECIAL TESTS: Not performed 2/2 to increased irritability of pt's sx.   JOINT MOBILITY TESTING:   No significant hypomobility noted between R shoulder and L shoulder joint. L shoulder noting increased pain with muscle guarding. AP/PA and inferior bilaterally  PALPATION:  Anterior and lateral portion of left shoulder TTP    TODAY'S TREATMENT: DATE: 11/08/23  There.ex:  UBE 2 min forward, 2 min backward for shoulder warm up. Not billed.   Seated L shoulder flexion with scap assist from PT for neuro re-ed of scapular upward rotation and protraction. 2x8.  Side lying L shoulder ER with 1# DB: 2x8   Updated HEP to begin AROM in L shoulder flexion/abduction in sitting. Discussion on remaining visits approved of POC for future scheduling. Discussing 1x/week versus 2x/week. Pt and PT agree in 1x/week continuing due to new transportation barriers.    Manual Therapy in sitting: 23 minutes  STM and TPR techniques to L teres major, minor, and infraspinatus for reduced pain, muscular tension, and improved overhead shoulder mechanics.    L shoulder AROM sitting: 123 degrees  L shoulder abduction sitting: 120 degrees  PATIENT EDUCATION: Education details: HEP given to pt Person educated: Patient Education method: Medical Illustrator Education comprehension: verbalized understanding and returned demonstration  HOME EXERCISE PROGRAM:  Access Code: [email or web address] URL: [email or web address] Date: 11/08/2023 Prepared by: Dorina Kingfisher  Exercises -  Seated Upper Trapezius Stretch  - 1 x daily - 5-7 x weekly - 2 sets - 3 reps - 20 hold - Cervical Extension Stretch  - 1 x daily - 5-7 x weekly - 1 sets - 10 reps - 5 hold - Supine Shoulder External Rotation with Dowel  - 1 x daily - 5-7 x weekly - 2 sets - 12 reps - Seated External Rotation in Abduction AAROM with Pulley  - 1 x daily - 7 x weekly - 2 sets - 12 reps - Seated Shoulder Flexion AAROM with Pulley in Front  - 1 x daily - 7 x weekly - 2 sets - 12 reps - Seated Shoulder Flexion with Dowel to 90  - 1 x daily - 7 x weekly - 2 sets - 6 reps - Scapular Retraction with Resistance  - 1 x daily - 3-4 x weekly - 3 sets - 8 reps - Shoulder extension with resistance - Neutral  - 1 x daily - 3-4 x weekly - 3 sets - 8 reps - Shoulder External Rotation Reactive Isometrics  - 1 x daily - 3-4 x weekly - 2 sets - 4 reps - Standing Shoulder Flexion to 90 Degrees  - 1 x daily - 3-4 x weekly - 2 sets - 8 reps - Standing Shoulder Horizontal Abduction with Resistance  - 1 x daily - 3-4 x weekly - 3 sets - 10 reps   Access Code: [email or web address] URL: [email or web address] Date: 07/11/2023 Prepared by: Dorina Kingfisher  Exercises - Seated Upper Trapezius Stretch  - 1 x daily - 5-7 x weekly - 2 sets - 3 reps - 20 hold - Cervical Extension Stretch  - 1 x daily - 5-7 x weekly - 1 sets - 10  reps - 5 hold - Supine Shoulder Flexion Extension AAROM with Dowel  - 1 x daily - 5-7 x weekly - 2 sets - 12 reps - Supine Shoulder External Rotation with Dowel  - 1 x daily - 5-7 x weekly - 2 sets - 12 reps - Seated Shoulder Abduction Towel Slide at Table Top  - 1 x daily - 5-7 x weekly - 3 sets - 6 reps   ASSESSMENT:  CLINICAL IMPRESSION: Utilization of manual intervention to L shoulder to shoulder external and internal rotators to assist in reduced pain with overhead motion and improved scapulohumeral rhythm and GHJ mechanics. Pt able to elevate with similar pain levels but to 123 degrees in flexion and 110  degrees of abduction. Updated HEP to reflect initiating seated L shoulder AROM to improve tolerance in LUE use in more functional positions and utilize gravity for strength training. Pt continues to remain limited in strength due to pain with overhead L shoulder ADL's. Pt will continue to benefit from skilled therapy to address remaining deficits in order to improve overall QoL and return to PLOF.   OBJECTIVE IMPAIRMENTS: decreased mobility, decreased ROM, decreased strength, hypomobility, impaired flexibility, impaired UE functional use, and pain.   ACTIVITY LIMITATIONS: carrying, lifting, sleeping, and bed mobility  PARTICIPATION LIMITATIONS: cleaning, laundry, shopping, and community activity  PERSONAL FACTORS: Age, Past/current experiences, and Time since onset of injury/illness/exacerbation are also affecting patient's functional outcome.   REHAB POTENTIAL: Fair chronicity of L shoulder pain  CLINICAL DECISION MAKING: Evolving/moderate complexity  EVALUATION COMPLEXITY: Moderate   GOALS: Goals reviewed with patient? Yes  SHORT TERM GOALS: Target date: 07/30/2023  Pt will be independent with HEP to improve L shoulder strength and decrease pain with functional activities   Baseline:07/02/23: HEP given at today's session. 08/16/23: HEP compliant.  Goal status: MET  LONG TERM GOALS: Target date: 11/14/2023  Pt will improve FOTO to target score to demonstrate clinically significant improvement in functional mobility.  Baseline: 07/02/23: Deferred to next session 07/09/23: 38/54 08/16/23: 41/54; 08/27/23: 55/45; 41/54; 10/29/23: 44/54 Goal status: ONGOING  2.  Pt will decrease pain to <5/10 to demonstrate clinically significant improvement in pain levels with functional activity and ADL's.  Baseline: 07/02/23: 7/10 NPRS 08/16/23: 7.5-8/10 NPRS; 08/27/23: 7-8/10 NPS.; 09/19/23: 7-8/10 NPS Goal status: ONGOING  3.  Pt will improve all L shoulder AROM to WFL/ to match pt's R shoulder to  improve functional mobility of the LUE.  Baseline: 07/02/23:  Active ROM Right eval Left eval Left 08/16/23 (measured in seated) Left 09/19/23 (measured in seated)  Shoulder flexion WFL 105* deg 105* deg 120* degrees  Shoulder extension      Shoulder abduction WFL* 91* 90* deg 91* deg  Shoulder adduction      Shoulder internal rotation L1 L buttock/65 deg* L buttock/75 deg*  85  Shoulder external rotation T7* C2/35 deg* C4/ 60 deg* 45 deg  *pain at end range   Goal status: ONGOING  4.   Pt will self report the ability to sleep on her left arm positioned to pt comfort, w/o increased pain to demonstrate improvements with ability to improve sleep quality.  Baseline: 07/02/23: Unable to sleep on left arm w/o a pillow underneath. 08/16/23: Pt unable to sleep on the L arm w/o a pillow underneath it; 08/27/23: avoids still due to pain.; 09/19/23: has tried sleeping on her L shoulder but does have pain. 8/10 NPS.  Goal status: ONGOING  PLAN:  PT FREQUENCY: 2x/week  PT DURATION: 8  weeks  PLANNED INTERVENTIONS: Therapeutic exercises, Therapeutic activity, Neuromuscular re-education, Balance training, Gait training, Patient/Family education, Self Care, Joint mobilization, Joint manipulation, Spinal manipulation, Spinal mobilization, Cryotherapy, Moist heat, and Re-evaluation   PLAN FOR NEXT SESSION: Manual therapy to L infraspinatus, teres minor. Gentle bicep loading (supination), Continue to progress L shoulder AROM and strengthening exercises. (Emphasize ext, abduction and flexion AROM of L shoulder).  Dorina HERO. Fairly IV, PT, DPT Physical Therapist- [place]  [facility]  11/08/23, 3:36 PM

## 2023-11-15 ENCOUNTER — Ambulatory Visit: Payer: Medicaid Other

## 2023-11-22 ENCOUNTER — Ambulatory Visit: Payer: Medicaid Other

## 2023-11-22 DIAGNOSIS — M6281 Muscle weakness (generalized): Secondary | ICD-10-CM

## 2023-11-22 DIAGNOSIS — M25612 Stiffness of left shoulder, not elsewhere classified: Secondary | ICD-10-CM

## 2023-11-22 DIAGNOSIS — G8929 Other chronic pain: Secondary | ICD-10-CM

## 2023-11-22 DIAGNOSIS — M25512 Pain in left shoulder: Secondary | ICD-10-CM | POA: Diagnosis not present

## 2023-11-22 NOTE — Therapy (Signed)
OUTPATIENT PHYSICAL THERAPY SHOULDER TREATMENT  Patient Name: Carolyn Sampson MRN: [id number] DOB:[birth year], 63 y.o., female Today's Date: 11/22/2023  END OF SESSION:  PT End of Session - 11/22/23 1324     Visit Number 22    Number of Visits 32    Date for PT Re-Evaluation 12/07/23    PT Start Time 1345    PT Stop Time 1430    PT Time Calculation (min) 45 min    Activity Tolerance Patient limited by pain;Patient tolerated treatment well    Behavior During Therapy [facility] for tasks assessed/performed              Past Medical History:  Diagnosis Date   Fatty liver    Pre-diabetes    Past Surgical History:  Procedure Laterality Date   ABDOMINAL HYSTERECTOMY     APPENDECTOMY     CESAREAN SECTION     CESAREAN SECTION     COLONOSCOPY WITH PROPOFOL N/A 06/02/2021   Procedure: COLONOSCOPY WITH PROPOFOL;  Surgeon: Toney Reil, MD;  Location: [facility];  Service: Gastroenterology;  Laterality: N/A;  SPANISH INTERPRETER   LAPAROSCOPIC HYSTERECTOMY     LAPAROSCOPIC OOPHERECTOMY Right    SHOULDER ARTHROSCOPY WITH ROTATOR CUFF REPAIR AND SUBACROMIAL DECOMPRESSION Left 08/22/2021   Procedure: SHOULDER ARTHROSCOPY WITH ROTATOR CUFF REPAIR AND SUBACROMIAL DECOMPRESSION;  Surgeon: Jones Broom, MD;  Location: [facility];  Service: Orthopedics;  Laterality: Left;   Patient Active Problem List   Diagnosis Date Noted   Screen for colon cancer    Muscle cramps 05/28/2018   Pre-diabetes 05/28/2018   Fatty liver 05/28/2018   Hair loss 05/28/2018   Sciatica 11/01/2015   S/P right oophorectomy 11/01/2015   S/P hysterectomy 11/01/2015    PCP: N/A  REFERRING PROVIDER: Jones Broom, MD  REFERRING DIAG:  M25.519 (ICD-10-CM) - Pain in unspecified shoulder    THERAPY DIAG:  Chronic left shoulder pain  Stiffness of left shoulder, not elsewhere classified  Muscle weakness (generalized)  Rationale for Evaluation and Treatment: Rehabilitation  ONSET  DATE: 07/17/2020  SUBJECTIVE:                                                                                                                                                                                      SUBJECTIVE STATEMENT: Pt reports no pain at rest. Is elevating L shoulder overhead more at home. Pt reports her LUE is quick to fatigue however then becomes painful. Pt is accompanied by contracted translator: Adela Lank  Hand dominance: Right  PERTINENT HISTORY: Pt is a 63 y/o F w/ chronic bilat shoulder pain LUE> RUE. Pt s/p L shoulder arthroscopy w/ RTC repair and  subacromial decompression in November, 2022. Pt's has had ongoing pain since this surgery and has had multiple bouts of physical therapy. Her previous physical therapy being in July, 2024 was d/c to have further orthopedic specialist treatment of the L shoulder. Pt received a cortisone injection in her L shoulder which decreased her pain mildly, and received a new referral to continue physical therapy. Pt reports increased R shoulder pain 2/2 to overuse to compensate from L shoulder pain/ weakness. Pt reports difficulty with carrying objects and lifting with her left arm. Pt's sx are relieved by ceasing movement, icing the L shoulder and mild relief from corticosteroids injections. Pain at best is 4-5/10 and pain at worst is a 7/10. Pt notes mild N/T traveling down the L arm. Along with, difficulty sleeping on her left side, increasing her pain and must have a pillow under her arm.   PAIN:  Are you having pain? Yes: NPRS scale: 0/10 Pain location: L lateral and anterior shoulder  Pain description: Sharp, stabbing Aggravating factors: Holding something, cooking, lifting arm up  Relieving factors: Steroid injection, ice 4-5/10 NPRS pain at best  PRECAUTIONS: None  RED FLAGS: None   WEIGHT BEARING RESTRICTIONS: No  FALLS:  Has patient fallen in last 6 months? No  OCCUPATION: Unemployed 2/2 L shoulder pain   PLOF:  Independent  PATIENT GOALS: To reduce her pain.   NEXT MD VISIT:   OBJECTIVE:  Note: Objective measures were completed at Evaluation unless otherwise noted.  DIAGNOSTIC FINDINGS:  03/18/2022- IMPRESSION:  Status post left shoulder surgery 08/2021. Left shoulder pain and weakness. Compared to 07/12/2021:   1. Interval anterior supraspinatus tendon repair. The prior high-grade partial-thickness tear is no longer seen. There is moderate to high-grade anterior supraspinatus chronic tendinosis. 2. Moderate anterior infraspinatus and mild superior subscapularis chronic tendinosis. 3. Moderate proximal long head of the biceps tendinosis. 4. Likely interval partial distal clavicle excision and mild acromioplasty. 5. Mild-to-moderate subacromial/subdeltoid bursitis, similar to prior.  PATIENT SURVEYS:  FOTO 38/54    COGNITION: Overall cognitive status: Within functional limits for tasks assessed     SENSATION: UQNS: mild decrease in sensation at C2-C3 and C4-C5 dermatomal pattern   POSTURE: Rounded shoulders noted  UPPER EXTREMITY ROM:  Cervical AROM:       Lateral flexion L- 35 deg R-45 deg*     Rotation L 50 deg R 60 deg*    Extension 20 deg*    Flexion 50 deg   Active ROM Right eval Left eval  Shoulder flexion WFL 105*  Shoulder extension    Shoulder abduction WFL* 91*  Shoulder adduction    Shoulder internal rotation L1 L buttock/65 deg*  Shoulder external rotation T7* C2/35 deg*  Elbow flexion    Elbow extension    Wrist flexion    Wrist extension    Wrist ulnar deviation    Wrist radial deviation    Wrist pronation    Wrist supination    (Blank rows = not tested)  PROM: L IR: 75 deg* L ER: 55 deg*  *denotes pain with movement  PROM: R Shoulder- Flexion and Abduction WFL w/ pain at end range PROM: L Shoulder- Flexion and Abduction limited to 90 degrees w/ pain throughout the motion and pain limiting further assessment.   UPPER EXTREMITY MMT:  MMT  Right eval Left eval  Shoulder flexion 3+* 2-*  Shoulder extension    Shoulder abduction 3* 2-*  Shoulder adduction    Shoulder internal rotation 4- 2-*  Shoulder  external rotation 4- 3+*  Middle trapezius    Lower trapezius    Elbow flexion    Elbow extension    Wrist flexion    Wrist extension    Wrist ulnar deviation    Wrist radial deviation    Wrist pronation    Wrist supination    Grip strength (lbs)    (Blank rows = not tested)  *denotes pain with movement  SHOULDER SPECIAL TESTS: Not performed 2/2 to increased irritability of pt's sx.   JOINT MOBILITY TESTING:   No significant hypomobility noted between R shoulder and L shoulder joint. L shoulder noting increased pain with muscle guarding. AP/PA and inferior bilaterally  PALPATION:  Anterior and lateral portion of left shoulder TTP    TODAY'S TREATMENT: DATE: 11/22/23  There.Act:   Seated L shoulder abduction and flexion with scapular assist by PT for upwards rotation and protraction to improve scapulohumeral rhythm. 2x8/plane  Standing LUE with 1# DB:   Alternating flexion/scaption/abduction: 2x6/plane   Manual Therapy in sitting: 25 minutes  STM and TPR techniques to L teres major, minor, upper trapezius, and infraspinatus for reduced pain, muscular tension, and improved overhead shoulder mechanics.    L side lying scapular grade 4 mobilizations. Trialed x3 reps in protraction and retraction. Unfortunately pt's clothing was very slick so unable to adequately grip scapula to complete correctly.   PATIENT EDUCATION: Education details: HEP given to pt Person educated: Patient Education method: Medical illustrator Education comprehension: verbalized understanding and returned demonstration  HOME EXERCISE PROGRAM:  Access Code: [email or web address] URL: [email or web address] Date: 11/08/2023 Prepared by: Ronnie Derby  Exercises - Seated Upper Trapezius Stretch  - 1 x daily - 5-7 x weekly  - 2 sets - 3 reps - 20 hold - Cervical Extension Stretch  - 1 x daily - 5-7 x weekly - 1 sets - 10 reps - 5 hold - Supine Shoulder External Rotation with Dowel  - 1 x daily - 5-7 x weekly - 2 sets - 12 reps - Seated External Rotation in Abduction AAROM with Pulley  - 1 x daily - 7 x weekly - 2 sets - 12 reps - Seated Shoulder Flexion AAROM with Pulley in Front  - 1 x daily - 7 x weekly - 2 sets - 12 reps - Seated Shoulder Flexion with Dowel to 90  - 1 x daily - 7 x weekly - 2 sets - 6 reps - Scapular Retraction with Resistance  - 1 x daily - 3-4 x weekly - 3 sets - 8 reps - Shoulder extension with resistance - Neutral  - 1 x daily - 3-4 x weekly - 3 sets - 8 reps - Shoulder External Rotation Reactive Isometrics  - 1 x daily - 3-4 x weekly - 2 sets - 4 reps - Standing Shoulder Flexion to 90 Degrees  - 1 x daily - 3-4 x weekly - 2 sets - 8 reps - Standing Shoulder Horizontal Abduction with Resistance  - 1 x daily - 3-4 x weekly - 3 sets - 10 reps   Access Code: [email or web address] URL: [email or web address] Date: 07/11/2023 Prepared by: Ronnie Derby  Exercises - Seated Upper Trapezius Stretch  - 1 x daily - 5-7 x weekly - 2 sets - 3 reps - 20 hold - Cervical Extension Stretch  - 1 x daily - 5-7 x weekly - 1 sets - 10 reps - 5 hold - Supine Shoulder Flexion Extension AAROM with Dowel  - 1 x daily - 5-7 x weekly -  2 sets - 12 reps - Supine Shoulder External Rotation with Dowel  - 1 x daily - 5-7 x weekly - 2 sets - 12 reps - Seated Shoulder Abduction Towel Slide at Table Top  - 1 x daily - 5-7 x weekly - 3 sets - 6 reps   ASSESSMENT:  CLINICAL IMPRESSION: Utilization of manual intervention to L shoulder to shoulder external and internal rotators to assist in reduced pain with overhead motion and improved scapulohumeral rhythm and GHJ mechanics. Pt maintaining overhead ranges similar to prior session indicating improved carryover as pt has had ~2 weeks since last session. Also tolerating  resisted L shoulder AROM in various planes but is quick to fatigue with onset of pain. Pt continues to remain limited in strength due to pain with overhead L shoulder ADL's. Pt will continue to benefit from skilled therapy to address remaining deficits in order to improve overall QoL and return to PLOF.   OBJECTIVE IMPAIRMENTS: decreased mobility, decreased ROM, decreased strength, hypomobility, impaired flexibility, impaired UE functional use, and pain.   ACTIVITY LIMITATIONS: carrying, lifting, sleeping, and bed mobility  PARTICIPATION LIMITATIONS: cleaning, laundry, shopping, and community activity  PERSONAL FACTORS: Age, Past/current experiences, and Time since onset of injury/illness/exacerbation are also affecting patient's functional outcome.   REHAB POTENTIAL: Fair chronicity of L shoulder pain  CLINICAL DECISION MAKING: Evolving/moderate complexity  EVALUATION COMPLEXITY: Moderate   GOALS: Goals reviewed with patient? Yes  SHORT TERM GOALS: Target date: 07/30/2023  Pt will be independent with HEP to improve L shoulder strength and decrease pain with functional activities   Baseline:07/02/23: HEP given at today's session. 08/16/23: HEP compliant.  Goal status: MET  LONG TERM GOALS: Target date: 12/07/2023  Pt will improve FOTO to target score to demonstrate clinically significant improvement in functional mobility.  Baseline: 07/02/23: Deferred to next session 07/09/23: 38/54 08/16/23: 41/54; 08/27/23: 40/98; 41/54; 10/29/23: 44/54 Goal status: ONGOING  2.  Pt will decrease pain to <5/10 to demonstrate clinically significant improvement in pain levels with functional activity and ADL's.  Baseline: 07/02/23: 7/10 NPRS 08/16/23: 7.5-8/10 NPRS; 08/27/23: 7-8/10 NPS.; 09/19/23: 7-8/10 NPS Goal status: ONGOING  3.  Pt will improve all L shoulder AROM to WFL/ to match pt's R shoulder to improve functional mobility of the LUE.  Baseline: 07/02/23:  Active ROM Right eval Left eval  Left 08/16/23 (measured in seated) Left 09/19/23 (measured in seated)  Shoulder flexion WFL 105* deg 105* deg 120* degrees  Shoulder extension      Shoulder abduction WFL* 91* 90* deg 91* deg  Shoulder adduction      Shoulder internal rotation L1 L buttock/65 deg* L buttock/75 deg*  85  Shoulder external rotation T7* C2/35 deg* C4/ 60 deg* 45 deg  *pain at end range   Goal status: ONGOING  4.   Pt will self report the ability to sleep on her left arm positioned to pt comfort, w/o increased pain to demonstrate improvements with ability to improve sleep quality.  Baseline: 07/02/23: Unable to sleep on left arm w/o a pillow underneath. 08/16/23: Pt unable to sleep on the L arm w/o a pillow underneath it; 08/27/23: avoids still due to pain.; 09/19/23: has tried sleeping on her L shoulder but does have pain. 8/10 NPS.  Goal status: ONGOING  PLAN:  PT FREQUENCY: 2x/week  PT DURATION: 8 weeks  PLANNED INTERVENTIONS: Therapeutic exercises, Therapeutic activity, Neuromuscular re-education, Balance training, Gait training, Patient/Family education, Self Care, Joint mobilization, Joint manipulation, Spinal manipulation, Spinal mobilization, Cryotherapy, Moist  heat, and Re-evaluation   PLAN FOR NEXT SESSION: Manual therapy to L infraspinatus, teres minor. Gentle bicep loading (supination), Continue to progress L shoulder AROM and strengthening exercises. (Emphasize ext, abduction and flexion AROM of L shoulder).  Delphia Grates. Fairly IV, PT, DPT Physical Therapist- [place]  [facility]  11/22/23, 2:46 PM

## 2023-11-29 ENCOUNTER — Ambulatory Visit: Payer: Medicaid Other

## 2023-11-29 DIAGNOSIS — G8929 Other chronic pain: Secondary | ICD-10-CM

## 2023-11-29 DIAGNOSIS — M25612 Stiffness of left shoulder, not elsewhere classified: Secondary | ICD-10-CM

## 2023-11-29 DIAGNOSIS — M6281 Muscle weakness (generalized): Secondary | ICD-10-CM

## 2023-11-29 DIAGNOSIS — M25512 Pain in left shoulder: Secondary | ICD-10-CM | POA: Diagnosis not present

## 2023-11-29 NOTE — Therapy (Signed)
 OUTPATIENT PHYSICAL THERAPY SHOULDER TREATMENT  Patient Name: Carolyn Sampson MRN: [id number] DOB:25-Dec-1960, 63 y.o., female Today's Date: 11/29/2023  END OF SESSION:  PT End of Session - 11/29/23 1341     Visit Number 23    Number of Visits 32    Date for PT Re-Evaluation 12/07/23    PT Start Time 1345    PT Stop Time 1430    PT Time Calculation (min) 45 min    Activity Tolerance Patient limited by pain;Patient tolerated treatment well    Behavior During Therapy [facility] for tasks assessed/performed              Past Medical History:  Diagnosis Date   Fatty liver    Pre-diabetes    Past Surgical History:  Procedure Laterality Date   ABDOMINAL HYSTERECTOMY     APPENDECTOMY     CESAREAN SECTION     CESAREAN SECTION     COLONOSCOPY WITH PROPOFOL N/A 06/02/2021   Procedure: COLONOSCOPY WITH PROPOFOL;  Surgeon: Toney Reil, MD;  Location: [facility];  Service: Gastroenterology;  Laterality: N/A;  SPANISH INTERPRETER   LAPAROSCOPIC HYSTERECTOMY     LAPAROSCOPIC OOPHERECTOMY Right    SHOULDER ARTHROSCOPY WITH ROTATOR CUFF REPAIR AND SUBACROMIAL DECOMPRESSION Left 08/22/2021   Procedure: SHOULDER ARTHROSCOPY WITH ROTATOR CUFF REPAIR AND SUBACROMIAL DECOMPRESSION;  Surgeon: Jones Broom, MD;  Location: [facility];  Service: Orthopedics;  Laterality: Left;   Patient Active Problem List   Diagnosis Date Noted   Screen for colon cancer    Muscle cramps 05/28/2018   Pre-diabetes 05/28/2018   Fatty liver 05/28/2018   Hair loss 05/28/2018   Sciatica 11/01/2015   S/P right oophorectomy 11/01/2015   S/P hysterectomy 11/01/2015    PCP: N/A  REFERRING PROVIDER: Jones Broom, MD  REFERRING DIAG:  M25.519 (ICD-10-CM) - Pain in unspecified shoulder    THERAPY DIAG:  Chronic left shoulder pain  Stiffness of left shoulder, not elsewhere classified  Muscle weakness (generalized)  Rationale for Evaluation and Treatment: Rehabilitation  ONSET  DATE: 07/17/2020  SUBJECTIVE:                                                                                                                                                                                      SUBJECTIVE STATEMENT: Pt reports mild pain to 1/10 NPS. Reports improved ability to complete ADL's.   Pt is accompanied by contracted translator: Adela Lank  Hand dominance: Right  PERTINENT HISTORY: Pt is a 63 y/o F w/ chronic bilat shoulder pain LUE> RUE. Pt s/p L shoulder arthroscopy w/ RTC repair and subacromial decompression in November, 2022. Pt's has had ongoing pain since  this surgery and has had multiple bouts of physical therapy. Her previous physical therapy being in July, 2024 was d/c to have further orthopedic specialist treatment of the L shoulder. Pt received a cortisone injection in her L shoulder which decreased her pain mildly, and received a new referral to continue physical therapy. Pt reports increased R shoulder pain 2/2 to overuse to compensate from L shoulder pain/ weakness. Pt reports difficulty with carrying objects and lifting with her left arm. Pt's sx are relieved by ceasing movement, icing the L shoulder and mild relief from corticosteroids injections. Pain at best is 4-5/10 and pain at worst is a 7/10. Pt notes mild N/T traveling down the L arm. Along with, difficulty sleeping on her left side, increasing her pain and must have a pillow under her arm.   PAIN:  Are you having pain? Yes: NPRS scale: 1/10 Pain location: L lateral and anterior shoulder  Pain description: Sharp, stabbing Aggravating factors: Holding something, cooking, lifting arm up  Relieving factors: Steroid injection, ice 4-5/10 NPRS pain at best  PRECAUTIONS: None  RED FLAGS: None   WEIGHT BEARING RESTRICTIONS: No  FALLS:  Has patient fallen in last 6 months? No  OCCUPATION: Unemployed 2/2 L shoulder pain   PLOF: Independent  PATIENT GOALS: To reduce her pain.   NEXT MD  VISIT:   OBJECTIVE:  Note: Objective measures were completed at Evaluation unless otherwise noted.  DIAGNOSTIC FINDINGS:  03/18/2022- IMPRESSION:  Status post left shoulder surgery 08/2021. Left shoulder pain and weakness. Compared to 07/12/2021:   1. Interval anterior supraspinatus tendon repair. The prior high-grade partial-thickness tear is no longer seen. There is moderate to high-grade anterior supraspinatus chronic tendinosis. 2. Moderate anterior infraspinatus and mild superior subscapularis chronic tendinosis. 3. Moderate proximal long head of the biceps tendinosis. 4. Likely interval partial distal clavicle excision and mild acromioplasty. 5. Mild-to-moderate subacromial/subdeltoid bursitis, similar to prior.  PATIENT SURVEYS:  FOTO 38/54    COGNITION: Overall cognitive status: Within functional limits for tasks assessed     SENSATION: UQNS: mild decrease in sensation at C2-C3 and C4-C5 dermatomal pattern   POSTURE: Rounded shoulders noted  UPPER EXTREMITY ROM:  Cervical AROM:       Lateral flexion L- 35 deg R-45 deg*     Rotation L 50 deg R 60 deg*    Extension 20 deg*    Flexion 50 deg   Active ROM Right eval Left eval  Shoulder flexion WFL 105*  Shoulder extension    Shoulder abduction WFL* 91*  Shoulder adduction    Shoulder internal rotation L1 L buttock/65 deg*  Shoulder external rotation T7* C2/35 deg*  Elbow flexion    Elbow extension    Wrist flexion    Wrist extension    Wrist ulnar deviation    Wrist radial deviation    Wrist pronation    Wrist supination    (Blank rows = not tested)  PROM: L IR: 75 deg* L ER: 55 deg*  *denotes pain with movement  PROM: R Shoulder- Flexion and Abduction WFL w/ pain at end range PROM: L Shoulder- Flexion and Abduction limited to 90 degrees w/ pain throughout the motion and pain limiting further assessment.   UPPER EXTREMITY MMT:  MMT Right eval Left eval  Shoulder flexion 3+* 2-*  Shoulder  extension    Shoulder abduction 3* 2-*  Shoulder adduction    Shoulder internal rotation 4- 2-*  Shoulder external rotation 4- 3+*  Middle trapezius    Lower  trapezius    Elbow flexion    Elbow extension    Wrist flexion    Wrist extension    Wrist ulnar deviation    Wrist radial deviation    Wrist pronation    Wrist supination    Grip strength (lbs)    (Blank rows = not tested)  *denotes pain with movement  SHOULDER SPECIAL TESTS: Not performed 2/2 to increased irritability of pt's sx.   JOINT MOBILITY TESTING:   No significant hypomobility noted between R shoulder and L shoulder joint. L shoulder noting increased pain with muscle guarding. AP/PA and inferior bilaterally  PALPATION:  Anterior and lateral portion of left shoulder TTP    TODAY'S TREATMENT: DATE: 11/29/23  There.Act:  Standing B shoulder flexion AAROM with red physioball for overhead strengthening, thoracic extension, RTC stability. X12. Trialed again after manual interventions. X12 with significant improvement in range of motion.   Seated L shoulder abduction and flexion with scapular assist by PT for upwards rotation and protraction to improve scapulohumeral rhythm. x8/plane  Standing LUE with 1# DB:   Alternating flexion/scaption/abduction: 2x6/plane  Standing LUE bicep curl neutral grip with concentric portion, supinated for eccentric portion. 2#, 2x6. 3#, 2x5.   R side lying: 2# L shoulder ER, 2x6. 1x6 with 1# DB. Min to mod TC's for LUE positioning. Pt quick to fatigue with increased resistance.  Seated L shoulder AROM post session:  Flexion: 142  Abduction: 120  Manual Therapy in sitting: 15 minutes  STM and TPR techniques to L teres major, minor, upper trapezius, and infraspinatus for reduced pain, muscular tension, and improved overhead shoulder mechanics.   PATIENT EDUCATION: Education details: HEP given to pt Person educated: Patient Education method: Software engineer Education comprehension: verbalized understanding and returned demonstration  HOME EXERCISE PROGRAM:  Access Code: [email or web address] URL: [email or web address] Date: 11/08/2023 Prepared by: Ronnie Derby  Exercises - Seated Upper Trapezius Stretch  - 1 x daily - 5-7 x weekly - 2 sets - 3 reps - 20 hold - Cervical Extension Stretch  - 1 x daily - 5-7 x weekly - 1 sets - 10 reps - 5 hold - Supine Shoulder External Rotation with Dowel  - 1 x daily - 5-7 x weekly - 2 sets - 12 reps - Seated External Rotation in Abduction AAROM with Pulley  - 1 x daily - 7 x weekly - 2 sets - 12 reps - Seated Shoulder Flexion AAROM with Pulley in Front  - 1 x daily - 7 x weekly - 2 sets - 12 reps - Seated Shoulder Flexion with Dowel to 90  - 1 x daily - 7 x weekly - 2 sets - 6 reps - Scapular Retraction with Resistance  - 1 x daily - 3-4 x weekly - 3 sets - 8 reps - Shoulder extension with resistance - Neutral  - 1 x daily - 3-4 x weekly - 3 sets - 8 reps - Shoulder External Rotation Reactive Isometrics  - 1 x daily - 3-4 x weekly - 2 sets - 4 reps - Standing Shoulder Flexion to 90 Degrees  - 1 x daily - 3-4 x weekly - 2 sets - 8 reps - Standing Shoulder Horizontal Abduction with Resistance  - 1 x daily - 3-4 x weekly - 3 sets - 10 reps   Access Code: [email or web address] URL: [email or web address] Date: 07/11/2023 Prepared by: Ronnie Derby  Exercises - Seated Upper Trapezius Stretch  - 1 x daily - 5-7 x weekly - 2 sets -  3 reps - 20 hold - Cervical Extension Stretch  - 1 x daily - 5-7 x weekly - 1 sets - 10 reps - 5 hold - Supine Shoulder Flexion Extension AAROM with Dowel  - 1 x daily - 5-7 x weekly - 2 sets - 12 reps - Supine Shoulder External Rotation with Dowel  - 1 x daily - 5-7 x weekly - 2 sets - 12 reps - Seated Shoulder Abduction Towel Slide at Table Top  - 1 x daily - 5-7 x weekly - 3 sets - 6 reps   ASSESSMENT:  CLINICAL IMPRESSION: Continuing PT POC with manual  intervention and periscapular strengthening. Reducing time spent on manual and increasing therapeutic activity for overhead mobility for ADL completion. Pt with significant improvement in flexion and abduction AROM over these last few visits with changes in POC. Pt tolerating greater levels of resistance but is quick to fatigue and has pain as fatigue worsens. Although ROM is improving pt remains very weak with overhead ranges. Pt will continue to benefit from skilled therapy to address remaining deficits in order to improve overall QoL and return to PLOF.   OBJECTIVE IMPAIRMENTS: decreased mobility, decreased ROM, decreased strength, hypomobility, impaired flexibility, impaired UE functional use, and pain.   ACTIVITY LIMITATIONS: carrying, lifting, sleeping, and bed mobility  PARTICIPATION LIMITATIONS: cleaning, laundry, shopping, and community activity  PERSONAL FACTORS: Age, Past/current experiences, and Time since onset of injury/illness/exacerbation are also affecting patient's functional outcome.   REHAB POTENTIAL: Fair chronicity of L shoulder pain  CLINICAL DECISION MAKING: Evolving/moderate complexity  EVALUATION COMPLEXITY: Moderate   GOALS: Goals reviewed with patient? Yes  SHORT TERM GOALS: Target date: 07/30/2023  Pt will be independent with HEP to improve L shoulder strength and decrease pain with functional activities   Baseline:07/02/23: HEP given at today's session. 08/16/23: HEP compliant.  Goal status: MET  LONG TERM GOALS: Target date: 12/07/2023  Pt will improve FOTO to target score to demonstrate clinically significant improvement in functional mobility.  Baseline: 07/02/23: Deferred to next session 07/09/23: 38/54 08/16/23: 41/54; 08/27/23: 29/56; 41/54; 10/29/23: 44/54 Goal status: ONGOING  2.  Pt will decrease pain to <5/10 to demonstrate clinically significant improvement in pain levels with functional activity and ADL's.  Baseline: 07/02/23: 7/10 NPRS 08/16/23:  7.5-8/10 NPRS; 08/27/23: 7-8/10 NPS.; 09/19/23: 7-8/10 NPS Goal status: ONGOING  3.  Pt will improve all L shoulder AROM to WFL/ to match pt's R shoulder to improve functional mobility of the LUE.  Baseline: 07/02/23:  Active ROM Right eval Left eval Left 08/16/23 (measured in seated) Left 09/19/23 (measured in seated)  Shoulder flexion WFL 105* deg 105* deg 120* degrees  Shoulder extension      Shoulder abduction WFL* 91* 90* deg 91* deg  Shoulder adduction      Shoulder internal rotation L1 L buttock/65 deg* L buttock/75 deg*  85  Shoulder external rotation T7* C2/35 deg* C4/ 60 deg* 45 deg  *pain at end range   Goal status: ONGOING  4.   Pt will self report the ability to sleep on her left arm positioned to pt comfort, w/o increased pain to demonstrate improvements with ability to improve sleep quality.  Baseline: 07/02/23: Unable to sleep on left arm w/o a pillow underneath. 08/16/23: Pt unable to sleep on the L arm w/o a pillow underneath it; 08/27/23: avoids still due to pain.; 09/19/23: has tried sleeping on her L shoulder but does have pain. 8/10 NPS.  Goal status: ONGOING  PLAN:  PT  FREQUENCY: 2x/week  PT DURATION: 8 weeks  PLANNED INTERVENTIONS: Therapeutic exercises, Therapeutic activity, Neuromuscular re-education, Balance training, Gait training, Patient/Family education, Self Care, Joint mobilization, Joint manipulation, Spinal manipulation, Spinal mobilization, Cryotherapy, Moist heat, and Re-evaluation   PLAN FOR NEXT SESSION: Manual therapy to L infraspinatus, teres minor. Gentle bicep loading (supination), Continue to progress L shoulder AROM and strengthening exercises. (Emphasize ext, abduction and flexion AROM of L shoulder).  Delphia Grates. Fairly IV, PT, DPT Physical Therapist- [place]  [facility]  11/29/23, 3:34 PM

## 2023-12-04 ENCOUNTER — Ambulatory Visit: Payer: Medicaid Other | Attending: Orthopedic Surgery

## 2023-12-04 DIAGNOSIS — G8929 Other chronic pain: Secondary | ICD-10-CM | POA: Diagnosis present

## 2023-12-04 DIAGNOSIS — M25512 Pain in left shoulder: Secondary | ICD-10-CM | POA: Diagnosis present

## 2023-12-04 DIAGNOSIS — M25612 Stiffness of left shoulder, not elsewhere classified: Secondary | ICD-10-CM | POA: Insufficient documentation

## 2023-12-04 DIAGNOSIS — M79662 Pain in left lower leg: Secondary | ICD-10-CM | POA: Diagnosis present

## 2023-12-04 DIAGNOSIS — R262 Difficulty in walking, not elsewhere classified: Secondary | ICD-10-CM | POA: Insufficient documentation

## 2023-12-04 DIAGNOSIS — M6281 Muscle weakness (generalized): Secondary | ICD-10-CM | POA: Diagnosis present

## 2023-12-04 NOTE — Therapy (Signed)
 OUTPATIENT PHYSICAL THERAPY SHOULDER TREATMENT  Patient Name: Carolyn Sampson MRN: [id number] DOB:08-15-1961, 63 y.o., female Today's Date: 12/04/2023  END OF SESSION:  PT End of Session - 12/04/23 1038     Visit Number 24    Number of Visits 32    Date for PT Re-Evaluation 12/07/23    PT Start Time 1035    PT Stop Time 1115    PT Time Calculation (min) 40 min    Activity Tolerance Patient limited by pain;Patient tolerated treatment well    Behavior During Therapy [facility] for tasks assessed/performed              Past Medical History:  Diagnosis Date   Fatty liver    Pre-diabetes    Past Surgical History:  Procedure Laterality Date   ABDOMINAL HYSTERECTOMY     APPENDECTOMY     CESAREAN SECTION     CESAREAN SECTION     COLONOSCOPY WITH PROPOFOL N/A 06/02/2021   Procedure: COLONOSCOPY WITH PROPOFOL;  Surgeon: Toney Reil, MD;  Location: [facility];  Service: Gastroenterology;  Laterality: N/A;  SPANISH INTERPRETER   LAPAROSCOPIC HYSTERECTOMY     LAPAROSCOPIC OOPHERECTOMY Right    SHOULDER ARTHROSCOPY WITH ROTATOR CUFF REPAIR AND SUBACROMIAL DECOMPRESSION Left 08/22/2021   Procedure: SHOULDER ARTHROSCOPY WITH ROTATOR CUFF REPAIR AND SUBACROMIAL DECOMPRESSION;  Surgeon: Jones Broom, MD;  Location: [facility];  Service: Orthopedics;  Laterality: Left;   Patient Active Problem List   Diagnosis Date Noted   Screen for colon cancer    Muscle cramps 05/28/2018   Pre-diabetes 05/28/2018   Fatty liver 05/28/2018   Hair loss 05/28/2018   Sciatica 11/01/2015   S/P right oophorectomy 11/01/2015   S/P hysterectomy 11/01/2015    PCP: N/A  REFERRING PROVIDER: Jones Broom, MD  REFERRING DIAG:  M25.519 (ICD-10-CM) - Pain in unspecified shoulder    THERAPY DIAG:  Chronic left shoulder pain  Stiffness of left shoulder, not elsewhere classified  Muscle weakness (generalized)  Rationale for Evaluation and Treatment: Rehabilitation  ONSET  DATE: 07/17/2020  SUBJECTIVE:                                                                                                                                                                                      SUBJECTIVE STATEMENT: Pt reports mild pain to 1/10 NPS. Having more severe R neck pain more than shoulder pain.   Pt is accompanied by contracted translator: Marian Sorrow   Hand dominance: Right  PERTINENT HISTORY: Pt is a 63 y/o F w/ chronic bilat shoulder pain LUE> RUE. Pt s/p L shoulder arthroscopy w/ RTC repair and subacromial decompression in November, 2022. Pt's  has had ongoing pain since this surgery and has had multiple bouts of physical therapy. Her previous physical therapy being in July, 2024 was d/c to have further orthopedic specialist treatment of the L shoulder. Pt received a cortisone injection in her L shoulder which decreased her pain mildly, and received a new referral to continue physical therapy. Pt reports increased R shoulder pain 2/2 to overuse to compensate from L shoulder pain/ weakness. Pt reports difficulty with carrying objects and lifting with her left arm. Pt's sx are relieved by ceasing movement, icing the L shoulder and mild relief from corticosteroids injections. Pain at best is 4-5/10 and pain at worst is a 7/10. Pt notes mild N/T traveling down the L arm. Along with, difficulty sleeping on her left side, increasing her pain and must have a pillow under her arm.   PAIN:  Are you having pain? Yes: NPRS scale: 1/10 Pain location: L lateral and anterior shoulder  Pain description: Sharp, stabbing Aggravating factors: Holding something, cooking, lifting arm up  Relieving factors: Steroid injection, ice 4-5/10 NPRS pain at best  PRECAUTIONS: None  RED FLAGS: None   WEIGHT BEARING RESTRICTIONS: No  FALLS:  Has patient fallen in last 6 months? No  OCCUPATION: Unemployed 2/2 L shoulder pain   PLOF: Independent  PATIENT GOALS: To reduce her pain.   NEXT  MD VISIT:   OBJECTIVE:  Note: Objective measures were completed at Evaluation unless otherwise noted.  DIAGNOSTIC FINDINGS:  03/18/2022- IMPRESSION:  Status post left shoulder surgery 08/2021. Left shoulder pain and weakness. Compared to 07/12/2021:   1. Interval anterior supraspinatus tendon repair. The prior high-grade partial-thickness tear is no longer seen. There is moderate to high-grade anterior supraspinatus chronic tendinosis. 2. Moderate anterior infraspinatus and mild superior subscapularis chronic tendinosis. 3. Moderate proximal long head of the biceps tendinosis. 4. Likely interval partial distal clavicle excision and mild acromioplasty. 5. Mild-to-moderate subacromial/subdeltoid bursitis, similar to prior.  PATIENT SURVEYS:  FOTO 38/54    COGNITION: Overall cognitive status: Within functional limits for tasks assessed     SENSATION: UQNS: mild decrease in sensation at C2-C3 and C4-C5 dermatomal pattern   POSTURE: Rounded shoulders noted  UPPER EXTREMITY ROM:  Cervical AROM:       Lateral flexion L- 35 deg R-45 deg*     Rotation L 50 deg R 60 deg*    Extension 20 deg*    Flexion 50 deg   Active ROM Right eval Left eval  Shoulder flexion WFL 105*  Shoulder extension    Shoulder abduction WFL* 91*  Shoulder adduction    Shoulder internal rotation L1 L buttock/65 deg*  Shoulder external rotation T7* C2/35 deg*  Elbow flexion    Elbow extension    Wrist flexion    Wrist extension    Wrist ulnar deviation    Wrist radial deviation    Wrist pronation    Wrist supination    (Blank rows = not tested)  PROM: L IR: 75 deg* L ER: 55 deg*  *denotes pain with movement  PROM: R Shoulder- Flexion and Abduction WFL w/ pain at end range PROM: L Shoulder- Flexion and Abduction limited to 90 degrees w/ pain throughout the motion and pain limiting further assessment.   UPPER EXTREMITY MMT:  MMT Right eval Left eval  Shoulder flexion 3+* 2-*  Shoulder  extension    Shoulder abduction 3* 2-*  Shoulder adduction    Shoulder internal rotation 4- 2-*  Shoulder external rotation 4- 3+*  Middle  trapezius    Lower trapezius    Elbow flexion    Elbow extension    Wrist flexion    Wrist extension    Wrist ulnar deviation    Wrist radial deviation    Wrist pronation    Wrist supination    Grip strength (lbs)    (Blank rows = not tested)  *denotes pain with movement  SHOULDER SPECIAL TESTS: Not performed 2/2 to increased irritability of pt's sx.   JOINT MOBILITY TESTING:   No significant hypomobility noted between R shoulder and L shoulder joint. L shoulder noting increased pain with muscle guarding. AP/PA and inferior bilaterally  PALPATION:  Anterior and lateral portion of left shoulder TTP    TODAY'S TREATMENT: DATE: 12/04/23  There.Act:  Standing B shoulder flexion AAROM with red physioball for overhead strengthening, thoracic extension, RTC stability. 2x12.   Standing LUE with 2# DB:   Alternating flexion/scaption, 2x6  LUE abduction 1# DB. 2x6  R side lying: 2# L shoulder ER, 3x6. Good form/technique compared to prior sessions. Intermittent Rest breaks during second and third sets to complete the 6 reps.   Standing L shoulder rhythmic stabilizations with 2 KG med ball, 2x10 CW and CCW/direction.    Seated L shoulder AROM post session:  Flexion: 135 Abduction: 118  Manual Therapy in sitting: minutes  STM and TPR techniques to L teres major, minor, upper trapezius, and infraspinatus for reduced pain, muscular tension, and improved overhead shoulder mechanics.   PATIENT EDUCATION: Education details: HEP given to pt Person educated: Patient Education method: Medical illustrator Education comprehension: verbalized understanding and returned demonstration  HOME EXERCISE PROGRAM:  Access Code: [email or web address] URL: [email or web address] Date: 11/08/2023 Prepared by: Ronnie Derby  Exercises -  Seated Upper Trapezius Stretch  - 1 x daily - 5-7 x weekly - 2 sets - 3 reps - 20 hold - Cervical Extension Stretch  - 1 x daily - 5-7 x weekly - 1 sets - 10 reps - 5 hold - Supine Shoulder External Rotation with Dowel  - 1 x daily - 5-7 x weekly - 2 sets - 12 reps - Seated External Rotation in Abduction AAROM with Pulley  - 1 x daily - 7 x weekly - 2 sets - 12 reps - Seated Shoulder Flexion AAROM with Pulley in Front  - 1 x daily - 7 x weekly - 2 sets - 12 reps - Seated Shoulder Flexion with Dowel to 90  - 1 x daily - 7 x weekly - 2 sets - 6 reps - Scapular Retraction with Resistance  - 1 x daily - 3-4 x weekly - 3 sets - 8 reps - Shoulder extension with resistance - Neutral  - 1 x daily - 3-4 x weekly - 3 sets - 8 reps - Shoulder External Rotation Reactive Isometrics  - 1 x daily - 3-4 x weekly - 2 sets - 4 reps - Standing Shoulder Flexion to 90 Degrees  - 1 x daily - 3-4 x weekly - 2 sets - 8 reps - Standing Shoulder Horizontal Abduction with Resistance  - 1 x daily - 3-4 x weekly - 3 sets - 10 reps   Access Code: [email or web address] URL: [email or web address] Date: 07/11/2023 Prepared by: Ronnie Derby  Exercises - Seated Upper Trapezius Stretch  - 1 x daily - 5-7 x weekly - 2 sets - 3 reps - 20 hold - Cervical Extension Stretch  - 1 x daily - 5-7 x weekly - 1 sets - 10 reps -  5 hold - Supine Shoulder Flexion Extension AAROM with Dowel  - 1 x daily - 5-7 x weekly - 2 sets - 12 reps - Supine Shoulder External Rotation with Dowel  - 1 x daily - 5-7 x weekly - 2 sets - 12 reps - Seated Shoulder Abduction Towel Slide at Table Top  - 1 x daily - 5-7 x weekly - 3 sets - 6 reps   ASSESSMENT:  CLINICAL IMPRESSION: Continuing PT POC with manual intervention and periscapular strengthening. Reducing time spent on manual and increasing therapeutic activity for overhead mobility for ADL completion. Pt progressing in resistance training but continues to fatigue quickly with onset of concordant  pain due to weakness. Still reliant on minimal resistance with abduction due to pain. Pt remains with significantly improved overhead AROM but is functionally weak and pain worsens with fatigue. Pt to require recert next visit or d/c depending on progress towards goals.    OBJECTIVE IMPAIRMENTS: decreased mobility, decreased ROM, decreased strength, hypomobility, impaired flexibility, impaired UE functional use, and pain.   ACTIVITY LIMITATIONS: carrying, lifting, sleeping, and bed mobility  PARTICIPATION LIMITATIONS: cleaning, laundry, shopping, and community activity  PERSONAL FACTORS: Age, Past/current experiences, and Time since onset of injury/illness/exacerbation are also affecting patient's functional outcome.   REHAB POTENTIAL: Fair chronicity of L shoulder pain  CLINICAL DECISION MAKING: Evolving/moderate complexity  EVALUATION COMPLEXITY: Moderate   GOALS: Goals reviewed with patient? Yes  SHORT TERM GOALS: Target date: 07/30/2023  Pt will be independent with HEP to improve L shoulder strength and decrease pain with functional activities   Baseline:07/02/23: HEP given at today's session. 08/16/23: HEP compliant.  Goal status: MET  LONG TERM GOALS: Target date: 12/07/2023  Pt will improve FOTO to target score to demonstrate clinically significant improvement in functional mobility.  Baseline: 07/02/23: Deferred to next session 07/09/23: 38/54 08/16/23: 41/54; 08/27/23: 86/57; 41/54; 10/29/23: 44/54 Goal status: ONGOING  2.  Pt will decrease pain to <5/10 to demonstrate clinically significant improvement in pain levels with functional activity and ADL's.  Baseline: 07/02/23: 7/10 NPRS 08/16/23: 7.5-8/10 NPRS; 08/27/23: 7-8/10 NPS.; 09/19/23: 7-8/10 NPS Goal status: ONGOING  3.  Pt will improve all L shoulder AROM to WFL/ to match pt's R shoulder to improve functional mobility of the LUE.  Baseline: 07/02/23:  Active ROM Right eval Left eval Left 08/16/23 (measured in seated)  Left 09/19/23 (measured in seated)  Shoulder flexion WFL 105* deg 105* deg 120* degrees  Shoulder extension      Shoulder abduction WFL* 91* 90* deg 91* deg  Shoulder adduction      Shoulder internal rotation L1 L buttock/65 deg* L buttock/75 deg*  85  Shoulder external rotation T7* C2/35 deg* C4/ 60 deg* 45 deg  *pain at end range   Goal status: ONGOING  4.   Pt will self report the ability to sleep on her left arm positioned to pt comfort, w/o increased pain to demonstrate improvements with ability to improve sleep quality.  Baseline: 07/02/23: Unable to sleep on left arm w/o a pillow underneath. 08/16/23: Pt unable to sleep on the L arm w/o a pillow underneath it; 08/27/23: avoids still due to pain.; 09/19/23: has tried sleeping on her L shoulder but does have pain. 8/10 NPS.  Goal status: ONGOING  PLAN:  PT FREQUENCY: 2x/week  PT DURATION: 8 weeks  PLANNED INTERVENTIONS: Therapeutic exercises, Therapeutic activity, Neuromuscular re-education, Balance training, Gait training, Patient/Family education, Self Care, Joint mobilization, Joint manipulation, Spinal manipulation, Spinal mobilization, Cryotherapy, Moist heat,  and Re-evaluation   PLAN FOR NEXT SESSION: Manual therapy to L infraspinatus, teres minor. Gentle bicep loading (supination), Continue to progress L shoulder AROM and strengthening exercises. (Emphasize ext, abduction and flexion AROM of L shoulder).  Delphia Grates. Fairly IV, PT, DPT Physical Therapist- [place]  [facility]  12/04/23, 12:54 PM

## 2023-12-06 ENCOUNTER — Ambulatory Visit: Payer: Medicaid Other

## 2023-12-06 DIAGNOSIS — G8929 Other chronic pain: Secondary | ICD-10-CM

## 2023-12-06 DIAGNOSIS — M6281 Muscle weakness (generalized): Secondary | ICD-10-CM

## 2023-12-06 DIAGNOSIS — M25612 Stiffness of left shoulder, not elsewhere classified: Secondary | ICD-10-CM

## 2023-12-06 DIAGNOSIS — M25512 Pain in left shoulder: Secondary | ICD-10-CM | POA: Diagnosis not present

## 2023-12-06 NOTE — Therapy (Signed)
 OUTPATIENT PHYSICAL THERAPY SHOULDER TREATMENT/DISCHARGE  Patient Name: Carolyn Sampson MRN: [id number] DOB:July 19, 1961, 63 y.o., female Today's Date: 12/06/2023  END OF SESSION:  PT End of Session - 12/06/23 1346     Visit Number 25    Number of Visits 32    Date for PT Re-Evaluation 12/06/23    PT Start Time 1346    PT Stop Time 1428    PT Time Calculation (min) 42 min    Activity Tolerance Patient limited by pain;Patient tolerated treatment well    Behavior During Therapy [facility] for tasks assessed/performed              Past Medical History:  Diagnosis Date   Fatty liver    Pre-diabetes    Past Surgical History:  Procedure Laterality Date   ABDOMINAL HYSTERECTOMY     APPENDECTOMY     CESAREAN SECTION     CESAREAN SECTION     COLONOSCOPY WITH PROPOFOL N/A 06/02/2021   Procedure: COLONOSCOPY WITH PROPOFOL;  Surgeon: Toney Reil, MD;  Location: [facility];  Service: Gastroenterology;  Laterality: N/A;  SPANISH INTERPRETER   LAPAROSCOPIC HYSTERECTOMY     LAPAROSCOPIC OOPHERECTOMY Right    SHOULDER ARTHROSCOPY WITH ROTATOR CUFF REPAIR AND SUBACROMIAL DECOMPRESSION Left 08/22/2021   Procedure: SHOULDER ARTHROSCOPY WITH ROTATOR CUFF REPAIR AND SUBACROMIAL DECOMPRESSION;  Surgeon: Jones Broom, MD;  Location: [facility];  Service: Orthopedics;  Laterality: Left;   Patient Active Problem List   Diagnosis Date Noted   Screen for colon cancer    Muscle cramps 05/28/2018   Pre-diabetes 05/28/2018   Fatty liver 05/28/2018   Hair loss 05/28/2018   Sciatica 11/01/2015   S/P right oophorectomy 11/01/2015   S/P hysterectomy 11/01/2015    PCP: N/A  REFERRING PROVIDER: Jones Broom, MD  REFERRING DIAG:  M25.519 (ICD-10-CM) - Pain in unspecified shoulder    THERAPY DIAG:  Chronic left shoulder pain  Stiffness of left shoulder, not elsewhere classified  Muscle weakness (generalized)  Rationale for Evaluation and Treatment:  Rehabilitation  ONSET DATE: 07/17/2020  SUBJECTIVE:                                                                                                                                                                                      SUBJECTIVE STATEMENT: Pt reports wanting to transition to her knees and discharge for her shoulder. No pain currently.  Pt is accompanied by contracted translator: Marian Sorrow   Hand dominance: Right  PERTINENT HISTORY: Pt is a 63 y/o F w/ chronic bilat shoulder pain LUE> RUE. Pt s/p L shoulder arthroscopy w/ RTC repair and subacromial decompression in November, 2022. Pt's has had  ongoing pain since this surgery and has had multiple bouts of physical therapy. Her previous physical therapy being in July, 2024 was d/c to have further orthopedic specialist treatment of the L shoulder. Pt received a cortisone injection in her L shoulder which decreased her pain mildly, and received a new referral to continue physical therapy. Pt reports increased R shoulder pain 2/2 to overuse to compensate from L shoulder pain/ weakness. Pt reports difficulty with carrying objects and lifting with her left arm. Pt's sx are relieved by ceasing movement, icing the L shoulder and mild relief from corticosteroids injections. Pain at best is 4-5/10 and pain at worst is a 7/10. Pt notes mild N/T traveling down the L arm. Along with, difficulty sleeping on her left side, increasing her pain and must have a pillow under her arm.   PAIN:  Are you having pain? Yes: NPRS scale: 0/10 Pain location: L lateral and anterior shoulder  Pain description: Sharp, stabbing Aggravating factors: Holding something, cooking, lifting arm up  Relieving factors: Steroid injection, ice 4-5/10 NPRS pain at best  PRECAUTIONS: None  RED FLAGS: None   WEIGHT BEARING RESTRICTIONS: No  FALLS:  Has patient fallen in last 6 months? No  OCCUPATION: Unemployed 2/2 L shoulder pain   PLOF: Independent  PATIENT GOALS:  To reduce her pain.   NEXT MD VISIT:   OBJECTIVE:  Note: Objective measures were completed at Evaluation unless otherwise noted.  DIAGNOSTIC FINDINGS:  03/18/2022- IMPRESSION:  Status post left shoulder surgery 08/2021. Left shoulder pain and weakness. Compared to 07/12/2021:   1. Interval anterior supraspinatus tendon repair. The prior high-grade partial-thickness tear is no longer seen. There is moderate to high-grade anterior supraspinatus chronic tendinosis. 2. Moderate anterior infraspinatus and mild superior subscapularis chronic tendinosis. 3. Moderate proximal long head of the biceps tendinosis. 4. Likely interval partial distal clavicle excision and mild acromioplasty. 5. Mild-to-moderate subacromial/subdeltoid bursitis, similar to prior.  PATIENT SURVEYS:  FOTO 38/54    COGNITION: Overall cognitive status: Within functional limits for tasks assessed     SENSATION: UQNS: mild decrease in sensation at C2-C3 and C4-C5 dermatomal pattern   POSTURE: Rounded shoulders noted  UPPER EXTREMITY ROM:  Cervical AROM:       Lateral flexion L- 35 deg R-45 deg*     Rotation L 50 deg R 60 deg*    Extension 20 deg*    Flexion 50 deg   Active ROM Right eval Left eval  Shoulder flexion WFL 105*  Shoulder extension    Shoulder abduction WFL* 91*  Shoulder adduction    Shoulder internal rotation L1 L buttock/65 deg*  Shoulder external rotation T7* C2/35 deg*  Elbow flexion    Elbow extension    Wrist flexion    Wrist extension    Wrist ulnar deviation    Wrist radial deviation    Wrist pronation    Wrist supination    (Blank rows = not tested)  PROM: L IR: 75 deg* L ER: 55 deg*  *denotes pain with movement  PROM: R Shoulder- Flexion and Abduction WFL w/ pain at end range PROM: L Shoulder- Flexion and Abduction limited to 90 degrees w/ pain throughout the motion and pain limiting further assessment.   UPPER EXTREMITY MMT:  MMT Right eval Left eval   Shoulder flexion 3+* 2-*  Shoulder extension    Shoulder abduction 3* 2-*  Shoulder adduction    Shoulder internal rotation 4- 2-*  Shoulder external rotation 4- 3+*  Middle trapezius  Lower trapezius    Elbow flexion    Elbow extension    Wrist flexion    Wrist extension    Wrist ulnar deviation    Wrist radial deviation    Wrist pronation    Wrist supination    Grip strength (lbs)    (Blank rows = not tested)  *denotes pain with movement  SHOULDER SPECIAL TESTS: Not performed 2/2 to increased irritability of pt's sx.   JOINT MOBILITY TESTING:   No significant hypomobility noted between R shoulder and L shoulder joint. L shoulder noting increased pain with muscle guarding. AP/PA and inferior bilaterally  PALPATION:  Anterior and lateral portion of left shoulder TTP    TODAY'S TREATMENT: DATE: 12/06/23  Physical Measures:   Reviewed goals. See clinical impression and goals section for details.      There.Ex:  Time spent updating pt's HEP. Discussed reps/sets/frequency. Importance of flexibility and strength training to maintain L shoulder AROM. Educated pt on self soft tissue massage using tennis ball as pt has had recent success with STM to rotator cuff and periscapulars.   PATIENT EDUCATION: Education details: HEP given to pt Person educated: Patient Education method: Medical illustrator Education comprehension: verbalized understanding and returned demonstration  HOME EXERCISE PROGRAM:  Access Code: [email or web address] URL: [email or web address] Date: 12/06/2023 Prepared by: Ronnie Derby  Exercises - Seated External Rotation in Abduction AAROM with Pulley  - 1 x daily - 1-2 x weekly - 2 sets - 12 reps - Seated Shoulder Flexion AAROM with Pulley in Front  - 1 x daily - 1-2 x weekly - 2 sets - 12 reps - Scapular Retraction with Resistance  - 1 x daily - 2-3 x weekly - 3 sets - 8 reps - Shoulder extension with resistance - Neutral  - 1 x  daily - 2-3 x weekly - 3 sets - 8 reps - Standing Shoulder Flexion to 90 Degrees  - 1 x daily - 3-4 x weekly - 2 sets - 8 reps - Standing Shoulder Horizontal Abduction with Resistance  - 1 x daily - 3-4 x weekly - 3 sets - 10 reps - Standing Bicep Curls Supinated with Dumbbells  - 1 x daily - 3-4 x weekly - 3 sets - 12 reps - Sidelying Shoulder External Rotation  - 1 x daily - 3-4 x weekly - 3 sets - 12 reps - Standing Thoracic Paraspinal Massage with Foam Roller  - 1 x daily - 7 x weekly - 1 sets - 3 reps - 20 hold  Access Code: [email or web address] URL: [email or web address] Date: 11/08/2023 Prepared by: Ronnie Derby  Exercises - Seated Upper Trapezius Stretch  - 1 x daily - 5-7 x weekly - 2 sets - 3 reps - 20 hold - Cervical Extension Stretch  - 1 x daily - 5-7 x weekly - 1 sets - 10 reps - 5 hold - Supine Shoulder External Rotation with Dowel  - 1 x daily - 5-7 x weekly - 2 sets - 12 reps - Seated External Rotation in Abduction AAROM with Pulley  - 1 x daily - 7 x weekly - 2 sets - 12 reps - Seated Shoulder Flexion AAROM with Pulley in Front  - 1 x daily - 7 x weekly - 2 sets - 12 reps - Seated Shoulder Flexion with Dowel to 90  - 1 x daily - 7 x weekly - 2 sets - 6 reps - Scapular Retraction with Resistance  - 1 x daily - 3-4 x weekly -  3 sets - 8 reps - Shoulder extension with resistance - Neutral  - 1 x daily - 3-4 x weekly - 3 sets - 8 reps - Shoulder External Rotation Reactive Isometrics  - 1 x daily - 3-4 x weekly - 2 sets - 4 reps - Standing Shoulder Flexion to 90 Degrees  - 1 x daily - 3-4 x weekly - 2 sets - 8 reps - Standing Shoulder Horizontal Abduction with Resistance  - 1 x daily - 3-4 x weekly - 3 sets - 10 reps   Access Code: [email or web address] URL: [email or web address] Date: 07/11/2023 Prepared by: Ronnie Derby  Exercises - Seated Upper Trapezius Stretch  - 1 x daily - 5-7 x weekly - 2 sets - 3 reps - 20 hold - Cervical Extension Stretch  - 1 x daily -  5-7 x weekly - 1 sets - 10 reps - 5 hold - Supine Shoulder Flexion Extension AAROM with Dowel  - 1 x daily - 5-7 x weekly - 2 sets - 12 reps - Supine Shoulder External Rotation with Dowel  - 1 x daily - 5-7 x weekly - 2 sets - 12 reps - Seated Shoulder Abduction Towel Slide at Table Top  - 1 x daily - 5-7 x weekly - 3 sets - 6 reps   ASSESSMENT:  CLINICAL IMPRESSION: Pt at end of POC warranting discharge. Pt has made subtle improvements in L shoulder ER but enough to be significant. Pt has vastly improved her shoulder flexion and abduction and surpassing her FOTO scores indicating clinically significant improvement in functional mobility. Pt remains with moderate to severe pain intermittently but frequency is reduced. Pt also remains unable to sleep on her L side because of shoulder pain. Overall pt has made great improvement and likely maximized potential benefits. Pt and PT in agreement to discharge from her shoulder and maintain home based program.   OBJECTIVE IMPAIRMENTS: decreased mobility, decreased ROM, decreased strength, hypomobility, impaired flexibility, impaired UE functional use, and pain.   ACTIVITY LIMITATIONS: carrying, lifting, sleeping, and bed mobility  PARTICIPATION LIMITATIONS: cleaning, laundry, shopping, and community activity  PERSONAL FACTORS: Age, Past/current experiences, and Time since onset of injury/illness/exacerbation are also affecting patient's functional outcome.   REHAB POTENTIAL: Fair chronicity of L shoulder pain  CLINICAL DECISION MAKING: Evolving/moderate complexity  EVALUATION COMPLEXITY: Moderate   GOALS: Goals reviewed with patient? Yes  SHORT TERM GOALS: Target date: 07/30/2023  Pt will be independent with HEP to improve L shoulder strength and decrease pain with functional activities   Baseline:07/02/23: HEP given at today's session. 08/16/23: HEP compliant.  Goal status: MET  LONG TERM GOALS: Target date: 12/07/2023  Pt will improve  FOTO to target score to demonstrate clinically significant improvement in functional mobility.  Baseline: 07/02/23: Deferred to next session 07/09/23: 38/54 08/16/23: 41/54; 08/27/23: 65/78; 41/54; 10/29/23: 44/54; 12/06/23: 58 Goal status: MET  2.  Pt will decrease pain to <5/10 to demonstrate clinically significant improvement in pain levels with functional activity and ADL's.  Baseline: 07/02/23: 7/10 NPRS 08/16/23: 7.5-8/10 NPRS; 08/27/23: 7-8/10 NPS.; 09/19/23: 7-8/10 NPS; 12/06/23: 7-8/10 NPS Goal status: ONGOING  3.  Pt will improve all L shoulder AROM to WFL/ to match pt's R shoulder to improve functional mobility of the LUE.  Baseline: 07/02/23:  Active ROM Right eval Left eval Left 08/16/23 (measured in seated) Left 09/19/23 (measured in seated) Left  12/06/23: Measured in seated  Shoulder flexion WFL 105* deg 105* deg 120* degrees 130 degrees  Shoulder extension  Shoulder abduction WFL* 91* 90* deg 91* deg 118 deg  Shoulder adduction       Shoulder internal rotation L1 L buttock/65 deg* L buttock/75 deg*  85   Shoulder external rotation T7* C2/35 deg* C4/ 60 deg* 45 deg 50 deg   *pain at end range   Goal status: PARTIALLY MET  4.   Pt will self report the ability to sleep on her left arm positioned to pt comfort, w/o increased pain to demonstrate improvements with ability to improve sleep quality.  Baseline: 07/02/23: Unable to sleep on left arm w/o a pillow underneath. 08/16/23: Pt unable to sleep on the L arm w/o a pillow underneath it; 08/27/23: avoids still due to pain.; 09/19/23: has tried sleeping on her L shoulder but does have pain. 8/10 NPS. ; 12/06/23: unable to sleep on her side Goal status: ONGOING  PLAN:  PT FREQUENCY: 2x/week  PT DURATION: 8 weeks  PLANNED INTERVENTIONS: Therapeutic exercises, Therapeutic activity, Neuromuscular re-education, Balance training, Gait training, Patient/Family education, Self Care, Joint mobilization, Joint manipulation, Spinal  manipulation, Spinal mobilization, Cryotherapy, Moist heat, and Re-evaluation   PLAN FOR NEXT SESSION: discharged  Khadeja Abt M. Fairly IV, PT, DPT Physical Therapist-   [facility]  12/06/23, 2:43 PM

## 2023-12-18 ENCOUNTER — Ambulatory Visit

## 2023-12-18 DIAGNOSIS — M79662 Pain in left lower leg: Secondary | ICD-10-CM

## 2023-12-18 DIAGNOSIS — M25512 Pain in left shoulder: Secondary | ICD-10-CM | POA: Diagnosis not present

## 2023-12-18 DIAGNOSIS — M6281 Muscle weakness (generalized): Secondary | ICD-10-CM

## 2023-12-18 DIAGNOSIS — R262 Difficulty in walking, not elsewhere classified: Secondary | ICD-10-CM

## 2023-12-18 NOTE — Therapy (Signed)
 OUTPATIENT PHYSICAL THERAPY LOWER EXTREMITY EVALUATION   Patient Name: Carolyn Sampson MRN: [id number] DOB:1960/12/22, 63 y.o., female Today's Date: 12/18/2023  END OF SESSION:  PT End of Session - 12/18/23 0817     Visit Number 1    Number of Visits 17    Date for PT Re-Evaluation 02/12/24    PT Start Time 0815    PT Stop Time 0900    PT Time Calculation (min) 45 min    Activity Tolerance Patient tolerated treatment well    Behavior During Therapy WFL for tasks assessed/performed             Past Medical History:  Diagnosis Date   Fatty liver    Pre-diabetes    Past Surgical History:  Procedure Laterality Date   ABDOMINAL HYSTERECTOMY     APPENDECTOMY     CESAREAN SECTION     CESAREAN SECTION     COLONOSCOPY WITH PROPOFOL N/A 06/02/2021   Procedure: COLONOSCOPY WITH PROPOFOL;  Surgeon: Toney Reil, MD;  Location: [facility];  Service: Gastroenterology;  Laterality: N/A;  SPANISH INTERPRETER   LAPAROSCOPIC HYSTERECTOMY     LAPAROSCOPIC OOPHERECTOMY Right    SHOULDER ARTHROSCOPY WITH ROTATOR CUFF REPAIR AND SUBACROMIAL DECOMPRESSION Left 08/22/2021   Procedure: SHOULDER ARTHROSCOPY WITH ROTATOR CUFF REPAIR AND SUBACROMIAL DECOMPRESSION;  Surgeon: Jones Broom, MD;  Location: [facility];  Service: Orthopedics;  Laterality: Left;   Patient Active Problem List   Diagnosis Date Noted   Screen for colon cancer    Muscle cramps 05/28/2018   Pre-diabetes 05/28/2018   Fatty liver 05/28/2018   Hair loss 05/28/2018   Sciatica 11/01/2015   S/P right oophorectomy 11/01/2015   S/P hysterectomy 11/01/2015    PCP: N/A  REFERRING PROVIDER: Evaristo Bury, PA-C  REFERRING DIAG: [phone] (ICD-10-CM) - Bilateral knee painM25.561,M25.562 (ICD-10-CM) - Bilateral knee pain  THERAPY DIAG:  Pain in left lower leg  Muscle weakness (generalized)  Difficulty in walking, not elsewhere classified  Rationale for Evaluation and  Treatment: Rehabilitation  ONSET DATE: 09/04/2023  SUBJECTIVE:   SUBJECTIVE STATEMENT: Pt is a 63 y.o. female reporting to Physical Therapy for bilateral knee pain.    PERTINENT HISTORY: Pt well known to PT clinic. Reporting BLE symptoms beginning in hips to her knees. Reports pain as deep and achey pointing to her hamstring region. Worst pain has been a 6-7/10 NPS. Pain is better at rest in sitting. Pain described beginning in her foot, all the way up to her hip in lateral aspect. Also has pain at night with hip positioning which seems to be described as hip ER. Denies N/T in LE's.   PAIN:  Are you having pain? Yes: NPRS scale: 0/10 NPS Pain location: Left Greater Trochanter, left gluteal muscles, posterior left knee  Pain description: Archie Patten  Aggravating factors: Walking, Descending Stairs, Squatting Relieving factors: Sitting, Rest  PRECAUTIONS: None  RED FLAGS: None   WEIGHT BEARING RESTRICTIONS: No  FALLS:  Has patient fallen in last 6 months? Yes. Number of falls 1  LIVING ENVIRONMENT: Lives with: lives alone Stairs: Yes: Internal: 10 steps; bilateral but cannot reach both  OCCUPATION:   PLOF: Independent  PATIENT GOALS: Reduce pain and improve LE strength.   OBJECTIVE:  Note: Objective measures were completed at Evaluation unless otherwise noted.  DIAGNOSTIC FINDINGS: N/A  PATIENT SURVEYS:  LEFS 24/80  COGNITION: Overall cognitive status: Within functional limits for tasks assessed     SENSATION: Light touch: WFL  EDEMA:  Not Assessed  MUSCLE LENGTH: Hamstrings (SLR): Right 175 deg; Left 160 deg  POSTURE: rounded shoulders  PALPATION: TTP along semi tendinosis on R/L. Also TTP along RLE IT band  LUMBAR AROM: Flexion: 100% Extension: 100% Lateral flexion R/L: 100%/50%*   LOWER EXTREMITY ROM:  Active ROM Right eval Left eval  Hip flexion [facility]  [facility]   Hip extension    Hip abduction WFL* WFL  Hip adduction    Hip internal rotation  [facility]  WFL  Hip external rotation [facility]  Knee flexion    Knee extension    Ankle dorsiflexion [facility] WFL  Ankle plantarflexion [facility]  WFL  Ankle inversion    Ankle eversion     (Blank rows = not tested)  LOWER EXTREMITY MMT:  MMT Right eval Left eval  Hip flexion 3+* 4  Hip extension    Hip abduction 3* 3  Hip adduction    Hip internal rotation 5 5  Hip external rotation 5 5  Knee flexion 5* 5  Knee extension 4* 4  Ankle dorsiflexion 5 5  Ankle plantarflexion 4* 5  Ankle inversion    Ankle eversion     (Blank rows = not tested)  LOWER EXTREMITY SPECIAL TESTS:  Hip special tests: Luisa Hart (FABER) test: positive , Trendelenburg test: positive , and Ober's test: negative  FADDIR: Negative bilat  FUNCTIONAL TESTS:  Lateral Step Down: Pain with LLE; Single Leg Squat: Pain with RLE   GAIT: Not assessed                                                                                                                                 TREATMENT DATE:  No treatment provided today. Initial evaluation performed.     PATIENT EDUCATION:  Education details: Initial HEP Person educated: Patient Education method: Explanation, Demonstration, and Handouts Education comprehension: verbalized understanding and returned demonstration  HOME EXERCISE PROGRAM: Access Code: [email or web address] URL: [email or web address] Date: 12/18/2023 Prepared by: Ronnie Derby  Exercises - Supine Hamstring Stretch with Strap  - 1 x daily - 7 x weekly - 1 sets - 3 reps - 20 hold - Supine Single Knee to Chest Stretch  - 1 x daily - 7 x weekly - 1 sets - 3 reps - 20 hold  ASSESSMENT:  CLINICAL IMPRESSION: Patient is a 63 y.o. female who was seen today for physical therapy evaluation and treatment for bilateral knee pain. Objective findings presented with pain throughout left hip but AROM/PROM was not limited. Palpation significant pain along left gluteal muscles, IT band and semitendinosus tendon.  Hamstring length testing WNL and with minor pain in LLE. Presents with reverse trendelenburg on right SLS when performing stair descent and single leg squat due to to pain. Provided initial HEP focused on relieving tension with good return demonstration from patient. Next session to include to initial strengthening program for hip. Currently household and community based activities limited due to pain and strength  deficits. Based on today's performance patient would benefit from OPPT in order to facilitate return to PLOF.   OBJECTIVE IMPAIRMENTS: Abnormal gait, difficulty walking, decreased ROM, decreased strength, and pain.   ACTIVITY LIMITATIONS: carrying, lifting, bending, standing, squatting, and stairs  PARTICIPATION LIMITATIONS: cleaning and community activity  PERSONAL FACTORS: Age, Past/current experiences, and Time since onset of injury/illness/exacerbation are also affecting patient's functional outcome.   REHAB POTENTIAL: Good  CLINICAL DECISION MAKING: Stable/uncomplicated  EVALUATION COMPLEXITY: Low   GOALS: Goals reviewed with patient? No  SHORT TERM GOALS: Target date: 01/15/2024 Patient will report compliance to HEP without verbal cues from PT in order to demonstrate improvements with strength and pain.  Baseline: 12/18/23: Provided initial HEP Goal status: INITIAL  LONG TERM GOALS: Target date: 02/12/2024  Patient will report improvements in MMT by 1 grade in right hip flexion and abduction in order to demonstrate clinically significant improvements with descending stairways and LE strength.  Baseline: 3+  Goal status: INITIAL  2.  Patient will report improvements in LEFS by 9 points in order to demonstrate clinically significant improvements in LE function.  Baseline: 24/80  Goal status: INITIAL  3.  Patient will report reduction in worst pain score by 3 points on NPS in order to demonstrate clinically significant improvements in pain.  Baseline: 7/10  Goal  status: INITIAL  4.  Patient will reduce 5XSTS < 12s in order to demonstrate improvements in LE strength and ability to perform squatting motion.   Baseline:  Deferred to next session.  Goal status: INITIAL  PLAN:  PT FREQUENCY: 1-2x/week  PT DURATION: 8 weeks  PLANNED INTERVENTIONS: 97110-Therapeutic exercises, 97530- Therapeutic activity, [id number]- Neuromuscular re-education, 97535- Self Care, [id number]- Manual therapy, [id number]- Gait training, Stair training, Cryotherapy, and Moist heat  PLAN FOR NEXT SESSION: Initiate LE strengthening program, stair training, update HEP to include strength exercise. 5TSTS   Delphia Grates. Fairly IV, PT, DPT Physical Therapist- [place]  [facility]  12/18/2023, 3:59 PM

## 2023-12-20 ENCOUNTER — Ambulatory Visit

## 2023-12-20 DIAGNOSIS — R262 Difficulty in walking, not elsewhere classified: Secondary | ICD-10-CM

## 2023-12-20 DIAGNOSIS — M6281 Muscle weakness (generalized): Secondary | ICD-10-CM

## 2023-12-20 DIAGNOSIS — M25512 Pain in left shoulder: Secondary | ICD-10-CM | POA: Diagnosis not present

## 2023-12-20 DIAGNOSIS — M79662 Pain in left lower leg: Secondary | ICD-10-CM

## 2023-12-20 NOTE — Therapy (Signed)
 OUTPATIENT PHYSICAL THERAPY LOWER EXTREMITY TREATMENT   Patient Name: Carolyn Sampson MRN: [id number] DOB:[birth year], 63 y.o., female Today's Date: 12/20/2023  END OF SESSION:  PT End of Session - 12/20/23 1433     Visit Number 2    Number of Visits 17    Date for PT Re-Evaluation 02/12/24    PT Start Time 1432    PT Stop Time 1515    PT Time Calculation (min) 43 min    Activity Tolerance Patient tolerated treatment well    Behavior During Therapy WFL for tasks assessed/performed             Past Medical History:  Diagnosis Date   Fatty liver    Pre-diabetes    Past Surgical History:  Procedure Laterality Date   ABDOMINAL HYSTERECTOMY     APPENDECTOMY     CESAREAN SECTION     CESAREAN SECTION     COLONOSCOPY WITH PROPOFOL N/A 06/02/2021   Procedure: COLONOSCOPY WITH PROPOFOL;  Surgeon: Toney Reil, MD;  Location: [facility];  Service: Gastroenterology;  Laterality: N/A;  SPANISH INTERPRETER   LAPAROSCOPIC HYSTERECTOMY     LAPAROSCOPIC OOPHERECTOMY Right    SHOULDER ARTHROSCOPY WITH ROTATOR CUFF REPAIR AND SUBACROMIAL DECOMPRESSION Left 08/22/2021   Procedure: SHOULDER ARTHROSCOPY WITH ROTATOR CUFF REPAIR AND SUBACROMIAL DECOMPRESSION;  Surgeon: Jones Broom, MD;  Location: [facility];  Service: Orthopedics;  Laterality: Left;   Patient Active Problem List   Diagnosis Date Noted   Screen for colon cancer    Muscle cramps 05/28/2018   Pre-diabetes 05/28/2018   Fatty liver 05/28/2018   Hair loss 05/28/2018   Sciatica 11/01/2015   S/P right oophorectomy 11/01/2015   S/P hysterectomy 11/01/2015    PCP: N/A  REFERRING PROVIDER: Evaristo Bury, PA-C  REFERRING DIAG: [id number] (ICD-10-CM) - Bilateral knee painM25.561,M25.562 (ICD-10-CM) - Bilateral knee pain  THERAPY DIAG:  Pain in left lower leg  Muscle weakness (generalized)  Difficulty in walking, not elsewhere classified  Rationale for Evaluation and Treatment:  Rehabilitation  ONSET DATE: 09/04/2023  SUBJECTIVE:   SUBJECTIVE STATEMENT:  Patient reports continued pain in bilateral foot at night, "feels like they are cramping". HEP stretches for the hip were reported to provide minor relief.   PERTINENT HISTORY: Pt well known to PT clinic. Reporting BLE symptoms beginning in hips to her knees. Reports pain as deep and achey pointing to her hamstring region. Worst pain has been a 6-7/10 NPS. Pain is better at rest in sitting. Pain described beginning in her foot, all the way up to her hip in lateral aspect. Also has pain at night with hip positioning which seems to be described as hip ER. Denies N/T in LE's.   PAIN:  Are you having pain? Yes: NPRS scale: 0/10 NPS Pain location: Left Greater Trochanter, left gluteal muscles, posterior left knee  Pain description: Archie Patten  Aggravating factors: Walking, Descending Stairs, Squatting Relieving factors: Sitting, Rest  PRECAUTIONS: None  RED FLAGS: None   WEIGHT BEARING RESTRICTIONS: No  FALLS:  Has patient fallen in last 6 months? Yes. Number of falls 1  LIVING ENVIRONMENT: Lives with: lives alone Stairs: Yes: Internal: 10 steps; bilateral but cannot reach both  OCCUPATION:   PLOF: Independent  PATIENT GOALS: Reduce pain and improve LE strength.   OBJECTIVE:  Note: Objective measures were completed at Evaluation unless otherwise noted.  DIAGNOSTIC FINDINGS: N/A  PATIENT SURVEYS:  LEFS 24/80  COGNITION: Overall cognitive status: Within functional limits for tasks assessed  SENSATION: Light touch: WFL  EDEMA:  Not Assessed  MUSCLE LENGTH: Hamstrings (SLR): Right 175 deg; Left 160 deg  POSTURE: rounded shoulders  PALPATION: TTP along semi tendinosis on R/L. Also TTP along RLE IT band  LUMBAR AROM: Flexion: 100% Extension: 100% Lateral flexion R/L: 100%/50%*   LOWER EXTREMITY ROM:  Active ROM Right eval Left eval  Hip flexion [facility]  [facility]   Hip extension     Hip abduction WFL* WFL  Hip adduction    Hip internal rotation [facility]  WFL  Hip external rotation [facility]  Knee flexion    Knee extension    Ankle dorsiflexion [facility] WFL  Ankle plantarflexion [facility]  WFL  Ankle inversion    Ankle eversion     (Blank rows = not tested)  LOWER EXTREMITY MMT:  MMT Right eval Left eval  Hip flexion 3+* 4  Hip extension    Hip abduction 3* 3  Hip adduction    Hip internal rotation 5 5  Hip external rotation 5 5  Knee flexion 5* 5  Knee extension 4* 4  Ankle dorsiflexion 5 5  Ankle plantarflexion 4* 5  Ankle inversion    Ankle eversion     (Blank rows = not tested)  LOWER EXTREMITY SPECIAL TESTS:  Hip special tests: Luisa Hart (FABER) test: positive , Trendelenburg test: positive , and Ober's test: negative  FADDIR: Negative bilat  FUNCTIONAL TESTS:  Lateral Step Down: Pain with LLE; Single Leg Squat: Pain with RLE   GAIT: Not assessed                                                                                                                                 TREATMENT DATE:  12/20/2023   There.ex:   Review HEP with patient:   Exercises - Supine Hamstring Stretch with Strap  - 1 x daily - 7 x weekly - 1 sets - 3 reps - 20 hold - Supine Single Knee to Chest Stretch  - 1 x daily - 7 x weekly - 1 sets - 3 reps - 20 hold  Hooklying Clamshell against RTB 1x10, patient reported pain in left hip in hooklying position.   Seated Clamshell against RTB 2x10 in order to strengthen gluteal muscles; reduced pain in hip.   Seated Hamstring Stretch 3 x 30s ea leg, multimodal cues provided in order to maintain neutral spine and hip hinge. (Updated to HEP)  Pt education provided on stretching bilateral LE and hydrating prior to sleeping in order to reduce cramping in lower leg.   There.act:  STS from Mat Table with hand held support 1 x 6, pain reported in deep gluteal.   Partial Squats with Hand held support 3 x 5, verbal cues provided for hip  hinge and form.    5XSTS: 16.73s (12.7  1.8 seconds age related norms)   PATIENT EDUCATION:  Education details: HEP  Person educated: Patient Education method: Programmer, multimedia, Demonstration,  and Handouts Education comprehension: verbalized understanding and returned demonstration  HOME EXERCISE PROGRAM:  Access Code: [email or web address] URL: [email or web address] Date: 12/20/2023 Prepared by: Cristal Deer Shamra Bradeen  Exercises - Supine Single Knee to Chest Stretch  - 1 x daily - 7 x weekly - 1 sets - 3 reps - 20 hold - Seated Hip Abduction with Resistance  - 1 x daily - 7 x weekly - 3 sets - 10 reps - Seated Hamstring Stretch  - 1 x daily - 7 x weekly - 3 sets - 10 reps - 30 hold  Access Code: [email or web address] URL: [email or web address] Date: 12/18/2023 Prepared by: Ronnie Derby  Exercises - Supine Hamstring Stretch with Strap  - 1 x daily - 7 x weekly - 1 sets - 3 reps - 20 hold - Supine Single Knee to Chest Stretch  - 1 x daily - 7 x weekly - 1 sets - 3 reps - 20 hold  ASSESSMENT:  CLINICAL IMPRESSION: Patient arrived to physical therapy for first follow up. Pt tolerated initial strengthening exercises for the hip. Patient education provided on hydration and stretch techniques in order to reduce lower leg cramping. Updated HEP to include additional LL stretches and strengthening; good return demonstration from patient. Currently household and community based activities limited due to pain and strength deficits. Based on today's performance patient would benefit from OPPT in order to facilitate return to PLOF.   OBJECTIVE IMPAIRMENTS: Abnormal gait, difficulty walking, decreased ROM, decreased strength, and pain.   ACTIVITY LIMITATIONS: carrying, lifting, bending, standing, squatting, and stairs  PARTICIPATION LIMITATIONS: cleaning and community activity  PERSONAL FACTORS: Age, Past/current experiences, and Time since onset of injury/illness/exacerbation are also affecting  patient's functional outcome.   REHAB POTENTIAL: Good  CLINICAL DECISION MAKING: Stable/uncomplicated  EVALUATION COMPLEXITY: Low   GOALS: Goals reviewed with patient? No  SHORT TERM GOALS: Target date: 01/15/2024 Patient will report compliance to HEP without verbal cues from PT in order to demonstrate improvements with strength and pain.  Baseline: 12/18/23: Provided initial HEP Goal status: INITIAL  LONG TERM GOALS: Target date: 02/12/2024  Patient will report improvements in MMT by 1 grade in right hip flexion and abduction in order to demonstrate clinically significant improvements with descending stairways and LE strength.  Baseline: 3+  Goal status: INITIAL  2.  Patient will report improvements in LEFS by 9 points in order to demonstrate clinically significant improvements in LE function.  Baseline: 24/80  Goal status: INITIAL  3.  Patient will report reduction in worst pain score by 3 points on NPS in order to demonstrate clinically significant improvements in pain.  Baseline: 7/10  Goal status: INITIAL  4.  Patient will reduce 5XSTS < 12s in order to demonstrate improvements in LE strength and ability to perform squatting motion.   Baseline: 12/20/23: 16.73s (12.7  1.8 seconds age related norms) Goal status: INITIAL  PLAN:  PT FREQUENCY: 1-2x/week  PT DURATION: 8 weeks  PLANNED INTERVENTIONS: 97110-Therapeutic exercises, 97530- Therapeutic activity, 97112- Neuromuscular re-education, 97535- Self Care, 78295- Manual therapy, 97116- Gait training, Stair training, Cryotherapy, and Moist heat  PLAN FOR NEXT SESSION: Initiate LE strengthening program, stair training, update HEP to include strength exercise. 5TSTS   Christene Lye PT, DPT Physical Therapist- [facility]  12/20/2023, 4:53 PM

## 2024-01-09 ENCOUNTER — Ambulatory Visit: Attending: Orthopedic Surgery

## 2024-01-09 DIAGNOSIS — M79662 Pain in left lower leg: Secondary | ICD-10-CM | POA: Insufficient documentation

## 2024-01-09 DIAGNOSIS — R262 Difficulty in walking, not elsewhere classified: Secondary | ICD-10-CM | POA: Diagnosis present

## 2024-01-09 DIAGNOSIS — M6281 Muscle weakness (generalized): Secondary | ICD-10-CM | POA: Insufficient documentation

## 2024-01-09 NOTE — Therapy (Signed)
 OUTPATIENT PHYSICAL THERAPY LOWER EXTREMITY TREATMENT   Patient Name: Carolyn Sampson MRN: [id number] DOB:Feb 10, 1961, 63 y.o., female Today's Date: 01/09/2024  END OF SESSION:  PT End of Session - 01/09/24 0856     Visit Number 3    Number of Visits 17    Date for PT Re-Evaluation 02/12/24    PT Start Time 0900    PT Stop Time 0940    PT Time Calculation (min) 40 min    Activity Tolerance Patient tolerated treatment well    Behavior During Therapy WFL for tasks assessed/performed             Past Medical History:  Diagnosis Date   Fatty liver    Pre-diabetes    Past Surgical History:  Procedure Laterality Date   ABDOMINAL HYSTERECTOMY     APPENDECTOMY     CESAREAN SECTION     CESAREAN SECTION     COLONOSCOPY WITH PROPOFOL N/A 06/02/2021   Procedure: COLONOSCOPY WITH PROPOFOL;  Surgeon: Toney Reil, MD;  Location: [facility];  Service: Gastroenterology;  Laterality: N/A;  SPANISH INTERPRETER   LAPAROSCOPIC HYSTERECTOMY     LAPAROSCOPIC OOPHERECTOMY Right    SHOULDER ARTHROSCOPY WITH ROTATOR CUFF REPAIR AND SUBACROMIAL DECOMPRESSION Left 08/22/2021   Procedure: SHOULDER ARTHROSCOPY WITH ROTATOR CUFF REPAIR AND SUBACROMIAL DECOMPRESSION;  Surgeon: Jones Broom, MD;  Location: [facility];  Service: Orthopedics;  Laterality: Left;   Patient Active Problem List   Diagnosis Date Noted   Screen for colon cancer    Muscle cramps 05/28/2018   Pre-diabetes 05/28/2018   Fatty liver 05/28/2018   Hair loss 05/28/2018   Sciatica 11/01/2015   S/P right oophorectomy 11/01/2015   S/P hysterectomy 11/01/2015    PCP: N/A  REFERRING PROVIDER: Evaristo Bury, PA-C  REFERRING DIAG: [phone] (ICD-10-CM) - Bilateral knee painM25.561,M25.562 (ICD-10-CM) - Bilateral knee pain  THERAPY DIAG:  Pain in left lower leg  Muscle weakness (generalized)  Difficulty in walking, not elsewhere classified  Rationale for Evaluation and Treatment:  Rehabilitation  ONSET DATE: 09/04/2023  SUBJECTIVE:   SUBJECTIVE STATEMENT: Patient reports continued pain in her right hip to her foot. Reported 9/10 pain currently. Still has foot pain that wakes her at night.  PERTINENT HISTORY: Pt well known to PT clinic. Reporting BLE symptoms beginning in hips to her knees. Reports pain as deep and achey pointing to her hamstring region. Worst pain has been a 6-7/10 NPS. Pain is better at rest in sitting. Pain described beginning in her foot, all the way up to her hip in lateral aspect. Also has pain at night with hip positioning which seems to be described as hip ER. Denies N/T in LE's.   PAIN:  Are you having pain? Yes: NPRS scale: 0/10 NPS Pain location: Left Greater Trochanter, left gluteal muscles, posterior left knee  Pain description: Archie Patten  Aggravating factors: Walking, Descending Stairs, Squatting Relieving factors: Sitting, Rest  PRECAUTIONS: None  RED FLAGS: None   WEIGHT BEARING RESTRICTIONS: No  FALLS:  Has patient fallen in last 6 months? Yes. Number of falls 1  LIVING ENVIRONMENT: Lives with: lives alone Stairs: Yes: Internal: 10 steps; bilateral but cannot reach both  OCCUPATION:   PLOF: Independent  PATIENT GOALS: Reduce pain and improve LE strength.   OBJECTIVE:  Note: Objective measures were completed at Evaluation unless otherwise noted.  DIAGNOSTIC FINDINGS: N/A  PATIENT SURVEYS:  LEFS 24/80  COGNITION: Overall cognitive status: Within functional limits for tasks assessed  SENSATION: Light touch: WFL  EDEMA:  Not Assessed  MUSCLE LENGTH: Hamstrings (SLR): Right 175 deg; Left 160 deg  POSTURE: rounded shoulders  PALPATION: TTP along semi tendinosis on R/L. Also TTP along RLE IT band  LUMBAR AROM: Flexion: 100% Extension: 100% Lateral flexion R/L: 100%/50%*   LOWER EXTREMITY ROM:  Active ROM Right eval Left eval  Hip flexion [facility]  [facility]   Hip extension    Hip abduction WFL* WFL   Hip adduction    Hip internal rotation [facility]  WFL  Hip external rotation [facility]  Knee flexion    Knee extension    Ankle dorsiflexion [facility] WFL  Ankle plantarflexion [facility]  WFL  Ankle inversion    Ankle eversion     (Blank rows = not tested)  LOWER EXTREMITY MMT:  MMT Right eval Left eval  Hip flexion 3+* 4  Hip extension    Hip abduction 3* 3  Hip adduction    Hip internal rotation 5 5  Hip external rotation 5 5  Knee flexion 5* 5  Knee extension 4* 4  Ankle dorsiflexion 5 5  Ankle plantarflexion 4* 5  Ankle inversion    Ankle eversion     (Blank rows = not tested)  LOWER EXTREMITY SPECIAL TESTS:  Hip special tests: Luisa Hart (FABER) test: positive , Trendelenburg test: positive , and Ober's test: negative  FADDIR: Negative bilat  FUNCTIONAL TESTS:  Lateral Step Down: Pain with LLE; Single Leg Squat: Pain with RLE   GAIT: Not assessed                                                                                                                                 TREATMENT DATE: 01/09/2024  Manual Therapy (10 min):   TTP along R piriformis, glute medius and deep gluteal musculature along sacrum to greater trochanter.   Soft STM to R piriformis and glute med in order to reduce tension and pain modulation; pt endorsed less pain (9 > 6/10) following intervention.   There.ex:   PT demo with pt return demonstration:   Seated Piriformis Stretch: 2 x 30s: Pain reported with OP at R knee.    Supine Piriformis Stretch: 2 x 30s:   Seated Clamshell against Resistance In order to strengthen gluteal muscles; reduced pain in hip:   Red TB 1 x 10  Green TB 2 x 10   Partial Squats with Red TB below knees: 2 x 10 with hand held support.  In order to engage global gluteal strength;   Lateral Stepping near mat table ~ 8 ft x 6 lap; Initial lap with pain reported along R lateral hip, subsided with increased repetition  Standing Hip Abduction at cubicle with BUE support:  1 x  10 with 3# AW with RLE; pain reported along R thigh  2 x 10 without resistance   VC for proper abd and less lateral lumbar flexion.   Time Spent  reviewing new HEP and educating patient on sciatic nerve distribution, deep gluteal muscles, and piriformis muscle affecting sciatic nerve .    PATIENT EDUCATION:  Education details: HEP  Person educated: Patient Education method: Programmer, multimedia, Facilities manager, and Handouts Education comprehension: verbalized understanding and returned demonstration  HOME EXERCISE PROGRAM: Access Code: [email or web address] URL: [email or web address] Date: 01/09/2024 Prepared by: Cristal Deer Len Kluver  Exercises - Supine Single Knee to Chest Stretch  - 1 x daily - 7 x weekly - 1 sets - 3 reps - 20 hold - Seated Hip Abduction with Resistance  - 1 x daily - 3-4 x weekly - 3 sets - 10-12 reps - Seated Hamstring Stretch  - 1 x daily - 7 x weekly - 3 sets - 30 hold - Supine Figure 4 Piriformis Stretch  - 1 x daily - 7 x weekly - 3 sets - 30 hold - Supine Piriformis Stretch with Foot on Ground  - 1 x daily - 7 x weekly - 3 sets - 30 hold - Standing Hip Abduction with Counter Support  - 1 x daily - 3-4 x weekly - 3 sets - 10-12 reps   Access Code: [email or web address] URL: [email or web address] Date: 12/20/2023 Prepared by: Cristal Deer Roxane Puerto  Exercises - Supine Single Knee to Chest Stretch  - 1 x daily - 7 x weekly - 1 sets - 3 reps - 20 hold - Seated Hip Abduction with Resistance  - 1 x daily - 7 x weekly - 3 sets - 10 reps - Seated Hamstring Stretch  - 1 x daily - 7 x weekly - 3 sets - 10 reps - 30 hold  Access Code: [email or web address] URL: [email or web address] Date: 12/18/2023 Prepared by: Ronnie Derby  Exercises - Supine Hamstring Stretch with Strap  - 1 x daily - 7 x weekly - 1 sets - 3 reps - 20 hold - Supine Single Knee to Chest Stretch  - 1 x daily - 7 x weekly - 1 sets - 3 reps - 20 hold  ASSESSMENT:  CLINICAL IMPRESSION: Pt arrived to OPPT  with a main focus on focused improving R hip strengthening and mobility. Extensive education throughout session regarding sciatic nerve distribution and deep gluteal weakness. Time spent utilizing manual techniques to R piriformis and gluteal muscles with endorsement of reduced pain. Pt tolerated exercises without report of additional pain in the R hip. Updated HEP to include additional stretches and hip exercises. Currently household and community based activities limited due to pain and strength deficits. Based on today's performance patient would benefit from OPPT in order to facilitate return to PLOF.   OBJECTIVE IMPAIRMENTS: Abnormal gait, difficulty walking, decreased ROM, decreased strength, and pain.   ACTIVITY LIMITATIONS: carrying, lifting, bending, standing, squatting, and stairs  PARTICIPATION LIMITATIONS: cleaning and community activity  PERSONAL FACTORS: Age, Past/current experiences, and Time since onset of injury/illness/exacerbation are also affecting patient's functional outcome.   REHAB POTENTIAL: Good  CLINICAL DECISION MAKING: Stable/uncomplicated  EVALUATION COMPLEXITY: Low   GOALS: Goals reviewed with patient? No  SHORT TERM GOALS: Target date: 01/15/2024 Patient will report compliance to HEP without verbal cues from PT in order to demonstrate improvements with strength and pain.  Baseline: 12/18/23: Provided initial HEP Goal status: INITIAL  LONG TERM GOALS: Target date: 02/12/2024  Patient will report improvements in MMT by 1 grade in right hip flexion and abduction in order to demonstrate clinically significant improvements with descending stairways and LE strength.  Baseline: 12/18/2023: 3+/5  Goal status: INITIAL  2.  Patient will report improvements in  LEFS by 9 points in order to demonstrate clinically significant improvements in LE function.  Baseline: 12/18/2023: 24/80  Goal status: INITIAL  3.  Patient will report reduction in worst pain score by 3  points on NPS in order to demonstrate clinically significant improvements in pain.  Baseline: 12/18/2023: 7/10  Goal status: INITIAL  4.  Patient will reduce 5XSTS < 12s in order to demonstrate improvements in LE strength and ability to perform squatting motion.   Baseline: 12/20/23: 16.73s (12.7  1.8 seconds age related norms) Goal status: INITIAL  PLAN:  PT FREQUENCY: 1-2x/week  PT DURATION: 8 weeks  PLANNED INTERVENTIONS: 97110-Therapeutic exercises, 97530- Therapeutic activity, 97112- Neuromuscular re-education, 97535- Self Care, [id number]- Manual therapy, [phone]- Gait training, Stair training, Cryotherapy, and Moist heat  PLAN FOR NEXT SESSION: Gluteal Strengthening, SLS activities, Manual Techniques PRN, Stairway navigation   Christene Lye PT, DPT Physical Therapist- [facility]  01/09/2024, 8:57 AM

## 2024-01-15 ENCOUNTER — Ambulatory Visit

## 2024-01-15 DIAGNOSIS — M79662 Pain in left lower leg: Secondary | ICD-10-CM

## 2024-01-15 DIAGNOSIS — R262 Difficulty in walking, not elsewhere classified: Secondary | ICD-10-CM

## 2024-01-15 DIAGNOSIS — M6281 Muscle weakness (generalized): Secondary | ICD-10-CM

## 2024-01-15 NOTE — Therapy (Signed)
 OUTPATIENT PHYSICAL THERAPY LOWER EXTREMITY TREATMENT   Patient Name: Carolyn Sampson MRN: [id number] DOB:1960-11-24, 63 y.o., female Today's Date: 01/15/2024  END OF SESSION:  PT End of Session - 01/15/24 0901     Visit Number 4    Number of Visits 17    Date for PT Re-Evaluation 02/12/24    PT Start Time 0901    PT Stop Time 0945    PT Time Calculation (min) 44 min    Activity Tolerance Patient tolerated treatment well    Behavior During Therapy WFL for tasks assessed/performed             Past Medical History:  Diagnosis Date   Fatty liver    Pre-diabetes    Past Surgical History:  Procedure Laterality Date   ABDOMINAL HYSTERECTOMY     APPENDECTOMY     CESAREAN SECTION     CESAREAN SECTION     COLONOSCOPY WITH PROPOFOL N/A 06/02/2021   Procedure: COLONOSCOPY WITH PROPOFOL;  Surgeon: Selena Daily, MD;  Location: [facility];  Service: Gastroenterology;  Laterality: N/A;  SPANISH INTERPRETER   LAPAROSCOPIC HYSTERECTOMY     LAPAROSCOPIC OOPHERECTOMY Right    SHOULDER ARTHROSCOPY WITH ROTATOR CUFF REPAIR AND SUBACROMIAL DECOMPRESSION Left 08/22/2021   Procedure: SHOULDER ARTHROSCOPY WITH ROTATOR CUFF REPAIR AND SUBACROMIAL DECOMPRESSION;  Surgeon: Sammye Cristal, MD;  Location: [facility];  Service: Orthopedics;  Laterality: Left;   Patient Active Problem List   Diagnosis Date Noted   Screen for colon cancer    Muscle cramps 05/28/2018   Pre-diabetes 05/28/2018   Fatty liver 05/28/2018   Hair loss 05/28/2018   Sciatica 11/01/2015   S/P right oophorectomy 11/01/2015   S/P hysterectomy 11/01/2015    PCP: N/A  REFERRING PROVIDER: Rozann Cornell, PA-C  REFERRING DIAG: [phone] (ICD-10-CM) - Bilateral knee painM25.561,M25.562 (ICD-10-CM) - Bilateral knee pain  THERAPY DIAG:  Pain in left lower leg  Muscle weakness (generalized)  Difficulty in walking, not elsewhere classified  Rationale for Evaluation and Treatment:  Rehabilitation  ONSET DATE: 09/04/2023  SUBJECTIVE:   SUBJECTIVE STATEMENT: Pt reports continued pain in the posterior thigh and the foot. Pt states that she had trouble sleeping last night due to foot pain. 6/10 in RLE.   PERTINENT HISTORY: Pt well known to PT clinic. Reporting BLE symptoms beginning in hips to her knees. Reports pain as deep and achey pointing to her hamstring region. Worst pain has been a 6-7/10 NPS. Pain is better at rest in sitting. Pain described beginning in her foot, all the way up to her hip in lateral aspect. Also has pain at night with hip positioning which seems to be described as hip ER. Denies N/T in LE's.   PAIN:  Are you having pain? Yes: NPRS scale: 0/10 NPS Pain location: Left Greater Trochanter, left gluteal muscles, posterior left knee  Pain description: Jones Ness  Aggravating factors: Walking, Descending Stairs, Squatting Relieving factors: Sitting, Rest  PRECAUTIONS: None  RED FLAGS: None   WEIGHT BEARING RESTRICTIONS: No  FALLS:  Has patient fallen in last 6 months? Yes. Number of falls 1  LIVING ENVIRONMENT: Lives with: lives alone Stairs: Yes: Internal: 10 steps; bilateral but cannot reach both  OCCUPATION:   PLOF: Independent  PATIENT GOALS: Reduce pain and improve LE strength.   OBJECTIVE:  Note: Objective measures were completed at Evaluation unless otherwise noted.  DIAGNOSTIC FINDINGS: N/A  PATIENT SURVEYS:  LEFS 24/80  COGNITION: Overall cognitive status: Within functional limits for tasks  assessed     SENSATION: Light touch: WFL  EDEMA:  Not Assessed  MUSCLE LENGTH: Hamstrings (SLR): Right 175 deg; Left 160 deg  POSTURE: rounded shoulders  PALPATION: TTP along semi tendinosis on R/L. Also TTP along RLE IT band  LUMBAR AROM: Flexion: 100% Extension: 100% Lateral flexion R/L: 100%/50%*   LOWER EXTREMITY ROM:  Active ROM Right eval Left eval  Hip flexion [facility]  [facility]   Hip extension    Hip  abduction WFL* WFL  Hip adduction    Hip internal rotation [facility]  WFL  Hip external rotation [facility]  Knee flexion    Knee extension    Ankle dorsiflexion [facility] WFL  Ankle plantarflexion [facility]  WFL  Ankle inversion    Ankle eversion     (Blank rows = not tested)  LOWER EXTREMITY MMT:  MMT Right eval Left eval  Hip flexion 3+* 4  Hip extension    Hip abduction 3* 3  Hip adduction    Hip internal rotation 5 5  Hip external rotation 5 5  Knee flexion 5* 5  Knee extension 4* 4  Ankle dorsiflexion 5 5  Ankle plantarflexion 4* 5  Ankle inversion    Ankle eversion     (Blank rows = not tested)  LOWER EXTREMITY SPECIAL TESTS:  Hip special tests: Portia Brittle (FABER) test: positive , Trendelenburg test: positive , and Ober's test: negative  FADDIR: Negative bilat  FUNCTIONAL TESTS:  Lateral Step Down: Pain with LLE; Single Leg Squat: Pain with RLE   GAIT: Not assessed                                                                                                                                 TREATMENT DATE: 01/15/2024  Manual Therapy(10 Min):  Light STM provided to R gastrocnemius proximal muscle bellies in order for pain modulation and tissue extensibility. Myofascial Release and contract and relax techniques in order to reduce pain.   There.ex:   PT demo with pt return demonstration:     Seated Calf Stretch with Strap: 4 x 30s   Standing Hip Abduction against resistance:   1 x 10 Green TB   2 x 10 Blue TB   Seated Hip Abduction against resistance:   1 x 10 Red TB around ankles; pain in R hip  1 x 10 Red TB around knees   Standing Hip Flexion marches, donned 3# AW:  2 x 10 ea leg in order to promote SLS balance and LE strength;  Supine Single Knee To Chest (RLE);   2 x 30s   1 x 30s with PT OP   Supine Piriformis Stretch (RLE);   2 x 30s   1 x 30s with PT OP   Extensive education provided to patient regarding ankle cramping relative to R calf tightness. Educated  patient on proper stretching frequency/sets/repetitions.    PATIENT EDUCATION:  Education details: HEP, Careers information officer, Form  and technique Person educated: Patient Education method: Explanation, Demonstration, and Handouts Education comprehension: verbalized understanding and returned demonstration  HOME EXERCISE PROGRAM: Access Code: [email or web address] URL: [email or web address] Date: 01/09/2024 Prepared by: Veryl Gottron Brinley Rosete  Exercises - Supine Single Knee to Chest Stretch  - 1 x daily - 7 x weekly - 1 sets - 3 reps - 20 hold - Seated Hip Abduction with Resistance  - 1 x daily - 3-4 x weekly - 3 sets - 10-12 reps - Seated Hamstring Stretch  - 1 x daily - 7 x weekly - 3 sets - 30 hold - Supine Figure 4 Piriformis Stretch  - 1 x daily - 7 x weekly - 3 sets - 30 hold - Supine Piriformis Stretch with Foot on Ground  - 1 x daily - 7 x weekly - 3 sets - 30 hold - Standing Hip Abduction with Counter Support  - 1 x daily - 3-4 x weekly - 3 sets - 10-12 reps - Long Sitting Calf Stretch with Strap  - 1 x daily - 7 x weekly - 3 sets - 30-60s hold - Long Sitting Ankle Pumps  - 1 x daily - 7 x weekly - 3 sets - 10-20 reps - Seated Heel Raise  - 1 x daily - 7 x weekly - 3 sets - 10-20 reps   Access Code: [email or web address] URL: [email or web address] Date: 12/20/2023 Prepared by: Veryl Gottron Kinslea Frances  Exercises - Supine Single Knee to Chest Stretch  - 1 x daily - 7 x weekly - 1 sets - 3 reps - 20 hold - Seated Hip Abduction with Resistance  - 1 x daily - 7 x weekly - 3 sets - 10 reps - Seated Hamstring Stretch  - 1 x daily - 7 x weekly - 3 sets - 10 reps - 30 hold  Access Code: [email or web address] URL: [email or web address] Date: 12/18/2023 Prepared by: Lyda Samples  Exercises - Supine Hamstring Stretch with Strap  - 1 x daily - 7 x weekly - 1 sets - 3 reps - 20 hold - Supine Single Knee to Chest Stretch  - 1 x daily - 7 x weekly - 1 sets - 3 reps - 20  hold  ASSESSMENT:  CLINICAL IMPRESSION: Pt arrived to OPPT with a main focus on focused increasing R hip strengthening to the gluteals in order to improve pain. Extensive education throughout session regarding stretch protocol for R calve group. Pt reported pain with increased SLS activities in bilateral hips; mitigated to seated rest. Regressed exercises to seated position in order to continue strengthening hip with good response from pt. Updated HEP to include calve stretch. Pt endorsed no pain post session. Currently household and community based activities limited due to pain and strength deficits. Based on today's performance patient would benefit from OPPT in order to facilitate return to PLOF.   OBJECTIVE IMPAIRMENTS: Abnormal gait, difficulty walking, decreased ROM, decreased strength, and pain.   ACTIVITY LIMITATIONS: carrying, lifting, bending, standing, squatting, and stairs  PARTICIPATION LIMITATIONS: cleaning and community activity  PERSONAL FACTORS: Age, Past/current experiences, and Time since onset of injury/illness/exacerbation are also affecting patient's functional outcome.   REHAB POTENTIAL: Good  CLINICAL DECISION MAKING: Stable/uncomplicated  EVALUATION COMPLEXITY: Low   GOALS: Goals reviewed with patient? No  SHORT TERM GOALS: Target date: 01/15/2024 Patient will report compliance to HEP without verbal cues from PT in order to demonstrate improvements with strength and pain.  Baseline: 12/18/23: Provided initial HEP Goal status: INITIAL  LONG TERM GOALS: Target date: 02/12/2024  Patient  will report improvements in MMT by 1 grade in right hip flexion and abduction in order to demonstrate clinically significant improvements with descending stairways and LE strength.  Baseline: 12/18/2023: 3+/5  Goal status: INITIAL  2.  Patient will report improvements in LEFS by 9 points in order to demonstrate clinically significant improvements in LE function.  Baseline:  12/18/2023: 24/80  Goal status: INITIAL  3.  Patient will report reduction in worst pain score by 3 points on NPS in order to demonstrate clinically significant improvements in pain.  Baseline: 12/18/2023: 7/10  Goal status: INITIAL  4.  Patient will reduce 5XSTS < 12s in order to demonstrate improvements in LE strength and ability to perform squatting motion.   Baseline: 12/20/23: 16.73s (12.7  1.8 seconds age related norms) Goal status: INITIAL  PLAN:  PT FREQUENCY: 1-2x/week  PT DURATION: 8 weeks  PLANNED INTERVENTIONS: 97110-Therapeutic exercises, 97530- Therapeutic activity, 97112- Neuromuscular re-education, 97535- Self Care, 65784- Manual therapy, [phone]- Gait training, Stair training, Cryotherapy, and Moist heat  PLAN FOR NEXT SESSION: Gluteal Strengthening, SLS activities, Manual Techniques PRN, Stairway navigation   Satira Curet PT, DPT Physical Therapist- [facility]  [facility]  01/15/2024, 12:00 PM

## 2024-01-21 ENCOUNTER — Encounter

## 2024-01-23 ENCOUNTER — Encounter

## 2024-01-24 ENCOUNTER — Ambulatory Visit

## 2024-01-29 ENCOUNTER — Ambulatory Visit

## 2024-01-31 ENCOUNTER — Telehealth: Payer: Self-pay

## 2024-01-31 ENCOUNTER — Ambulatory Visit: Attending: Orthopedic Surgery

## 2024-01-31 DIAGNOSIS — R262 Difficulty in walking, not elsewhere classified: Secondary | ICD-10-CM | POA: Insufficient documentation

## 2024-01-31 DIAGNOSIS — M79662 Pain in left lower leg: Secondary | ICD-10-CM | POA: Insufficient documentation

## 2024-01-31 DIAGNOSIS — M6281 Muscle weakness (generalized): Secondary | ICD-10-CM | POA: Insufficient documentation

## 2024-01-31 NOTE — Telephone Encounter (Signed)
 Connected with patient's daughter. Patient has recently seen physician and has requested for PT closer to home ([place], [place]). Daughter reports that physician has written an order for an OPPT clinic in [place] but she would like to confirm the order/schedule prior to cancelling future appts with [facility]. No further questions or concerns.

## 2024-01-31 NOTE — Therapy (Deleted)
 OUTPATIENT PHYSICAL THERAPY LOWER EXTREMITY TREATMENT   Patient Name: Carolyn Sampson MRN: [id number] DOB:1960-10-06, 63 y.o., female Today's Date: 01/31/2024  END OF SESSION:    Past Medical History:  Diagnosis Date   Fatty liver    Pre-diabetes    Past Surgical History:  Procedure Laterality Date   ABDOMINAL HYSTERECTOMY     APPENDECTOMY     CESAREAN SECTION     CESAREAN SECTION     COLONOSCOPY WITH PROPOFOL  N/A 06/02/2021   Procedure: COLONOSCOPY WITH PROPOFOL ;  Surgeon: Selena Daily, MD;  Location: [facility];  Service: Gastroenterology;  Laterality: N/A;  SPANISH INTERPRETER   LAPAROSCOPIC HYSTERECTOMY     LAPAROSCOPIC OOPHERECTOMY Right    SHOULDER ARTHROSCOPY WITH ROTATOR CUFF REPAIR AND SUBACROMIAL DECOMPRESSION Left 08/22/2021   Procedure: SHOULDER ARTHROSCOPY WITH ROTATOR CUFF REPAIR AND SUBACROMIAL DECOMPRESSION;  Surgeon: Sammye Cristal, MD;  Location: [facility];  Service: Orthopedics;  Laterality: Left;   Patient Active Problem List   Diagnosis Date Noted   Screen for colon cancer    Muscle cramps 05/28/2018   Pre-diabetes 05/28/2018   Fatty liver 05/28/2018   Hair loss 05/28/2018   Sciatica 11/01/2015   S/P right oophorectomy 11/01/2015   S/P hysterectomy 11/01/2015    PCP: N/A  REFERRING PROVIDER: Rozann Cornell, PA-C  REFERRING DIAG: [phone] (ICD-10-CM) - Bilateral knee painM25.561,M25.562 (ICD-10-CM) - Bilateral knee pain  THERAPY DIAG:  No diagnosis found.  Rationale for Evaluation and Treatment: Rehabilitation  ONSET DATE: 09/04/2023  SUBJECTIVE:   SUBJECTIVE STATEMENT: ***. No further questions or concerns.   PERTINENT HISTORY: Pt well known to PT clinic. Reporting BLE symptoms beginning in hips to her knees. Reports pain as deep and achey pointing to her hamstring region. Worst pain has been a 6-7/10 NPS. Pain is better at rest in sitting. Pain described beginning in her foot, all the way up to  her hip in lateral aspect. Also has pain at night with hip positioning which seems to be described as hip ER. Denies N/T in LE's.   PAIN:  Are you having pain? Yes: NPRS scale: 0/10 NPS Pain location: Left Greater Trochanter, left gluteal muscles, posterior left knee  Pain description: Jones Ness  Aggravating factors: Walking, Descending Stairs, Squatting Relieving factors: Sitting, Rest  PRECAUTIONS: None  RED FLAGS: None   WEIGHT BEARING RESTRICTIONS: No  FALLS:  Has patient fallen in last 6 months? Yes. Number of falls 1  LIVING ENVIRONMENT: Lives with: lives alone Stairs: Yes: Internal: 10 steps; bilateral but cannot reach both  OCCUPATION:   PLOF: Independent  PATIENT GOALS: Reduce pain and improve LE strength.   OBJECTIVE:  Note: Objective measures were completed at Evaluation unless otherwise noted.  DIAGNOSTIC FINDINGS: N/A  PATIENT SURVEYS:  LEFS 24/80  COGNITION: Overall cognitive status: Within functional limits for tasks assessed     SENSATION: Light touch: WFL  EDEMA:  Not Assessed  MUSCLE LENGTH: Hamstrings (SLR): Right 175 deg; Left 160 deg  POSTURE: rounded shoulders  PALPATION: TTP along semi tendinosis on R/L. Also TTP along RLE IT band  LUMBAR AROM: Flexion: 100% Extension: 100% Lateral flexion R/L: 100%/50%*   LOWER EXTREMITY ROM:  Active ROM Right eval Left eval  Hip flexion [facility]  [facility]   Hip extension    Hip abduction WFL* [facility]  Hip adduction    Hip internal rotation [facility]  [facility]  Hip external rotation [facility]  Knee flexion    Knee extension    Ankle dorsiflexion [facility]  WFL  Ankle plantarflexion [facility]  WFL  Ankle inversion    Ankle eversion     (Blank rows = not tested)  LOWER EXTREMITY MMT:  MMT Right eval Left eval  Hip flexion 3+* 4  Hip extension    Hip abduction 3* 3  Hip adduction    Hip internal rotation 5 5  Hip external rotation 5 5  Knee flexion 5* 5  Knee extension 4* 4  Ankle dorsiflexion 5 5  Ankle  plantarflexion 4* 5  Ankle inversion    Ankle eversion     (Blank rows = not tested)  LOWER EXTREMITY SPECIAL TESTS:  Hip special tests: Portia Brittle (FABER) test: positive , Trendelenburg test: positive , and Ober's test: negative  FADDIR: Negative bilat  FUNCTIONAL TESTS:  Lateral Step Down: Pain with LLE; Single Leg Squat: Pain with RLE   GAIT: Not assessed                                                                                                                                 TREATMENT DATE: 01/31/2024  There.ex:   PT demo with return demonstration of Seated Sciatic Nerve Glides    1 x 10   Clamshell (Sidelying R)   Extensive education provided to patient regarding ankle cramping relative to R calf tightness. Educated patient on proper stretching frequency/sets/repetitions.    PATIENT EDUCATION:  Education details: HEP, Careers information officer, Form and technique Person educated: Patient Education method: Explanation, Demonstration, and Handouts Education comprehension: verbalized understanding and returned demonstration  HOME EXERCISE PROGRAM: Access Code: [email or web address] URL: [email or web address] Date: 01/09/2024 Prepared by: Veryl Gottron Lovey Crupi  Exercises - Supine Single Knee to Chest Stretch  - 1 x daily - 7 x weekly - 1 sets - 3 reps - 20 hold - Seated Hip Abduction with Resistance  - 1 x daily - 3-4 x weekly - 3 sets - 10-12 reps - Seated Hamstring Stretch  - 1 x daily - 7 x weekly - 3 sets - 30 hold - Supine Figure 4 Piriformis Stretch  - 1 x daily - 7 x weekly - 3 sets - 30 hold - Supine Piriformis Stretch with Foot on Ground  - 1 x daily - 7 x weekly - 3 sets - 30 hold - Standing Hip Abduction with Counter Support  - 1 x daily - 3-4 x weekly - 3 sets - 10-12 reps - Long Sitting Calf Stretch with Strap  - 1 x daily - 7 x weekly - 3 sets - 30-60s hold - Long Sitting Ankle Pumps  - 1 x daily - 7 x weekly - 3 sets - 10-20 reps - Seated Heel Raise  - 1 x daily -  7 x weekly - 3 sets - 10-20 reps   Access Code: [email or web address] URL: [email or web address] Date: 12/20/2023 Prepared by: Satira Curet  Exercises - Supine Single Knee to Chest Stretch  - 1 x daily - 7 x weekly -  1 sets - 3 reps - 20 hold - Seated Hip Abduction with Resistance  - 1 x daily - 7 x weekly - 3 sets - 10 reps - Seated Hamstring Stretch  - 1 x daily - 7 x weekly - 3 sets - 10 reps - 30 hold  Access Code: [email or web address] URL: [email or web address] Date: 12/18/2023 Prepared by: Lyda Samples  Exercises - Supine Hamstring Stretch with Strap  - 1 x daily - 7 x weekly - 1 sets - 3 reps - 20 hold - Supine Single Knee to Chest Stretch  - 1 x daily - 7 x weekly - 1 sets - 3 reps - 20 hold  ASSESSMENT:  CLINICAL IMPRESSION: Pt arrived to OPPT with a main focus on focused increasing R hip strengthening to the gluteals in order to improve pain. Extensive education throughout session regarding stretch protocol for R calve group. Pt reported pain with increased SLS activities in bilateral hips; mitigated to seated rest. Regressed exercises to seated position in order to continue strengthening hip with good response from pt. Updated HEP to include calve stretch. Pt endorsed no pain post session. Currently household and community based activities limited due to pain and strength deficits. Based on today's performance patient would benefit from OPPT in order to facilitate return to PLOF.   OBJECTIVE IMPAIRMENTS: Abnormal gait, difficulty walking, decreased ROM, decreased strength, and pain.   ACTIVITY LIMITATIONS: carrying, lifting, bending, standing, squatting, and stairs  PARTICIPATION LIMITATIONS: cleaning and community activity  PERSONAL FACTORS: Age, Past/current experiences, and Time since onset of injury/illness/exacerbation are also affecting patient's functional outcome.   REHAB POTENTIAL: Good  CLINICAL DECISION MAKING: Stable/uncomplicated  EVALUATION  COMPLEXITY: Low   GOALS: Goals reviewed with patient? No  SHORT TERM GOALS: Target date: 01/15/2024 Patient will report compliance to HEP without verbal cues from PT in order to demonstrate improvements with strength and pain.  Baseline: 12/18/23: Provided initial HEP Goal status: INITIAL  LONG TERM GOALS: Target date: 02/12/2024  Patient will report improvements in MMT by 1 grade in right hip flexion and abduction in order to demonstrate clinically significant improvements with descending stairways and LE strength.  Baseline: 12/18/2023: 3+/5  Goal status: INITIAL  2.  Patient will report improvements in LEFS by 9 points in order to demonstrate clinically significant improvements in LE function.  Baseline: 12/18/2023: 24/80  Goal status: INITIAL  3.  Patient will report reduction in worst pain score by 3 points on NPS in order to demonstrate clinically significant improvements in pain.  Baseline: 12/18/2023: 7/10  Goal status: INITIAL  4.  Patient will reduce 5XSTS < 12s in order to demonstrate improvements in LE strength and ability to perform squatting motion.   Baseline: 12/20/23: 16.73s (12.7  1.8 seconds age related norms) Goal status: INITIAL  PLAN:  PT FREQUENCY: 1-2x/week  PT DURATION: 8 weeks  PLANNED INTERVENTIONS: 97110-Therapeutic exercises, 97530- Therapeutic activity, 97112- Neuromuscular re-education, 97535- Self Care, 24401- Manual therapy, [phone]- Gait training, Stair training, Cryotherapy, and Moist heat  PLAN FOR NEXT SESSION: Gluteal Strengthening, SLS activities, Manual Techniques PRN, Stairway navigation   Satira Curet PT, DPT Physical Therapist-   [facility]  01/31/2024, 9:48 AM

## 2024-02-05 ENCOUNTER — Ambulatory Visit

## 2024-02-05 DIAGNOSIS — M6281 Muscle weakness (generalized): Secondary | ICD-10-CM

## 2024-02-05 DIAGNOSIS — R262 Difficulty in walking, not elsewhere classified: Secondary | ICD-10-CM

## 2024-02-05 DIAGNOSIS — M79662 Pain in left lower leg: Secondary | ICD-10-CM

## 2024-02-05 NOTE — Therapy (Signed)
 OUTPATIENT PHYSICAL THERAPY LOWER EXTREMITY TREATMENT   Patient Name: Carolyn Sampson MRN: [id number] DOB:Jan 10, 1961, 63 y.o., female Today's Date: 02/05/2024  END OF SESSION:  PT End of Session - 02/05/24 1258     Visit Number 5    Number of Visits 17    Date for PT Re-Evaluation 02/12/24    PT Start Time 1258    PT Stop Time 1340    PT Time Calculation (min) 42 min    Activity Tolerance Patient tolerated treatment well    Behavior During Therapy WFL for tasks assessed/performed              Past Medical History:  Diagnosis Date   Fatty liver    Pre-diabetes    Past Surgical History:  Procedure Laterality Date   ABDOMINAL HYSTERECTOMY     APPENDECTOMY     CESAREAN SECTION     CESAREAN SECTION     COLONOSCOPY WITH PROPOFOL  N/A 06/02/2021   Procedure: COLONOSCOPY WITH PROPOFOL ;  Surgeon: Selena Daily, MD;  Location: [facility];  Service: Gastroenterology;  Laterality: N/A;  SPANISH INTERPRETER   LAPAROSCOPIC HYSTERECTOMY     LAPAROSCOPIC OOPHERECTOMY Right    SHOULDER ARTHROSCOPY WITH ROTATOR CUFF REPAIR AND SUBACROMIAL DECOMPRESSION Left 08/22/2021   Procedure: SHOULDER ARTHROSCOPY WITH ROTATOR CUFF REPAIR AND SUBACROMIAL DECOMPRESSION;  Surgeon: Sammye Cristal, MD;  Location: [facility];  Service: Orthopedics;  Laterality: Left;   Patient Active Problem List   Diagnosis Date Noted   Screen for colon cancer    Muscle cramps 05/28/2018   Pre-diabetes 05/28/2018   Fatty liver 05/28/2018   Hair loss 05/28/2018   Sciatica 11/01/2015   S/P right oophorectomy 11/01/2015   S/P hysterectomy 11/01/2015    PCP: N/A  REFERRING PROVIDER: Rozann Cornell, PA-C  REFERRING DIAG: [id number] (ICD-10-CM) - Bilateral knee painM25.561,M25.562 (ICD-10-CM) - Bilateral knee pain  THERAPY DIAG:  Pain in left lower leg  Muscle weakness (generalized)  Difficulty in walking, not elsewhere classified  Rationale for Evaluation and  Treatment: Rehabilitation  ONSET DATE: 09/04/2023  SUBJECTIVE:   SUBJECTIVE STATEMENT: Patient reports continued pain in the R thigh into the foot. 6-7/10 in RLE. No additional questions or concerns.  PERTINENT HISTORY: Pt well known to PT clinic. Reporting BLE symptoms beginning in hips to her knees. Reports pain as deep and achey pointing to her hamstring region. Worst pain has been a 6-7/10 NPS. Pain is better at rest in sitting. Pain described beginning in her foot, all the way up to her hip in lateral aspect. Also has pain at night with hip positioning which seems to be described as hip ER. Denies N/T in LE's.   PAIN:  Are you having pain? Yes: NPRS scale: 0/10 NPS Pain location: Left Greater Trochanter, left gluteal muscles, posterior left knee  Pain description: Jones Ness  Aggravating factors: Walking, Descending Stairs, Squatting Relieving factors: Sitting, Rest  PRECAUTIONS: None  RED FLAGS: None   WEIGHT BEARING RESTRICTIONS: No  FALLS:  Has patient fallen in last 6 months? Yes. Number of falls 1  LIVING ENVIRONMENT: Lives with: lives alone Stairs: Yes: Internal: 10 steps; bilateral but cannot reach both  OCCUPATION:   PLOF: Independent  PATIENT GOALS: Reduce pain and improve LE strength.   OBJECTIVE:  Note: Objective measures were completed at Evaluation unless otherwise noted.  DIAGNOSTIC FINDINGS: N/A  PATIENT SURVEYS:  LEFS 24/80  COGNITION: Overall cognitive status: Within functional limits for tasks assessed     SENSATION: Light touch:  WFL  EDEMA:  Not Assessed  MUSCLE LENGTH: Hamstrings (SLR): Right 175 deg; Left 160 deg  POSTURE: rounded shoulders  PALPATION: TTP along semi tendinosis on R/L. Also TTP along RLE IT band  LUMBAR AROM: Flexion: 100% Extension: 100% Lateral flexion R/L: 100%/50%*   LOWER EXTREMITY ROM:  Active ROM Right eval Left eval  Hip flexion [facility]   Hip extension    Hip abduction WFL* WFL  Hip  adduction    Hip internal rotation [facility]  WFL  Hip external rotation [facility]  Knee flexion    Knee extension    Ankle dorsiflexion [facility] WFL  Ankle plantarflexion [facility]  WFL  Ankle inversion    Ankle eversion     (Blank rows = not tested)  LOWER EXTREMITY MMT:  MMT Right eval Left eval  Hip flexion 3+* 4  Hip extension    Hip abduction 3* 3  Hip adduction    Hip internal rotation 5 5  Hip external rotation 5 5  Knee flexion 5* 5  Knee extension 4* 4  Ankle dorsiflexion 5 5  Ankle plantarflexion 4* 5  Ankle inversion    Ankle eversion     (Blank rows = not tested)  LOWER EXTREMITY SPECIAL TESTS:  Hip special tests: Portia Brittle (FABER) test: positive , Trendelenburg test: positive , and Ober's test: negative  FADDIR: Negative bilat  FUNCTIONAL TESTS:  Lateral Step Down: Pain with LLE; Single Leg Squat: Pain with RLE   GAIT: Not assessed                                                                                                                                 TREATMENT DATE: 02/05/2024  There.ex:   Supine Glute Bridges  1 x 10   2 x 10 Red TB around knees, minor pain reported along R glute  L Sidelying R Hip Abduction, Straight Leg (Pain reported in L shoulder from prev. Injury)   1 x 10 AROM only   Hip Matrix:   Abduction R/L: 1 x 10 x 10#; 1 x 8 x 10#   Extension R/L: 1 x 10 x 10#  VC for less compensation with forward trunk lean   Seated Lumbar Extension  1 x 15, pt endorsed reduced pain along lower back and hip  Passive Supine Piriformis Stretch (RLE);   30s/bout x 3 in order to improve pain and tissue extensibility  - Education on proper technique for piriformis stretch (hand placement,over pressure force)  Supine Nerve Glide (Hip Flexed to 90 Flexion)    2 x 10, VC for Dorsiflexion   - Education on proper technique for nerve glide with use of pillow sheet across thigh for support  Therapeutic Activities:  Partial Squats with Resistance    3 x 8,  Green TB below knee      Lateral Stepping with Resistance   2 x 10' Red TB around ankles  2 x 10' Red TB    Standing Hip Abduction against resistance:    R/L: 3 x 10 Red TB around ankles   Stairway Navigation x 4 Trials    Pt preferred step-to pattern with concordant pain during R SLS phases (ascending/descending)   PATIENT EDUCATION:  Education details: HEP, Careers information officer, Form and technique Person educated: Patient Education method: Explanation, Demonstration, and Handouts Education comprehension: verbalized understanding and returned demonstration  HOME EXERCISE PROGRAM: Access Code: [email or web address] URL: [email or web address] Date: 02/05/2024 Prepared by: Veryl Gottron Anora Schwenke  Exercises - Supine Single Knee to Chest Stretch  - 1 x daily - 7 x weekly - 1 sets - 3 reps - 20 hold - Seated Hip Abduction with Resistance  - 1 x daily - 3-4 x weekly - 3 sets - 10-12 reps - Seated Hamstring Stretch  - 1 x daily - 7 x weekly - 3 sets - 30 hold - Supine Piriformis Stretch with Foot on Ground  - 1 x daily - 7 x weekly - 3 sets - 30 hold - Seated Lumbar Extension  - 1 x daily - 7 x weekly - 3 sets - 8-10 reps - Standing Hip Abduction Kicks  - 1 x daily - 7 x weekly - 3 sets - 8-10 reps - Supine Piriformis Stretch  - 1-2 x daily - 7 x weekly - 3 sets - 30-60s hold - Mini Squat with Counter Support  - 1 x daily - 7 x weekly - 2-3 sets - 8-10 reps - Supine Sciatic Nerve Glide  - 1 x daily - 7 x weekly - 2-3 sets - 10 reps  Access Code: [email or web address] URL: [email or web address] Date: 01/09/2024 Prepared by: Veryl Gottron Ladon Heney  Exercises - Supine Single Knee to Chest Stretch  - 1 x daily - 7 x weekly - 1 sets - 3 reps - 20 hold - Seated Hip Abduction with Resistance  - 1 x daily - 3-4 x weekly - 3 sets - 10-12 reps - Seated Hamstring Stretch  - 1 x daily - 7 x weekly - 3 sets - 30 hold - Supine Figure 4 Piriformis Stretch  - 1 x daily - 7 x weekly - 3 sets - 30 hold - Supine  Piriformis Stretch with Foot on Ground  - 1 x daily - 7 x weekly - 3 sets - 30 hold - Standing Hip Abduction with Counter Support  - 1 x daily - 3-4 x weekly - 3 sets - 10-12 reps - Long Sitting Calf Stretch with Strap  - 1 x daily - 7 x weekly - 3 sets - 30-60s hold - Long Sitting Ankle Pumps  - 1 x daily - 7 x weekly - 3 sets - 10-20 reps - Seated Heel Raise  - 1 x daily - 7 x weekly - 3 sets - 10-20 reps   Access Code: [email or web address] URL: [email or web address] Date: 12/20/2023 Prepared by: Veryl Gottron Natassia Guthridge  Exercises - Supine Single Knee to Chest Stretch  - 1 x daily - 7 x weekly - 1 sets - 3 reps - 20 hold - Seated Hip Abduction with Resistance  - 1 x daily - 7 x weekly - 3 sets - 10 reps - Seated Hamstring Stretch  - 1 x daily - 7 x weekly - 3 sets - 10 reps - 30 hold  Access Code: [email or web address] URL: [email or web address] Date: 12/18/2023 Prepared by: Lyda Samples  Exercises - Supine Hamstring Stretch with Strap  - 1 x daily - 7 x weekly - 1  sets - 3 reps - 20 hold - Supine Single Knee to Chest Stretch  - 1 x daily - 7 x weekly - 1 sets - 3 reps - 20 hold  ASSESSMENT:  CLINICAL IMPRESSION: Pt arrived to OPPT with a main focus on increasing R hip strength. Pt tolerated all interventions without exacerbation of pain in the R gluteal. PT educated patient on proper techniques for piriformis and sciatic nerve glides. Pt still presents with R gluteal pain with prolonged R SLS; PT encouraged adherence to abduction strengthening in order to reduce pain.  Stair way navigation presented with step to pattern, heavy reliance on rail support and pain with R SLS phases. Updated HEP to include additional interventions to address sciatic nerve tension. Pt endorsed reduced pain in L glute post session. Currently patient presents with decreased activity tolerance, LE strength, and pain in a radicular pattern. PT strongly recommends continued skilled PT in order to address her current  deficits and improve QoL.    OBJECTIVE IMPAIRMENTS: Abnormal gait, difficulty walking, decreased ROM, decreased strength, and pain.   ACTIVITY LIMITATIONS: carrying, lifting, bending, standing, squatting, and stairs  PARTICIPATION LIMITATIONS: cleaning and community activity  PERSONAL FACTORS: Age, Past/current experiences, and Time since onset of injury/illness/exacerbation are also affecting patient's functional outcome.   REHAB POTENTIAL: Good  CLINICAL DECISION MAKING: Stable/uncomplicated  EVALUATION COMPLEXITY: Low   GOALS: Goals reviewed with patient? No  SHORT TERM GOALS: Target date: 01/15/2024 Patient will report compliance to HEP without verbal cues from PT in order to demonstrate improvements with strength and pain.  Baseline: 12/18/23: Provided initial HEP Goal status: INITIAL  LONG TERM GOALS: Target date: 02/12/2024  Patient will report improvements in MMT by 1 grade in right hip flexion and abduction in order to demonstrate clinically significant improvements with descending stairways and LE strength.  Baseline: 12/18/2023: 3+/5  Goal status: INITIAL  2.  Patient will report improvements in LEFS by 9 points in order to demonstrate clinically significant improvements in LE function.  Baseline: 12/18/2023: 24/80  Goal status: INITIAL  3.  Patient will report reduction in worst pain score by 3 points on NPS in order to demonstrate clinically significant improvements in pain.  Baseline: 12/18/2023: 7/10  Goal status: INITIAL  4.  Patient will reduce 5XSTS < 12s in order to demonstrate improvements in LE strength and ability to perform squatting motion.   Baseline: 12/20/23: 16.73s (12.7  1.8 seconds age related norms) Goal status: INITIAL  PLAN:  PT FREQUENCY: 1-2x/week  PT DURATION: 8 weeks  PLANNED INTERVENTIONS: 97110-Therapeutic exercises, 97530- Therapeutic activity, 97112- Neuromuscular re-education, 97535- Self Care, 16109- Manual therapy, [phone]-  Gait training, Stair training, Cryotherapy, and Moist heat  PLAN FOR NEXT SESSION: Gluteal Strengthening, SLS activities, Manual Techniques PRN  Satira Curet PT, DPT Physical Therapist- [facility]  [facility]  02/05/2024, 12:59 PM

## 2024-02-07 ENCOUNTER — Ambulatory Visit

## 2024-02-07 DIAGNOSIS — M79662 Pain in left lower leg: Secondary | ICD-10-CM

## 2024-02-07 DIAGNOSIS — R262 Difficulty in walking, not elsewhere classified: Secondary | ICD-10-CM | POA: Diagnosis present

## 2024-02-07 DIAGNOSIS — M6281 Muscle weakness (generalized): Secondary | ICD-10-CM | POA: Diagnosis present

## 2024-02-07 NOTE — Therapy (Signed)
 OUTPATIENT PHYSICAL THERAPY LOWER EXTREMITY TREATMENT   Patient Name: Carolyn Sampson MRN: [id number] DOB:04-Jan-1961, 63 y.o., female Today's Date: 02/07/2024  END OF SESSION:  PT End of Session - 02/07/24 1431     Visit Number 6    Number of Visits 17    Date for PT Re-Evaluation 02/12/24    PT Start Time 1430    PT Stop Time 1510    PT Time Calculation (min) 40 min    Activity Tolerance Patient tolerated treatment well    Behavior During Therapy WFL for tasks assessed/performed              Past Medical History:  Diagnosis Date   Fatty liver    Pre-diabetes    Past Surgical History:  Procedure Laterality Date   ABDOMINAL HYSTERECTOMY     APPENDECTOMY     CESAREAN SECTION     CESAREAN SECTION     COLONOSCOPY WITH PROPOFOL  N/A 06/02/2021   Procedure: COLONOSCOPY WITH PROPOFOL ;  Surgeon: Selena Daily, MD;  Location: [facility];  Service: Gastroenterology;  Laterality: N/A;  SPANISH INTERPRETER   LAPAROSCOPIC HYSTERECTOMY     LAPAROSCOPIC OOPHERECTOMY Right    SHOULDER ARTHROSCOPY WITH ROTATOR CUFF REPAIR AND SUBACROMIAL DECOMPRESSION Left 08/22/2021   Procedure: SHOULDER ARTHROSCOPY WITH ROTATOR CUFF REPAIR AND SUBACROMIAL DECOMPRESSION;  Surgeon: Sammye Cristal, MD;  Location: [facility];  Service: Orthopedics;  Laterality: Left;   Patient Active Problem List   Diagnosis Date Noted   Screen for colon cancer    Muscle cramps 05/28/2018   Pre-diabetes 05/28/2018   Fatty liver 05/28/2018   Hair loss 05/28/2018   Sciatica 11/01/2015   S/P right oophorectomy 11/01/2015   S/P hysterectomy 11/01/2015    PCP: N/A  REFERRING PROVIDER: Rozann Cornell, PA-C  REFERRING DIAG: [phone] (ICD-10-CM) - Bilateral knee painM25.561,M25.562 (ICD-10-CM) - Bilateral knee pain  THERAPY DIAG:  Pain in left lower leg  Muscle weakness (generalized)  Difficulty in walking, not elsewhere classified  Rationale for Evaluation and  Treatment: Rehabilitation  ONSET DATE: 09/04/2023  SUBJECTIVE:   SUBJECTIVE STATEMENT: Patient reports numbness and tingling in the R leg along the anterior and posterior aspect. Current pain 0/10 in the R leg. Pt reports upcoming surgery scheduled next week to address R foot pain. No additional questions or concerns.   Interpreter: [employer]  PERTINENT HISTORY: Pt well known to PT clinic. Reporting BLE symptoms beginning in hips to her knees. Reports pain as deep and achey pointing to her hamstring region. Worst pain has been a 6-7/10 NPS. Pain is better at rest in sitting. Pain described beginning in her foot, all the way up to her hip in lateral aspect. Also has pain at night with hip positioning which seems to be described as hip ER. Denies N/T in LE's.   PAIN:  Are you having pain? Yes: NPRS scale: 0/10 NPS Pain location: Left Greater Trochanter, left gluteal muscles, posterior left knee  Pain description: Jones Ness  Aggravating factors: Walking, Descending Stairs, Squatting Relieving factors: Sitting, Rest  PRECAUTIONS: None  RED FLAGS: None   WEIGHT BEARING RESTRICTIONS: No  FALLS:  Has patient fallen in last 6 months? Yes. Number of falls 1  LIVING ENVIRONMENT: Lives with: lives alone Stairs: Yes: Internal: 10 steps; bilateral but cannot reach both  OCCUPATION:   PLOF: Independent  PATIENT GOALS: Reduce pain and improve LE strength.   OBJECTIVE:  Note: Objective measures were completed at Evaluation unless otherwise noted.  DIAGNOSTIC FINDINGS: N/A  PATIENT SURVEYS:  LEFS 24/80  COGNITION: Overall cognitive status: Within functional limits for tasks assessed     SENSATION: Light touch: WFL  EDEMA:  Not Assessed  MUSCLE LENGTH: Hamstrings (SLR): Right 175 deg; Left 160 deg  POSTURE: rounded shoulders  PALPATION: TTP along semi tendinosis on R/L. Also TTP along RLE IT band  LUMBAR AROM: Flexion:  100% Extension: 100% Lateral flexion R/L: 100%/50%*   LOWER EXTREMITY ROM:  Active ROM Right eval Left eval  Hip flexion [facility]: [facility] Rhonda W   Hip extension    Hip abduction WFL* WFL  Hip adduction    Hip internal rotation [facility]  WFL  Hip external rotation [facility]  Knee flexion    Knee extension    Ankle dorsiflexion [facility] WFL  Ankle plantarflexion [facility]  WFL  Ankle inversion    Ankle eversion     (Blank rows = not tested)  LOWER EXTREMITY MMT:  MMT Right eval Left eval  Hip flexion 3+* 4  Hip extension    Hip abduction 3* 3  Hip adduction    Hip internal rotation 5 5  Hip external rotation 5 5  Knee flexion 5* 5  Knee extension 4* 4  Ankle dorsiflexion 5 5  Ankle plantarflexion 4* 5  Ankle inversion    Ankle eversion     (Blank rows = not tested)  LOWER EXTREMITY SPECIAL TESTS:  Hip special tests: Portia Brittle (FABER) test: positive , Trendelenburg test: positive , and Ober's test: negative  FADDIR: Negative bilat  FUNCTIONAL TESTS:  Lateral Step Down: Pain with LLE; Single Leg Squat: Pain with RLE   GAIT: Not assessed                                                                                                                                 TREATMENT DATE: 02/05/2024  There.ex:   Passive Supine Piriformis Stretch (RLE);   30s/bout x 3 in order to improve pain and tissue extensibility  - Education on proper technique for piriformis stretch (hand placement,over pressure force)  Supine Nerve Glide (Passive Hip Flexed to 90 Flexion)    2 x 10, VC for Dorsiflexion   - Education on proper technique for nerve glide with use of pillow sheet across thigh for support  Hooklying Hip Abduction   Against PT resistance 1  x 10 (mod resistance)   Against Blue TB 1 x 10   Supine SLR with Hip Abduction for hip flexor strength and abduction strength   2 x 10 AROM only  Supine Glute Bridges for gluteal strength, core stabilization and hip abduction    2 x 10 Green TB around  knees  Standing Lumbar Extension for pain modulation for radiating pain  1 x 10  Therapeutic Activities:  Partial Squats with Resistance    2 x 8, Red TB below knee, initial set with hand held support from PT   1 x  8, Green below knee   (Seated Rest breaks in between sets)      Lateral Stepping with Resistance in front of cubicle    2 x 16' Green TB around ankles, intermittent UE support on cubicle  2 x 16' Green TB around ankles, minor pain towards end of set   (Standing Rest break in between sets)   PATIENT EDUCATION:  Education details: HEP, Geneticist, molecular Protocol, Form and technique Person educated: Patient Education method: Explanation, Demonstration, and Handouts Education comprehension: verbalized understanding and returned demonstration  HOME EXERCISE PROGRAM: Access Code: [email or web address] URL: [email or web address] Date: 02/05/2024 Prepared by: Veryl Gottron Lovey Crupi  Exercises - Supine Single Knee to Chest Stretch  - 1 x daily - 7 x weekly - 1 sets - 3 reps - 20 hold - Seated Hip Abduction with Resistance  - 1 x daily - 3-4 x weekly - 3 sets - 10-12 reps - Seated Hamstring Stretch  - 1 x daily - 7 x weekly - 3 sets - 30 hold - Supine Piriformis Stretch with Foot on Ground  - 1 x daily - 7 x weekly - 3 sets - 30 hold - Seated Lumbar Extension  - 1 x daily - 7 x weekly - 3 sets - 8-10 reps - Standing Hip Abduction Kicks  - 1 x daily - 7 x weekly - 3 sets - 8-10 reps - Supine Piriformis Stretch  - 1-2 x daily - 7 x weekly - 3 sets - 30-60s hold - Mini Squat with Counter Support  - 1 x daily - 7 x weekly - 2-3 sets - 8-10 reps - Supine Sciatic Nerve Glide  - 1 x daily - 7 x weekly - 2-3 sets - 10 reps  Access Code: [email or web address] URL: [email or web address] Date: 01/09/2024 Prepared by: Veryl Gottron Makayleigh Poliquin  Exercises - Supine Single Knee to Chest Stretch  - 1 x daily - 7 x weekly - 1 sets - 3 reps - 20 hold - Seated Hip Abduction with Resistance  - 1 x daily - 3-4 x  weekly - 3 sets - 10-12 reps - Seated Hamstring Stretch  - 1 x daily - 7 x weekly - 3 sets - 30 hold - Supine Figure 4 Piriformis Stretch  - 1 x daily - 7 x weekly - 3 sets - 30 hold - Supine Piriformis Stretch with Foot on Ground  - 1 x daily - 7 x weekly - 3 sets - 30 hold - Standing Hip Abduction with Counter Support  - 1 x daily - 3-4 x weekly - 3 sets - 10-12 reps - Long Sitting Calf Stretch with Strap  - 1 x daily - 7 x weekly - 3 sets - 30-60s hold - Long Sitting Ankle Pumps  - 1 x daily - 7 x weekly - 3 sets - 10-20 reps - Seated Heel Raise  - 1 x daily - 7 x weekly - 3 sets - 10-20 reps   Access Code: [email or web address] URL: [email or web address] Date: 12/20/2023 Prepared by: Veryl Gottron Marlaina Coburn  Exercises - Supine Single Knee to Chest Stretch  - 1 x daily - 7 x weekly - 1 sets - 3 reps - 20 hold - Seated Hip Abduction with Resistance  - 1 x daily - 7 x weekly - 3 sets - 10 reps - Seated Hamstring Stretch  - 1 x daily - 7 x weekly - 3 sets - 10 reps - 30 hold  Access Code: [email or web address] URL: [email or web address] Date: 12/18/2023 Prepared by: Lyda Samples  Exercises -  Supine Hamstring Stretch with Strap  - 1 x daily - 7 x weekly - 1 sets - 3 reps - 20 hold - Supine Single Knee to Chest Stretch  - 1 x daily - 7 x weekly - 1 sets - 3 reps - 20 hold  ASSESSMENT:  CLINICAL IMPRESSION: Continued PT POC with a main focus on increasing R hip strength. Pt tolerated all interventions without additional pain in R hip, knee or foot. Pt continues to demonstrate decreased tolerance to repetitive SLS on  RLE. PT addressed numbness and tingling with passive supine sciatic nerve glides and piriformis stretching; pt endorsed improvements in pain. She has an upcoming surgery to address R foot pain. PT encouraged adherence to HEP in order to reduce symptoms. Time spent discussing POC with pt and her possible return to clinic to treat both hip and foot. Currently patient presents with  decreased activity tolerance, LE strength, and pain in a radicular pattern. PT strongly recommends continued skilled PT in order to address her current deficits and improve QoL.    OBJECTIVE IMPAIRMENTS: Abnormal gait, difficulty walking, decreased ROM, decreased strength, and pain.   ACTIVITY LIMITATIONS: carrying, lifting, bending, standing, squatting, and stairs  PARTICIPATION LIMITATIONS: cleaning and community activity  PERSONAL FACTORS: Age, Past/current experiences, and Time since onset of injury/illness/exacerbation are also affecting patient's functional outcome.   REHAB POTENTIAL: Good  CLINICAL DECISION MAKING: Stable/uncomplicated  EVALUATION COMPLEXITY: Low   GOALS: Goals reviewed with patient? No  SHORT TERM GOALS: Target date: 01/15/2024 Patient will report compliance to HEP without verbal cues from PT in order to demonstrate improvements with strength and pain.  Baseline: 12/18/23: Provided initial HEP Goal status: INITIAL  LONG TERM GOALS: Target date: 02/12/2024  Patient will report improvements in MMT by 1 grade in right hip flexion and abduction in order to demonstrate clinically significant improvements with descending stairways and LE strength.  Baseline: 12/18/2023: 3+/5  Goal status: INITIAL  2.  Patient will report improvements in LEFS by 9 points in order to demonstrate clinically significant improvements in LE function.  Baseline: 12/18/2023: 24/80  Goal status: INITIAL  3.  Patient will report reduction in worst pain score by 3 points on NPS in order to demonstrate clinically significant improvements in pain.  Baseline: 12/18/2023: 7/10  Goal status: INITIAL  4.  Patient will reduce 5XSTS < 12s in order to demonstrate improvements in LE strength and ability to perform squatting motion.   Baseline: 12/20/23: 16.73s (12.7  1.8 seconds age related norms) Goal status: INITIAL  PLAN:  PT FREQUENCY: 1-2x/week  PT DURATION: 8 weeks  PLANNED  INTERVENTIONS: 97110-Therapeutic exercises, 97530- Therapeutic activity, 97112- Neuromuscular re-education, 97535- Self Care, 16109- Manual therapy, [phone]- Gait training, Stair training, Cryotherapy, and Moist heat  PLAN FOR NEXT SESSION: Gluteal Strengthening, SLS activities, Manual Techniques PRN  Satira Curet PT, DPT Physical Therapist- [facility]  [facility]  02/07/2024, 2:32 PM

## 2024-02-12 ENCOUNTER — Ambulatory Visit

## 2024-02-12 DIAGNOSIS — M6281 Muscle weakness (generalized): Secondary | ICD-10-CM

## 2024-02-12 DIAGNOSIS — R262 Difficulty in walking, not elsewhere classified: Secondary | ICD-10-CM

## 2024-02-12 DIAGNOSIS — M79662 Pain in left lower leg: Secondary | ICD-10-CM | POA: Diagnosis not present

## 2024-02-12 NOTE — Therapy (Signed)
 OUTPATIENT PHYSICAL THERAPY LOWER EXTREMITY TREATMENT/DISCHARGE SUMMARY   Patient Name: Carolyn Sampson MRN: [id number] DOB:Nov 29, 1960, 63 y.o., female Today's Date: 02/12/2024  END OF SESSION:  PT End of Session - 02/12/24 1301     Visit Number 7    Number of Visits 17    Date for PT Re-Evaluation 02/12/24    PT Start Time 1301    PT Stop Time 1340    PT Time Calculation (min) 39 min    Activity Tolerance Patient tolerated treatment well    Behavior During Therapy WFL for tasks assessed/performed               Past Medical History:  Diagnosis Date   Fatty liver    Pre-diabetes    Past Surgical History:  Procedure Laterality Date   ABDOMINAL HYSTERECTOMY     APPENDECTOMY     CESAREAN SECTION     CESAREAN SECTION     COLONOSCOPY WITH PROPOFOL  N/A 06/02/2021   Procedure: COLONOSCOPY WITH PROPOFOL ;  Surgeon: Selena Daily, MD;  Location: [facility];  Service: Gastroenterology;  Laterality: N/A;  SPANISH INTERPRETER   LAPAROSCOPIC HYSTERECTOMY     LAPAROSCOPIC OOPHERECTOMY Right    SHOULDER ARTHROSCOPY WITH ROTATOR CUFF REPAIR AND SUBACROMIAL DECOMPRESSION Left 08/22/2021   Procedure: SHOULDER ARTHROSCOPY WITH ROTATOR CUFF REPAIR AND SUBACROMIAL DECOMPRESSION;  Surgeon: Sammye Cristal, MD;  Location: [facility];  Service: Orthopedics;  Laterality: Left;   Patient Active Problem List   Diagnosis Date Noted   Screen for colon cancer    Muscle cramps 05/28/2018   Pre-diabetes 05/28/2018   Fatty liver 05/28/2018   Hair loss 05/28/2018   Sciatica 11/01/2015   S/P right oophorectomy 11/01/2015   S/P hysterectomy 11/01/2015    PCP: N/A  REFERRING PROVIDER: Rozann Cornell, PA-C  REFERRING DIAG: [phone] (ICD-10-CM) - Bilateral knee painM25.561,M25.562 (ICD-10-CM) - Bilateral knee pain  THERAPY DIAG:  Pain in left lower leg  Muscle weakness (generalized)  Difficulty in walking, not elsewhere classified  Rationale for  Evaluation and Treatment: Rehabilitation  ONSET DATE: 09/04/2023  SUBJECTIVE:   SUBJECTIVE STATEMENT: Patient reports that she has upcoming foot surgery and her surgeon advised that patient should cancel all appts for month of may. Pt agreeable to discharge today prior to upcoming surgery for the foot. Pt reports tightness and feeling of a 7/10 in the R hip.   No other questions or concerns.   Interpreter:   PERTINENT HISTORY: Pt well known to PT clinic. Reporting BLE symptoms beginning in hips to her knees. Reports pain as deep and achey pointing to her hamstring region. Worst pain has been a 6-7/10 NPS. Pain is better at rest in sitting. Pain described beginning in her foot, all the way up to her hip in lateral aspect. Also has pain at night with hip positioning which seems to be described as hip ER. Denies N/T in LE's.   PAIN:  Are you having pain? Yes: NPRS scale: 0/10 NPS Pain location: Left Greater Trochanter, left gluteal muscles, posterior left knee  Pain description: [place]  Aggravating factors: Walking, Descending Stairs, Squatting Relieving factors: Sitting, Rest  PRECAUTIONS: None  RED FLAGS: None   WEIGHT BEARING RESTRICTIONS: No  FALLS:  Has patient fallen in last 6 months? Yes. Number of falls 1  LIVING ENVIRONMENT: Lives with: lives alone Stairs: Yes: Internal: 10 steps; bilateral but cannot reach both  OCCUPATION:   PLOF: Independent  PATIENT GOALS: Reduce pain and improve LE strength.  OBJECTIVE:  Note: Objective measures were completed at Evaluation unless otherwise noted.  DIAGNOSTIC FINDINGS: N/A  PATIENT SURVEYS:  LEFS 24/80  COGNITION: Overall cognitive status: Within functional limits for tasks assessed     SENSATION: Light touch: WFL  EDEMA:  Not Assessed  MUSCLE LENGTH: Hamstrings (SLR): Right 175 deg; Left 160 deg  POSTURE: rounded shoulders  PALPATION: TTP along semi tendinosis on R/L. Also TTP along RLE IT  band  LUMBAR AROM: Flexion: 100% Extension: 100% Lateral flexion R/L: 100%/50%*   LOWER EXTREMITY ROM:  Active ROM Right eval Left eval  Hip flexion [facility]   Hip extension    Hip abduction WFL* WFL  Hip adduction    Hip internal rotation [facility]  WFL  Hip external rotation [facility]  Knee flexion    Knee extension    Ankle dorsiflexion [facility] WFL  Ankle plantarflexion [facility]  WFL  Ankle inversion    Ankle eversion     (Blank rows = not tested)  LOWER EXTREMITY MMT:  MMT Right eval Left eval  Hip flexion 3+* 4  Hip extension    Hip abduction 3* 3  Hip adduction    Hip internal rotation 5 5  Hip external rotation 5 5  Knee flexion 5* 5  Knee extension 4* 4  Ankle dorsiflexion 5 5  Ankle plantarflexion 4* 5  Ankle inversion    Ankle eversion     (Blank rows = not tested)  LOWER EXTREMITY SPECIAL TESTS:  Hip special tests: Portia Brittle (FABER) test: positive , Trendelenburg test: positive , and Ober's test: negative  FADDIR: Negative bilat  FUNCTIONAL TESTS:  Lateral Step Down: Pain with LLE; Single Leg Squat: Pain with RLE   GAIT: Not assessed                                                                                                                                 TREATMENT DATE: 02/12/2024  Therapeutic Exercise:  5TSTS: 9.78s  Standing Hip Abduction   R/L: 2 x 10, Green TB   Hooklying Hip Abduction against resistance   2 x 12 Blue TB   Passive Piriformis Stretch  30s/bout x 5 in order to improve ROM and tissue extensibility  -Educated patient about partner stretch with daughter in order to elicit deeper stretch   Time spent at the end of session discussing HEP focused on improving hip strength and reducing sciatic nerve pain distribution. PT advised of hip exercises to perform in bed if given NWB orders for R foot.   Therapeutic Activities :  Partial Squats with Resistance    3 x 10 Green TB below knees (no pain reported in hip)     (Seated Rest  breaks in between sets)      Lateral Stepping with Resistance in front of cubicle    2 x 16' Green TB around ankles, intermittent UE support on cubicle  2 x 16' Red TB  around ankles 2 x 16' Red TB     (Standing Rest break in between sets)   PATIENT EDUCATION:  Education details: HEP, Stretch Protocol, Form and technique Person educated: Patient Education method: Explanation, Demonstration, and Handouts Education comprehension: verbalized understanding and returned demonstration  HOME EXERCISE PROGRAM: Access Code: [email or web address] URL: [email or web address] Date: 02/12/2024 Prepared by: Veryl Gottron Palmer Fahrner  Exercises - Supine Single Knee to Chest Stretch  - 1 x daily - 7 x weekly - 1 sets - 3 reps - 20 hold - Seated Hamstring Stretch  - 1 x daily - 7 x weekly - 3 sets - 30 hold - Supine Piriformis Stretch with Foot on Ground  - 1 x daily - 7 x weekly - 3 sets - 30 hold - Seated Lumbar Extension  - 1 x daily - 7 x weekly - 3 sets - 8-10 reps - Supine Piriformis Stretch  - 1-2 x daily - 7 x weekly - 3 sets - 30-60s hold - Supine Sciatic Nerve Glide  - 1 x daily - 7 x weekly - 2-3 sets - 10 reps - Standing Hip Abduction Kicks  - 1 x daily - 3-4 x weekly - 3 sets - 10-12 reps - Squat with Resistance at Thighs  - 1 x daily - 3-4 x weekly - 2-3 sets - 10-12 reps - Side Stepping with Resistance at Feet  - 1 x daily - 3-4 x weekly - 2-3 sets - 10-12 reps - Hooklying Clamshell with Resistance  - 1 x daily - 3-4 x weekly - 2-3 sets - 10-12 reps - Bridge with Hip Abduction and Resistance  - 1 x daily - 3-4 x weekly - 2-3 sets - 10-12 reps  Access Code: [email or web address] URL: [email or web address] Date: 02/05/2024 Prepared by: Veryl Gottron Kashaun Bebo  Exercises - Supine Single Knee to Chest Stretch  - 1 x daily - 7 x weekly - 1 sets - 3 reps - 20 hold - Seated Hip Abduction with Resistance  - 1 x daily - 3-4 x weekly - 3 sets - 10-12 reps - Seated Hamstring Stretch  - 1 x daily - 7 x  weekly - 3 sets - 30 hold - Supine Piriformis Stretch with Foot on Ground  - 1 x daily - 7 x weekly - 3 sets - 30 hold - Seated Lumbar Extension  - 1 x daily - 7 x weekly - 3 sets - 8-10 reps - Standing Hip Abduction Kicks  - 1 x daily - 7 x weekly - 3 sets - 8-10 reps - Supine Piriformis Stretch  - 1-2 x daily - 7 x weekly - 3 sets - 30-60s hold - Mini Squat with Counter Support  - 1 x daily - 7 x weekly - 2-3 sets - 8-10 reps - Supine Sciatic Nerve Glide  - 1 x daily - 7 x weekly - 2-3 sets - 10 reps  Access Code: [email or web address] URL: [email or web address] Date: 01/09/2024 Prepared by: Veryl Gottron Lucely Leard  Exercises - Supine Single Knee to Chest Stretch  - 1 x daily - 7 x weekly - 1 sets - 3 reps - 20 hold - Seated Hip Abduction with Resistance  - 1 x daily - 3-4 x weekly - 3 sets - 10-12 reps - Seated Hamstring Stretch  - 1 x daily - 7 x weekly - 3 sets - 30 hold - Supine Figure 4 Piriformis Stretch  - 1 x daily - 7 x weekly - 3 sets - 30 hold - Supine Piriformis Stretch with  Foot on Ground  - 1 x daily - 7 x weekly - 3 sets - 30 hold - Standing Hip Abduction with Counter Support  - 1 x daily - 3-4 x weekly - 3 sets - 10-12 reps - Long Sitting Calf Stretch with Strap  - 1 x daily - 7 x weekly - 3 sets - 30-60s hold - Long Sitting Ankle Pumps  - 1 x daily - 7 x weekly - 3 sets - 10-20 reps - Seated Heel Raise  - 1 x daily - 7 x weekly - 3 sets - 10-20 reps   Access Code: [email or web address] URL: [email or web address] Date: 12/20/2023 Prepared by: Veryl Gottron Arianie Couse  Exercises - Supine Single Knee to Chest Stretch  - 1 x daily - 7 x weekly - 1 sets - 3 reps - 20 hold - Seated Hip Abduction with Resistance  - 1 x daily - 7 x weekly - 3 sets - 10 reps - Seated Hamstring Stretch  - 1 x daily - 7 x weekly - 3 sets - 10 reps - 30 hold  Access Code: [email or web address] URL: [email or web address] Date: 12/18/2023 Prepared by: Lyda Samples  Exercises - Supine Hamstring Stretch  with Strap  - 1 x daily - 7 x weekly - 1 sets - 3 reps - 20 hold - Supine Single Knee to Chest Stretch  - 1 x daily - 7 x weekly - 1 sets - 3 reps - 20 hold  ASSESSMENT:  CLINICAL IMPRESSION: Session slightly shortened due to interpreter arriving late to session. Pt arrives to OPPT with a main focus on reassessment towards PT and personal goals. Pt still agreeable to d/c from PT given upcoming foot surgery. Pt tolerated all interventions without additional pain in R hip or knee. Patient self reported a lower score on LEFS since initial evaluation (24/80 to 16/80) indicating increased disability due to her hip pain. Patient did demonstrate improvements in MMT and 5TSTS (16.73s to 9.78s) indicating increased LE strength however she is still experiencing moderate to severe pain (based on recent worst pain score, see below). PT discharged pt at this time due to upcoming surgery for her R foot. PT provided comprehensive HEP at end of session in order to maintain progress hip strength. No question at end of session. Pt encouraged to follow-up with their primary care provider is recommended if any issues arise   OBJECTIVE IMPAIRMENTS: Abnormal gait, difficulty walking, decreased ROM, decreased strength, and pain.   ACTIVITY LIMITATIONS: carrying, lifting, bending, standing, squatting, and stairs  PARTICIPATION LIMITATIONS: cleaning and community activity  PERSONAL FACTORS: Age, Past/current experiences, and Time since onset of injury/illness/exacerbation are also affecting patient's functional outcome.   REHAB POTENTIAL: Good  CLINICAL DECISION MAKING: Stable/uncomplicated  EVALUATION COMPLEXITY: Low   GOALS: Goals reviewed with patient? No  SHORT TERM GOALS: Target date: 01/15/2024 Patient will report compliance to HEP without verbal cues from PT in order to demonstrate improvements with strength and pain.  Baseline: 12/18/23: Provided initial HEP; 02/12/2024: 100% adherence  Goal status:  MET  LONG TERM GOALS: Target date: 02/12/2024  Patient will report improvements in MMT by 1 grade in right hip flexion and abduction in order to demonstrate clinically significant improvements with descending stairways and LE strength.  Baseline: 12/18/2023: 3+/5; 02/12/2024: Hip Flexion: 4-/5, Hip Abduction (Seated):  4+/5 Goal status: MET   2.  Patient will report improvements in LEFS by 9 points in order to demonstrate clinically significant improvements in LE function.  Baseline: 12/18/2023: 24/80; 02/12/2024: 16 /  80 = 20.0 % Goal status: INITIAL  3.  Patient will report reduction in worst pain score by 3 points on NPS in order to demonstrate clinically significant improvements in pain.  Baseline: 12/18/2023: 7/10; 02/12/2024: 10/10 Goal status: NOT MET   4.  Patient will reduce 5XSTS < 12s in order to demonstrate improvements in LE strength and ability to perform squatting motion.   Baseline: 12/20/23: 16.73s (12.7  1.8 seconds age related norms) 02/12/2024: 9.78s Goal status: MET  PLAN:  PT FREQUENCY: 1-2x/week  PT DURATION: 8 weeks  PLANNED INTERVENTIONS: 97110-Therapeutic exercises, 97530- Therapeutic activity, 97112- Neuromuscular re-education, 97535- Self Care, [id number]- Manual therapy, [phone]- Gait training, Stair training, Cryotherapy, and Moist heat  PLAN FOR NEXT SESSION: Discharge  Satira Curet PT, DPT Physical Therapist- [facility]  [facility]  02/12/2024, 3:31 PM

## 2024-02-14 ENCOUNTER — Ambulatory Visit

## 2024-02-19 ENCOUNTER — Ambulatory Visit

## 2024-02-21 ENCOUNTER — Encounter

## 2024-02-26 ENCOUNTER — Encounter

## 2024-02-28 ENCOUNTER — Encounter
